# Patient Record
Sex: Female | Born: 1945 | Race: White | Hispanic: No | Marital: Single | Smoking: Never smoker
Health system: Southern US, Community
[De-identification: ages and names within clinical notes are randomized; demographics above are authoritative.]

## PROBLEM LIST (undated history)

## (undated) DIAGNOSIS — K219 Gastro-esophageal reflux disease without esophagitis: Secondary | ICD-10-CM

## (undated) DIAGNOSIS — M1990 Unspecified osteoarthritis, unspecified site: Secondary | ICD-10-CM

## (undated) DIAGNOSIS — E079 Disorder of thyroid, unspecified: Secondary | ICD-10-CM

## (undated) DIAGNOSIS — Z8669 Personal history of other diseases of the nervous system and sense organs: Secondary | ICD-10-CM

## (undated) DIAGNOSIS — I350 Nonrheumatic aortic (valve) stenosis: Secondary | ICD-10-CM

## (undated) DIAGNOSIS — G4733 Obstructive sleep apnea (adult) (pediatric): Secondary | ICD-10-CM

## (undated) DIAGNOSIS — I509 Heart failure, unspecified: Secondary | ICD-10-CM

## (undated) DIAGNOSIS — I10 Essential (primary) hypertension: Secondary | ICD-10-CM

## (undated) DIAGNOSIS — M542 Cervicalgia: Secondary | ICD-10-CM

## (undated) DIAGNOSIS — I059 Rheumatic mitral valve disease, unspecified: Secondary | ICD-10-CM

## (undated) DIAGNOSIS — D649 Anemia, unspecified: Secondary | ICD-10-CM

## (undated) DIAGNOSIS — Z9989 Dependence on other enabling machines and devices: Secondary | ICD-10-CM

## (undated) DIAGNOSIS — E785 Hyperlipidemia, unspecified: Secondary | ICD-10-CM

## (undated) DIAGNOSIS — R51 Headache: Secondary | ICD-10-CM

## (undated) HISTORY — DX: Cervicalgia: M54.2

## (undated) HISTORY — PX: ASCENDING AORTIC ANEURYSM REPAIR W/ TISSUE AORTIC VALVE REPLACEMENT: SHX1193

## (undated) HISTORY — PX: OTHER SURGICAL HISTORY: SHX169

## (undated) HISTORY — DX: Anemia, unspecified: D64.9

## (undated) HISTORY — DX: Headache: R51

## (undated) HISTORY — DX: Unspecified osteoarthritis, unspecified site: M19.90

## (undated) HISTORY — DX: Disorder of thyroid, unspecified: E07.9

## (undated) HISTORY — DX: Heart failure, unspecified: I50.9

## (undated) HISTORY — DX: Nonrheumatic aortic (valve) stenosis: I35.0

## (undated) HISTORY — DX: Hyperlipidemia, unspecified: E78.5

## (undated) HISTORY — DX: Personal history of other diseases of the nervous system and sense organs: Z86.69

## (undated) HISTORY — DX: Gastro-esophageal reflux disease without esophagitis: K21.9

## (undated) HISTORY — DX: Essential (primary) hypertension: I10

## (undated) HISTORY — DX: Obstructive sleep apnea (adult) (pediatric): Z99.89

## (undated) HISTORY — DX: Rheumatic mitral valve disease, unspecified: I05.9

## (undated) HISTORY — DX: Morbid (severe) obesity due to excess calories: E66.01

## (undated) HISTORY — DX: Obstructive sleep apnea (adult) (pediatric): G47.33

## (undated) HISTORY — PX: CHOLECYSTECTOMY: SHX55

---

## 2002-07-23 HISTORY — PX: CARDIAC CATHETERIZATION: SHX172

## 2002-08-23 HISTORY — PX: OTHER SURGICAL HISTORY: SHX169

## 2006-07-23 HISTORY — PX: CARDIAC CATHETERIZATION: SHX172

## 2006-10-03 ENCOUNTER — Ambulatory Visit: Payer: Self-pay | Admitting: Unknown Physician Specialty

## 2007-01-20 HISTORY — PX: OTHER SURGICAL HISTORY: SHX169

## 2007-12-04 ENCOUNTER — Ambulatory Visit: Payer: Self-pay | Admitting: Internal Medicine

## 2009-01-17 ENCOUNTER — Ambulatory Visit: Payer: Self-pay | Admitting: Internal Medicine

## 2009-02-04 ENCOUNTER — Ambulatory Visit: Payer: Self-pay | Admitting: Internal Medicine

## 2010-01-10 ENCOUNTER — Ambulatory Visit: Payer: Self-pay | Admitting: Unknown Physician Specialty

## 2010-12-05 LAB — TSH: TSH: 2.8 u[IU]/mL (ref <=5.90)

## 2011-04-10 LAB — BASIC METABOLIC PANEL
BUN: 16 mg/dL (ref 4–21)
Creatinine: 1 mg/dL (ref 0.5–1.1)

## 2011-04-10 LAB — LIPID PANEL
Cholesterol: 224 mg/dL — AB (ref 0–200)
LDL Cholesterol: 121 mg/dL
Triglycerides: 160 mg/dL (ref 40–160)

## 2011-04-10 LAB — CBC AND DIFFERENTIAL
HCT: 42 % (ref 36–46)
WBC: 7.2 10^3/mL

## 2011-04-10 LAB — POCT INR

## 2011-04-10 LAB — PROTIME-INR: Protime: 23 seconds — AB (ref 10.0–13.8)

## 2011-07-09 ENCOUNTER — Ambulatory Visit: Payer: Self-pay | Admitting: Internal Medicine

## 2011-08-10 ENCOUNTER — Ambulatory Visit: Payer: Self-pay | Admitting: Internal Medicine

## 2011-09-25 ENCOUNTER — Ambulatory Visit: Payer: Self-pay | Admitting: Internal Medicine

## 2011-10-05 ENCOUNTER — Encounter: Payer: Self-pay | Admitting: Internal Medicine

## 2011-10-08 ENCOUNTER — Encounter: Payer: Self-pay | Admitting: Internal Medicine

## 2011-10-08 DIAGNOSIS — G4733 Obstructive sleep apnea (adult) (pediatric): Secondary | ICD-10-CM | POA: Insufficient documentation

## 2011-10-08 DIAGNOSIS — E785 Hyperlipidemia, unspecified: Secondary | ICD-10-CM | POA: Insufficient documentation

## 2011-10-08 DIAGNOSIS — I5031 Acute diastolic (congestive) heart failure: Secondary | ICD-10-CM | POA: Insufficient documentation

## 2011-10-08 DIAGNOSIS — I359 Nonrheumatic aortic valve disorder, unspecified: Secondary | ICD-10-CM | POA: Insufficient documentation

## 2011-10-08 DIAGNOSIS — I059 Rheumatic mitral valve disease, unspecified: Secondary | ICD-10-CM

## 2011-10-08 DIAGNOSIS — I509 Heart failure, unspecified: Secondary | ICD-10-CM

## 2011-10-08 DIAGNOSIS — I10 Essential (primary) hypertension: Secondary | ICD-10-CM | POA: Insufficient documentation

## 2011-10-08 DIAGNOSIS — D649 Anemia, unspecified: Secondary | ICD-10-CM

## 2011-10-08 DIAGNOSIS — R519 Headache, unspecified: Secondary | ICD-10-CM | POA: Insufficient documentation

## 2011-10-08 DIAGNOSIS — G4710 Hypersomnia, unspecified: Secondary | ICD-10-CM

## 2011-10-08 DIAGNOSIS — E039 Hypothyroidism, unspecified: Secondary | ICD-10-CM | POA: Insufficient documentation

## 2011-10-08 DIAGNOSIS — R51 Headache: Secondary | ICD-10-CM

## 2011-11-20 ENCOUNTER — Ambulatory Visit (INDEPENDENT_AMBULATORY_CARE_PROVIDER_SITE_OTHER): Payer: BC Managed Care – PPO | Admitting: Internal Medicine

## 2011-11-20 ENCOUNTER — Encounter: Payer: Self-pay | Admitting: Internal Medicine

## 2011-11-20 ENCOUNTER — Encounter: Payer: Self-pay | Admitting: *Deleted

## 2011-11-20 VITALS — BP 142/76 | HR 65 | Temp 98.4°F | Resp 18 | Ht 67.0 in | Wt 321.2 lb

## 2011-11-20 DIAGNOSIS — R5381 Other malaise: Secondary | ICD-10-CM

## 2011-11-20 DIAGNOSIS — E66813 Obesity, class 3: Secondary | ICD-10-CM

## 2011-11-20 DIAGNOSIS — N951 Menopausal and female climacteric states: Secondary | ICD-10-CM

## 2011-11-20 DIAGNOSIS — Z7901 Long term (current) use of anticoagulants: Secondary | ICD-10-CM

## 2011-11-20 DIAGNOSIS — E785 Hyperlipidemia, unspecified: Secondary | ICD-10-CM

## 2011-11-20 DIAGNOSIS — I059 Rheumatic mitral valve disease, unspecified: Secondary | ICD-10-CM

## 2011-11-20 DIAGNOSIS — Z780 Asymptomatic menopausal state: Secondary | ICD-10-CM

## 2011-11-20 DIAGNOSIS — R5383 Other fatigue: Secondary | ICD-10-CM

## 2011-11-20 DIAGNOSIS — R32 Unspecified urinary incontinence: Secondary | ICD-10-CM

## 2011-11-20 DIAGNOSIS — E039 Hypothyroidism, unspecified: Secondary | ICD-10-CM

## 2011-11-20 DIAGNOSIS — F22 Delusional disorders: Secondary | ICD-10-CM | POA: Insufficient documentation

## 2011-11-20 LAB — LIPID PANEL
Cholesterol: 216 mg/dL — ABNORMAL HIGH (ref 0–200)
HDL: 82.9 mg/dL (ref >39.00)
Triglycerides: 126 mg/dL (ref 0.0–149.0)

## 2011-11-20 LAB — PROTIME-INR
INR: 1.5 ratio — ABNORMAL HIGH (ref 0.8–1.0)
Prothrombin Time: 16.6 s — ABNORMAL HIGH (ref 10.2–12.4)

## 2011-11-20 MED ORDER — SOLIFENACIN SUCCINATE 10 MG PO TABS: 5.0000 mg | ORAL_TABLET | Freq: Every day | ORAL | Status: DC

## 2011-11-20 MED ORDER — DESVENLAFAXINE SUCCINATE ER 50 MG PO TB24: 100.0000 mg | ORAL_TABLET | Freq: Every day | ORAL | Status: DC

## 2011-11-20 MED ORDER — OMEPRAZOLE 20 MG PO CPDR: 20.0000 mg | DELAYED_RELEASE_CAPSULE | Freq: Every day | ORAL | Status: DC

## 2011-11-20 NOTE — Progress Notes (Signed)
Patient ID: Maria Torres, female   DOB: 22-Mar-1946, 66 y.o.   MRN: [id number]  Patient Active Problem List  Diagnoses  . Headache  . Hypothyroidism  . Aortic valve disorder  . Benign hypertension  . Mitral valve disorder  . Anemia  . Hyperlipidemia  . Hypersomnia with sleep apnea  . CHF (congestive heart failure)  . Obesity, Class III, BMI 40-49.9 (morbid obesity)  . Post menopausal syndrome    Subjective:  CC:   Chief Complaint  Patient presents with  . New Patient    HPI:   Maria Reardonis a 66 y.o. female who presents To establish care, transferring from [facility]. .She has many unresolved medical issues including weight gain, Post menopausal syndrome with total loss of libido, which is causing estrangement from longterm boyfriend,  and mild urinary incontinence. She recalls a prior trial of Premarin, not sure why she stopped it.  Has noticed decreased memory since her aortic valve repair and aneurysm repair in 2009 at [facility] .  Mother Myana Schlup died 07-05-2011at home with Hospice  and she has gained 30 lbs since then .   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  History of lap band surgery in 1996 which was unsuccessful, trial stages ,  Done in [place] .  Taking celexa and wellbutrin per Dr.Jeffries for mood disorder , but has taken both ffor over 10 years without a drug holiday.    Past Medical History  Diagnosis Date  . Morbid obesity   . Hx of glaucoma   . Thyroid disease     hypothyrodism  . GERD (gastroesophageal reflux disease)   . Hypertension   . Aortic stenosis   . 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 Obstructive sleep apnea on CPAP   . Anemia   . Headache   . Cervicalgia   . Mitral valve disorder   . Hyperlipidemia   . CHF (congestive heart failure)     Past Surgical History  Procedure Date  .  Cholecystectomy   . Bilateral arthroscopic knee surgery 2001 & 2002  . Gastric banding surgery     @ [place] 04-16-96  . Unilateral total knee replacement 08/2002  . Total left hip replacement 01/25/07  . 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    Review of Systems:   12 Pt  review of systems was negative except those addressed in the HPI,     History   Social History  . Marital Status: Single    Spouse Name: N/A    Number of Children: N/A  . Years of Education: N/A   Occupational History  . Not on file.   Social History Main Topics  . Smoking status: Never Smoker   . Smokeless tobacco: Never Used  . Alcohol Use: Yes  . Drug Use: No  . Sexually Active: Not on file   Other Topics Concern  . Not on file   Social History Narrative  . No narrative on file    Objective:  BP 142/76  Pulse 65  Temp(Src) 98.4 F (36.9 C) (Oral)  Resp 18  Ht 5\' 7"  (1.702 m)  Wt  321 lb 4 oz (145.718 kg)  BMI 50.31 kg/m2  SpO2 97%  General appearance: alert, cooperative and appears stated age Ears: normal TM's and external ear canals both ears Throat: lips, mucosa, and tongue normal; teeth and gums normal Neck: no adenopathy, no carotid bruit, supple, symmetrical, trachea midline and thyroid not enlarged, symmetric, no tenderness/mass/nodules Back: symmetric, no curvature. ROM normal. No CVA tenderness. Lungs: clear to auscultation bilaterally Heart: regular rate and rhythm, S1, S2 normal, no murmur, click, rub or gallop Abdomen: soft, non-tender; bowel sounds normal; no masses,  no organomegaly Pulses: 2+ and symmetric Skin: Skin color, texture, turgor normal. No rashes or lesions Lymph nodes: Cervical, supraclavicular, and axillary nodes normal.  Assessment and Plan:  Mitral valve  disorder Benign, currently asymptomatic.   Obesity, Class III, BMI 40-49.9 (morbid obesity) I have addressed  BMI and recommended a low glycemic index diet utilizing smaller more frequent meals to increase metabolism.  I have also recommended that patient start exercising with a goal of 30 minutes of aerobic exercise a minimum of 5 days per week. Screening for lipid disorders, thyroid and diabetes to be done today. 30 minutes spent addressing diet, lifestyle and exercise .     Post menopausal syndrome Discussed current symptoms.  Current therapy.  Would avoid HRT in her given her BMI and increased risk for pulmonary embolism . Discussed weaning off of celexa and trial of pristiq.  Samples of 50 mg x 2 weeks given.     Updated Medication List Outpatient Encounter Prescriptions as of 11/20/2011  Medication Sig Dispense Refill  . ALPRAZolam (XANAX) 0.25 MG tablet Take 0.25 mg by mouth at bedtime as needed.      Marland Kitchen amLODipine (NORVASC) 10 MG tablet Take 10 mg by mouth daily.      Marland Kitchen amoxicillin (AMOXIL) 250 MG capsule Take 250 mg by mouth 3 (three) times daily. Before dental procedures      . buPROPion (WELLBUTRIN XL) 300 MG 24 hr tablet Take 300 mg by mouth daily.      . citalopram (CELEXA) 40 MG tablet Take 40 mg by mouth daily.      . hydrochlorothiazide (HYDRODIURIL) 25 MG tablet Take 25 mg by mouth daily.      Marland Kitchen levothyroxine (SYNTHROID) 125 MCG tablet Take 125 mcg by mouth daily.      Marland Kitchen omeprazole (PRILOSEC) 20 MG capsule Take 1 capsule (20 mg total) by mouth daily.  30 capsule  3  . solifenacin (VESICARE) 10 MG tablet Take 0.5 tablets (5 mg total) by mouth daily.  30 tablet  3  . traMADol (ULTRAM) 50 MG tablet Take 50 mg by mouth every 6 (six) hours as needed.      . warfarin (COUMADIN) 10 MG tablet Take 10 mg by mouth daily.      Marland Kitchen warfarin (COUMADIN) 7.5 MG tablet Take 7.5 mg by mouth daily. 7.5 mg 4 times a week, 10 mg 3 times a week      . zolpidem (AMBIEN) 5 MG tablet Take 5 mg by  mouth at bedtime as needed.      Marland Kitchen DISCONTD: omeprazole (PRILOSEC) 20 MG capsule Take 20 mg by mouth daily.      Marland Kitchen DISCONTD: solifenacin (VESICARE) 10 MG tablet Take 5 mg by mouth daily.      Marland Kitchen desvenlafaxine (PRISTIQ) 50 MG 24 hr tablet Take 2 tablets (100 mg total) by mouth daily.  30 tablet  11  . DISCONTD: docusate sodium (COLACE) 100 MG capsule  Take 100 mg by mouth 2 (two) times daily.      Marland Kitchen DISCONTD: oxyCODONE-acetaminophen (PERCOCET) 5-325 MG per tablet Take 1 tablet by mouth every 4 (four) hours as needed.         Orders Placed This Encounter  Procedures  . TSH  . Lipid panel  . COMPLETE METABOLIC PANEL WITH GFR  . INR/PT    No Follow-up on file.

## 2011-11-20 NOTE — Assessment & Plan Note (Signed)
Benign, currently asymptomatic.

## 2011-11-20 NOTE — Patient Instructions (Addendum)
Consider the Low Glycemic Index Diet and eating 6 smaller meals daily .  This boosts your metabolism and regulates your sugars:   7 AM Low carbohydrate Protein  Shakes (EAS Carb Control  Or Atkins ,  Available everywhere,   In  cases at [employer] )  2.5 carbs  (Add or substitute a toasted sandwhich thin w/ peanut butter)  10 AM: Protein bar by Atkins (snack size,  Chocolate lover's variety at  [employer])    Lunch: sandwich on pita bread or flatbread (Joseph's makes a pita bread and a flat bread , available at [employer] and [employer]; Toufayah makes a low carb flatbread available at [employer] and [employer]) Mission makes a low carb whole wheat tortilla available at [employer] most grocery stores   3 PM:  Mid day :  Another protein bar,  Or a  cheese stick, 1/4 cup of almonds, walnuts, pistachios, pecans, peanuts,  Macadamia nuts  6 PM  Dinner:  "mean and green:"  Meat/chicken/fish, salad, and green veggie : use ranch, vinagrette,  Blue cheese, etc  9 PM snack : Breyer's low carb fudgsicle or  ice cream bar (Carb Smart), or  Weight Watcher's ice cream bar , or another protein shake  Hearts of palm,  chobani greek yogurt ,  Carnitas (pork based ,  [employer]Js deli section), rotisserie chicken.    Decrease celexa to 20 mg daily ,  After one week start Pristiq at 50  mg daily and after one week stop the celexa  And continue pristiq at 50 mg daily

## 2011-11-20 NOTE — Assessment & Plan Note (Signed)
I have addressed  BMI and recommended a low glycemic index diet utilizing smaller more frequent meals to increase metabolism.  I have also recommended that patient start exercising with a goal of 30 minutes of aerobic exercise a minimum of 5 days per week. Screening for lipid disorders, thyroid and diabetes to be done today. 30 minutes spent addressing diet, lifestyle and exercise .

## 2011-11-20 NOTE — Assessment & Plan Note (Signed)
Discussed current symptoms.  Current therapy.  Would avoid HRT in her given her BMI and increased risk for pulmonary embolism . Discussed weaning off of celexa and trial of pristiq.  Samples of 50 mg x 2 weeks given.

## 2011-11-21 ENCOUNTER — Encounter: Payer: Self-pay | Admitting: Internal Medicine

## 2011-11-21 LAB — LDL CHOLESTEROL, DIRECT: Direct LDL: 122.8 mg/dL

## 2011-11-21 MED ORDER — LEVOTHYROXINE SODIUM 137 MCG PO TABS: 137.0000 ug | ORAL_TABLET | Freq: Every day | ORAL | Status: DC

## 2011-11-21 NOTE — Progress Notes (Signed)
Addended by: Duncan Dull on: 11/21/2011 01:31 PM   Modules accepted: Orders

## 2011-11-22 LAB — COMPLETE METABOLIC PANEL WITH GFR
CO2: 27 mEq/L (ref 19–32)
Creat: 1.01 mg/dL (ref 0.50–1.10)
GFR, Est African American: 68 mL/min
GFR, Est Non African American: 59 mL/min — ABNORMAL LOW
Glucose, Bld: 88 mg/dL (ref 70–99)
Total Bilirubin: 0.5 mg/dL (ref 0.3–1.2)

## 2011-12-03 ENCOUNTER — Encounter: Payer: Self-pay | Admitting: Internal Medicine

## 2011-12-06 ENCOUNTER — Encounter: Payer: Self-pay | Admitting: Internal Medicine

## 2011-12-06 DIAGNOSIS — F329 Major depressive disorder, single episode, unspecified: Secondary | ICD-10-CM

## 2011-12-10 MED ORDER — DESVENLAFAXINE SUCCINATE ER 100 MG PO TB24: 100.0000 mg | ORAL_TABLET | Freq: Every day | ORAL | Status: DC

## 2011-12-11 ENCOUNTER — Telehealth: Payer: Self-pay | Admitting: Internal Medicine

## 2011-12-11 NOTE — Telephone Encounter (Signed)
Pt called   [facility] in [place] spings  [phone] The have sent prior aut on her rx pristic pt would like to get 100mg  instead of 50mg . Pt is completely out of meds. Please advise  Pt found her lost rx but [facility]  Pt canceled this prescription

## 2011-12-12 ENCOUNTER — Telehealth: Payer: Self-pay | Admitting: Internal Medicine

## 2011-12-12 MED ORDER — VENLAFAXINE HCL ER 75 MG PO CP24: 75.0000 mg | ORAL_CAPSULE | Freq: Every day | ORAL | Status: DC

## 2011-12-12 NOTE — Telephone Encounter (Signed)
We have to do a prior authorization for patients Pristiq, I just got this request yesterday afternoon.  Patient called and stated she is completley out of the medication and she needs something today.  She stated you tapered her off citalopram to go the Pristiq.  Please advise if you want to switch her to something else.

## 2011-12-12 NOTE — Telephone Encounter (Signed)
Patient notified

## 2011-12-12 NOTE — Telephone Encounter (Signed)
See other note

## 2011-12-12 NOTE — Telephone Encounter (Signed)
rx for effexor sent to pharmacy for one month while we wait for pristiq quthorization

## 2011-12-21 ENCOUNTER — Encounter: Payer: BC Managed Care – PPO | Admitting: Internal Medicine

## 2011-12-24 ENCOUNTER — Other Ambulatory Visit: Payer: Self-pay | Admitting: Internal Medicine

## 2011-12-25 ENCOUNTER — Telehealth: Payer: Self-pay | Admitting: Internal Medicine

## 2011-12-25 NOTE — Telephone Encounter (Signed)
Received notification patient needs prior authorization on Pristiq, form is in red folder to be signed.

## 2012-01-02 NOTE — Telephone Encounter (Signed)
Pristiq has been approved by insurance.  Patient and pharmacy notified.

## 2012-01-03 ENCOUNTER — Other Ambulatory Visit: Payer: Self-pay | Admitting: Internal Medicine

## 2012-01-10 ENCOUNTER — Other Ambulatory Visit: Payer: Self-pay | Admitting: Internal Medicine

## 2012-01-10 MED ORDER — HYDROCHLOROTHIAZIDE 25 MG PO TABS: 25.0000 mg | ORAL_TABLET | Freq: Every day | ORAL | Status: DC

## 2012-01-25 ENCOUNTER — Encounter: Payer: BC Managed Care – PPO | Admitting: Internal Medicine

## 2012-03-03 ENCOUNTER — Other Ambulatory Visit: Payer: Self-pay | Admitting: Internal Medicine

## 2012-03-05 ENCOUNTER — Other Ambulatory Visit: Payer: Self-pay | Admitting: Internal Medicine

## 2012-03-20 ENCOUNTER — Telehealth: Payer: Self-pay | Admitting: Internal Medicine

## 2012-03-20 NOTE — Telephone Encounter (Signed)
Caller: Camisha/Patient; Patient Name: Maria Torres; PCP: Duncan Dull (Adults only); Best Callback Phone Number: [phone] Onset- 2weeks per pt she has had a headache/migraine. Afebrile. She saw her Opthamologist (Dr. 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 She was not given anything for migraine. She had an Imitrex left but that did not help. She does have nausea with headache. Emergent s/s of Headache protocol r/o. Pt to see provider within 24hrs. Appointment scheduled with Dr. Dan Humphreys for tomorrow at 1:15pm. No appointments available with Dr. Darrick Huntsman.

## 2012-03-21 ENCOUNTER — Encounter: Payer: Self-pay | Admitting: Internal Medicine

## 2012-03-21 ENCOUNTER — Ambulatory Visit (INDEPENDENT_AMBULATORY_CARE_PROVIDER_SITE_OTHER): Payer: BC Managed Care – PPO | Admitting: Internal Medicine

## 2012-03-21 VITALS — BP 114/72 | HR 80 | Temp 98.0°F | Ht 67.0 in | Wt 321.5 lb

## 2012-03-21 DIAGNOSIS — R51 Headache: Secondary | ICD-10-CM

## 2012-03-21 DIAGNOSIS — Z7901 Long term (current) use of anticoagulants: Secondary | ICD-10-CM

## 2012-03-21 DIAGNOSIS — G43909 Migraine, unspecified, not intractable, without status migrainosus: Secondary | ICD-10-CM

## 2012-03-21 LAB — PROTIME-INR: Prothrombin Time: 24 s — ABNORMAL HIGH (ref 10.2–12.4)

## 2012-03-21 MED ORDER — PREDNISONE (PAK) 10 MG PO TABS: ORAL_TABLET | ORAL | Status: AC

## 2012-03-21 MED ORDER — PROMETHAZINE HCL 12.5 MG PO TABS: 12.5000 mg | ORAL_TABLET | Freq: Three times a day (TID) | ORAL | Status: DC | PRN

## 2012-03-21 MED ORDER — TOPIRAMATE 25 MG PO TABS: 25.0000 mg | ORAL_TABLET | Freq: Two times a day (BID) | ORAL | Status: DC

## 2012-03-21 MED ORDER — ZOLMITRIPTAN 5 MG NA SOLN: 1.0000 | NASAL | Status: DC | PRN

## 2012-03-21 MED ORDER — METHYLPREDNISOLONE SODIUM SUCC 125 MG IJ SOLR
62.5000 mg | Freq: Once | INTRAMUSCULAR | Status: AC
  Administered 2012-03-21: 62.5 mg via INTRAMUSCULAR

## 2012-03-21 NOTE — Patient Instructions (Signed)
Start Topamax tonight 25mg  every night today.  Start Prednisone taper back tomorrow.  Follow up 1-2 weeks.

## 2012-03-22 NOTE — Assessment & Plan Note (Signed)
Symptoms consistent with migraine. Symptoms improved with Zomig, but not completely resolved. Given that symptoms have been persistent for 2 weeks, will plan to try course of prednisone in effort to abort headache. Will also start Topamax as preventative measure. Follow up 2 weeks or sooner as needed.

## 2012-03-22 NOTE — Progress Notes (Signed)
Subjective:    Patient ID: Maria Torres, female    DOB: 07/24/1945, 66 y.o.   MRN: [id number]  HPI 66YO female with h/o migraines presents with 2 week h/o headache. Symptoms are consistent with previous migraines. Diffuse headache pain, aura, photo and phonophobia, nausea. Symptoms have not responded to OTC meds. Pt used immitrex in the past, but has not recently used this medication.   Outpatient Encounter Prescriptions as of 03/21/2012  Medication Sig Dispense Refill  . ALPRAZolam (XANAX) 0.25 MG tablet Take 0.25 mg by mouth at bedtime as needed.      Marland Kitchen amLODipine (NORVASC) 10 MG tablet take 1 tablet by mouth once daily  90 tablet  3  . amoxicillin (AMOXIL) 250 MG capsule Take 250 mg by mouth 3 (three) times daily. Before dental procedures      . buPROPion (WELLBUTRIN XL) 300 MG 24 hr tablet take 1 tablet by mouth once daily  90 tablet  3  . desvenlafaxine (PRISTIQ) 100 MG 24 hr tablet Take 1 tablet (100 mg total) by mouth daily.  30 tablet  11  . hydrochlorothiazide (HYDRODIURIL) 25 MG tablet Take 1 tablet (25 mg total) by mouth daily.  90 tablet  2  . levothyroxine (SYNTHROID) 137 MCG tablet Take 1 tablet (137 mcg total) by mouth daily.  30 tablet  5  . omeprazole (PRILOSEC) 20 MG capsule Take 1 capsule (20 mg total) by mouth daily.  30 capsule  3  . solifenacin (VESICARE) 10 MG tablet Take 0.5 tablets (5 mg total) by mouth daily.  30 tablet  3  . traMADol (ULTRAM) 50 MG tablet Take 50 mg by mouth every 6 (six) hours as needed.      . warfarin (COUMADIN) 10 MG tablet TAKE 1 TABLET BY MOUTH AS DIRECTED.  60 tablet  6  . warfarin (COUMADIN) 7.5 MG tablet Take 7.5 mg by mouth daily. 7.5 mg 4 times a week, 10 mg 3 times a week      . predniSONE (STERAPRED UNI-PAK) 10 MG tablet Take 60mg  day 1 then taper by 10mg  daily  21 tablet  0  . promethazine (PHENERGAN) 12.5 MG tablet Take 1 tablet (12.5 mg total) by mouth every 8 (eight) hours as needed for nausea.  20 tablet  0  . topiramate  (TOPAMAX) 25 MG tablet Take 1 tablet (25 mg total) by mouth 2 (two) times daily.  30 tablet  3  . zolmitriptan (ZOMIG) 5 MG nasal solution Place 1 spray into the nose as needed for migraine.  6 Units  0  . zolpidem (AMBIEN) 5 MG tablet Take 5 mg by mouth at bedtime as needed.      Marland Kitchen DISCONTD: citalopram (CELEXA) 40 MG tablet Take 40 mg by mouth daily.      Marland Kitchen DISCONTD: venlafaxine XR (EFFEXOR XR) 75 MG 24 hr capsule Take 1 capsule (75 mg total) by mouth daily.  30 capsule  1   Facility-Administered Encounter Medications as of 03/21/2012  Medication Dose Route Frequency Provider Last Rate Last Dose  . methylPREDNISolone sodium succinate (SOLU-MEDROL) 125 mg/2 mL injection 62.5 mg  62.5 mg Intramuscular Once Shelia Media, MD   62.5 mg at 03/21/12 1535   BP 114/72  Pulse 80  Temp 98 F (36.7 C) (Oral)  Ht 5\' 7"  (1.702 m)  Wt 321 lb 8 oz (145.831 kg)  BMI 50.35 kg/m2  SpO2 96%    Review of Systems  Constitutional: Negative for fever, chills, appetite change, fatigue and  unexpected weight change.  HENT: Negative for ear pain, congestion, sore throat, trouble swallowing, neck pain, voice change and sinus pressure.   Eyes: Positive for photophobia. Negative for visual disturbance.  Respiratory: Negative for cough, shortness of breath, wheezing and stridor.   Cardiovascular: Negative for chest pain, palpitations and leg swelling.  Gastrointestinal: Positive for nausea. Negative for vomiting, abdominal pain, diarrhea, constipation, blood in stool, abdominal distention and anal bleeding.  Genitourinary: Negative for dysuria and flank pain.  Musculoskeletal: Negative for myalgias, arthralgias and gait problem.  Skin: Negative for color change and rash.  Neurological: Positive for headaches. Negative for dizziness, tremors, speech difficulty, weakness and numbness.  Hematological: Negative for adenopathy. Does not bruise/bleed easily.  Psychiatric/Behavioral: Negative for suicidal ideas,  disturbed wake/sleep cycle and dysphoric mood. The patient is not nervous/anxious.        Objective:   Physical Exam  Constitutional: She is oriented to person, place, and time. She appears well-developed and well-nourished. No distress.  HENT:  Head: Normocephalic and atraumatic.  Right Ear: External ear normal.  Left Ear: External ear normal.  Nose: Nose normal.  Mouth/Throat: Oropharynx is clear and moist. No oropharyngeal exudate.  Eyes: Conjunctivae are normal. Pupils are equal, round, and reactive to light. Right eye exhibits no discharge. Left eye exhibits no discharge. No scleral icterus.  Neck: Normal range of motion. Neck supple. No tracheal deviation present. No thyromegaly present.  Cardiovascular: Normal rate, regular rhythm, normal heart sounds and intact distal pulses.  Exam reveals no gallop and no friction rub.   No murmur heard. Pulmonary/Chest: Effort normal and breath sounds normal. No respiratory distress. She has no wheezes. She has no rales. She exhibits no tenderness.  Musculoskeletal: Normal range of motion. She exhibits no edema and no tenderness.  Lymphadenopathy:    She has no cervical adenopathy.  Neurological: She is alert and oriented to person, place, and time. No cranial nerve deficit. She exhibits normal muscle tone. Coordination normal.  Skin: Skin is warm and dry. No rash noted. She is not diaphoretic. No erythema. No pallor.  Psychiatric: She has a normal mood and affect. Her behavior is normal. Judgment and thought content normal.          Assessment & Plan:

## 2012-03-26 ENCOUNTER — Telehealth: Payer: Self-pay | Admitting: Internal Medicine

## 2012-03-26 NOTE — Telephone Encounter (Signed)
Caller: Cythnia/Patient; Patient Name: Maria Torres; PCP: Duncan Dull (Adults only); Best Callback Phone Number: [phone].  Call regarding Insomnia after starting Prednisone for Migraines on 8-30. Patient isn't able to sleep during the night but is sleeping thru the day, unable to work.   All emergent symptoms ruled out per Sleep Disorders Protocol, see in 24 hours, due to sleep deprivation and irritable.  Appointment scheduled on 9-5 at 1130.  Patient verbalized understanding.

## 2012-03-27 ENCOUNTER — Encounter: Payer: Self-pay | Admitting: Internal Medicine

## 2012-03-27 ENCOUNTER — Ambulatory Visit (INDEPENDENT_AMBULATORY_CARE_PROVIDER_SITE_OTHER): Payer: BC Managed Care – PPO | Admitting: Internal Medicine

## 2012-03-27 VITALS — BP 132/82 | HR 82 | Temp 98.0°F | Ht 67.0 in | Wt 317.5 lb

## 2012-03-27 DIAGNOSIS — G43909 Migraine, unspecified, not intractable, without status migrainosus: Secondary | ICD-10-CM

## 2012-03-27 MED ORDER — TOPIRAMATE 50 MG PO TABS: 50.0000 mg | ORAL_TABLET | Freq: Two times a day (BID) | ORAL | Status: DC

## 2012-03-27 MED ORDER — ELETRIPTAN HYDROBROMIDE 40 MG PO TABS: 40.0000 mg | ORAL_TABLET | ORAL | Status: DC | PRN

## 2012-03-27 NOTE — Progress Notes (Signed)
Subjective:    Patient ID: Maria Torres, female    DOB: 1946/02/04, 66 y.o.   MRN: [id number]  HPI 66 year old female with history of migraine headaches presents for acute visit complaining of persistent migraine and insomnia. She was seen in clinic last week with severe migraine and was treated with Zomig with minimal improvement. She was then started on a prednisone taper to help break recurrent migraine headaches. She reports some improvement with this but has had difficulty sleeping. She reports that yesterday she did not fall asleep until after 5 in the morning. She then overslept her work and slept until 2 in the afternoon. She reports persistent diffuse headache with photo and phonophobia. She also has intermittent nausea. She was also started on Topamax but denies any improvement with this medication.  Outpatient Encounter Prescriptions as of 03/27/2012  Medication Sig Dispense Refill  . ALPRAZolam (XANAX) 0.25 MG tablet Take 0.25 mg by mouth at bedtime as needed.      Marland Kitchen amLODipine (NORVASC) 10 MG tablet take 1 tablet by mouth once daily  90 tablet  3  . amoxicillin (AMOXIL) 250 MG capsule Take 250 mg by mouth 3 (three) times daily. Before dental procedures      . buPROPion (WELLBUTRIN XL) 300 MG 24 hr tablet take 1 tablet by mouth once daily  90 tablet  3  . desvenlafaxine (PRISTIQ) 100 MG 24 hr tablet Take 1 tablet (100 mg total) by mouth daily.  30 tablet  11  . hydrochlorothiazide (HYDRODIURIL) 25 MG tablet Take 1 tablet (25 mg total) by mouth daily.  90 tablet  2  . levothyroxine (SYNTHROID) 137 MCG tablet Take 1 tablet (137 mcg total) by mouth daily.  30 tablet  5  . omeprazole (PRILOSEC) 20 MG capsule Take 1 capsule (20 mg total) by mouth daily.  30 capsule  3  . predniSONE (STERAPRED UNI-PAK) 10 MG tablet Take 60mg  day 1 then taper by 10mg  daily  21 tablet  0  . promethazine (PHENERGAN) 12.5 MG tablet Take 1 tablet (12.5 mg total) by mouth every 8 (eight) hours as needed for  nausea.  20 tablet  0  . solifenacin (VESICARE) 10 MG tablet Take 0.5 tablets (5 mg total) by mouth daily.  30 tablet  3  . topiramate (TOPAMAX) 50 MG tablet Take 1 tablet (50 mg total) by mouth 2 (two) times daily.  30 tablet  3  . traMADol (ULTRAM) 50 MG tablet Take 50 mg by mouth every 6 (six) hours as needed.      . warfarin (COUMADIN) 10 MG tablet TAKE 1 TABLET BY MOUTH AS DIRECTED.  60 tablet  6  . warfarin (COUMADIN) 7.5 MG tablet Take 7.5 mg by mouth daily. 7.5 mg 4 times a week, 10 mg 3 times a week      . zolmitriptan (ZOMIG) 5 MG nasal solution Place 1 spray into the nose as needed for migraine.  6 Units  0  . zolpidem (AMBIEN) 5 MG tablet Take 5 mg by mouth at bedtime as needed.      Marland Kitchen DISCONTD: topiramate (TOPAMAX) 25 MG tablet Take 1 tablet (25 mg total) by mouth 2 (two) times daily.  30 tablet  3  . eletriptan (RELPAX) 40 MG tablet One tablet by mouth at onset of headache. May repeat in 2 hours if headache persists or recurs. may repeat in 2 hours if necessary  10 tablet  0   BP 132/82  Pulse 82  Temp 98 F (  36.7 C) (Oral)  Ht 5\' 7"  (1.702 m)  Wt 317 lb 8 oz (144.017 kg)  BMI 49.73 kg/m2  SpO2 98%  Review of Systems  Constitutional: Positive for fatigue. Negative for fever, chills, appetite change and unexpected weight change.  HENT: Negative for ear pain, congestion, sore throat, trouble swallowing, neck pain, voice change and sinus pressure.   Eyes: Positive for photophobia. Negative for visual disturbance.  Respiratory: Negative for cough, shortness of breath, wheezing and stridor.   Cardiovascular: Negative for chest pain, palpitations and leg swelling.  Gastrointestinal: Positive for nausea. Negative for vomiting, abdominal pain, diarrhea, constipation, blood in stool, abdominal distention and anal bleeding.  Genitourinary: Negative for dysuria and flank pain.  Musculoskeletal: Negative for myalgias, arthralgias and gait problem.  Skin: Negative for color change and  rash.  Neurological: Positive for headaches. Negative for dizziness.  Hematological: Negative for adenopathy. Does not bruise/bleed easily.  Psychiatric/Behavioral: Positive for disturbed wake/sleep cycle. Negative for suicidal ideas and dysphoric mood. The patient is not nervous/anxious.        Objective:   Physical Exam  Constitutional: She is oriented to person, place, and time. She appears well-developed and well-nourished. No distress (sitting in dark room with sunglasses on).  HENT:  Head: Normocephalic and atraumatic.  Right Ear: External ear normal.  Left Ear: External ear normal.  Nose: Nose normal.  Mouth/Throat: Oropharynx is clear and moist.  Eyes: Conjunctivae are normal. Pupils are equal, round, and reactive to light. Right eye exhibits no discharge. Left eye exhibits no discharge. No scleral icterus.  Neck: Normal range of motion. Neck supple. No tracheal deviation present. No thyromegaly present.  Pulmonary/Chest: Effort normal.  Musculoskeletal: Normal range of motion. She exhibits no edema and no tenderness.  Lymphadenopathy:    She has no cervical adenopathy.  Neurological: She is alert and oriented to person, place, and time. No cranial nerve deficit. She exhibits normal muscle tone. Coordination normal.  Skin: Skin is warm and dry. No rash noted. She is not diaphoretic. No erythema. No pallor.  Psychiatric: She has a normal mood and affect. Her behavior is normal. Judgment and thought content normal.          Assessment & Plan:

## 2012-03-27 NOTE — Assessment & Plan Note (Addendum)
Recurrent migraine headaches despite use of prednisone taper (however note that pt was able to function and participate at work earlier this week). Will increase Topamax to 50 mg daily and then she will advance to twice daily next week. We will set up referral to headache clinic at [facility]. Will also try using Relpax to see if any improvement in acute symptoms. Followup with primary care physician next week. Will try to get notes from ophthalmologist in regards to recent eye exam and from [facility] as to previous MRI brain. If symptoms are persistent or worsening, pt will call. Would have low threshold to repeat MRI brain if symptoms persist.

## 2012-03-31 ENCOUNTER — Encounter: Payer: Self-pay | Admitting: Internal Medicine

## 2012-03-31 ENCOUNTER — Other Ambulatory Visit: Payer: Self-pay | Admitting: Internal Medicine

## 2012-04-04 ENCOUNTER — Encounter: Payer: BC Managed Care – PPO | Admitting: Internal Medicine

## 2012-04-16 ENCOUNTER — Other Ambulatory Visit: Payer: Self-pay | Admitting: Internal Medicine

## 2012-04-16 DIAGNOSIS — E039 Hypothyroidism, unspecified: Secondary | ICD-10-CM

## 2012-04-16 MED ORDER — LEVOTHYROXINE SODIUM 137 MCG PO TABS: 137.0000 ug | ORAL_TABLET | Freq: Every day | ORAL | Status: DC

## 2012-05-05 ENCOUNTER — Encounter: Payer: BC Managed Care – PPO | Admitting: Internal Medicine

## 2012-05-08 ENCOUNTER — Other Ambulatory Visit: Payer: Self-pay | Admitting: Internal Medicine

## 2012-05-30 ENCOUNTER — Encounter: Payer: BC Managed Care – PPO | Admitting: Internal Medicine

## 2012-06-26 ENCOUNTER — Other Ambulatory Visit: Payer: Self-pay | Admitting: Internal Medicine

## 2012-07-11 ENCOUNTER — Ambulatory Visit: Payer: Self-pay | Admitting: Internal Medicine

## 2012-07-11 ENCOUNTER — Ambulatory Visit (INDEPENDENT_AMBULATORY_CARE_PROVIDER_SITE_OTHER): Payer: BC Managed Care – PPO | Admitting: Internal Medicine

## 2012-07-11 ENCOUNTER — Encounter: Payer: Self-pay | Admitting: Internal Medicine

## 2012-07-11 VITALS — BP 140/72 | HR 76 | Temp 98.2°F | Resp 12 | Ht 68.0 in | Wt 330.5 lb

## 2012-07-11 DIAGNOSIS — I509 Heart failure, unspecified: Secondary | ICD-10-CM

## 2012-07-11 DIAGNOSIS — Z13220 Encounter for screening for lipoid disorders: Secondary | ICD-10-CM

## 2012-07-11 DIAGNOSIS — Z954 Presence of other heart-valve replacement: Secondary | ICD-10-CM

## 2012-07-11 DIAGNOSIS — R079 Chest pain, unspecified: Secondary | ICD-10-CM

## 2012-07-11 DIAGNOSIS — G4710 Hypersomnia, unspecified: Secondary | ICD-10-CM

## 2012-07-11 DIAGNOSIS — R0689 Other abnormalities of breathing: Secondary | ICD-10-CM

## 2012-07-11 DIAGNOSIS — Z952 Presence of prosthetic heart valve: Secondary | ICD-10-CM

## 2012-07-11 DIAGNOSIS — I359 Nonrheumatic aortic valve disorder, unspecified: Secondary | ICD-10-CM

## 2012-07-11 DIAGNOSIS — R0609 Other forms of dyspnea: Secondary | ICD-10-CM

## 2012-07-11 LAB — COMPREHENSIVE METABOLIC PANEL
ALT: 13 U/L (ref 0–35)
AST: 15 U/L (ref 0–37)
Alkaline Phosphatase: 50 U/L (ref 39–117)
CO2: 27 mEq/L (ref 19–32)
GFR: 55.9 mL/min — ABNORMAL LOW (ref >60.00)
Sodium: 141 mEq/L (ref 135–145)
Total Bilirubin: 0.8 mg/dL (ref 0.3–1.2)
Total Protein: 7.1 g/dL (ref 6.0–8.3)

## 2012-07-11 LAB — LIPID PANEL
Cholesterol: 192 mg/dL (ref 0–200)
Triglycerides: 63 mg/dL (ref 0.0–149.0)

## 2012-07-11 MED ORDER — FUROSEMIDE 20 MG PO TABS: ORAL_TABLET | ORAL | Status: DC

## 2012-07-11 NOTE — Patient Instructions (Addendum)
Resume coumadin at 10 mg daily for 3 days,  Then alternate between 10 mg and 7 .5 mg and recheck coumadin level in 2 weeks.   Please go get a chest x ray today   Cardiology referral underway  [facility] will contact you to set up the home INR testing

## 2012-07-11 NOTE — Progress Notes (Signed)
Patient ID: Maria Torres, female   DOB: 11/02/1945, 66 y.o.   MRN: [id number]  Patient Active Problem List  Diagnosis  . Headache  . Hypothyroidism  . Aortic valve disorder  . Benign hypertension  . Mitral valve disorder  . Anemia  . Hyperlipidemia  . Hypersomnia with sleep apnea  . CHF (congestive heart failure)  . Obesity, Class III, BMI 40-49.9 (morbid obesity)  . Post menopausal syndrome  . Migraine    Subjective:  CC:   Chief Complaint  Patient presents with  . Chest Pain    HPI:   Maria Reardonis a 66 y.o. female who presents the chief complaint of "i'm having a hard time breathing." Patient states that her symptoms start when she wakes up and are aggravated by just moving around. They have been occurring  for several weeks.  Sleeps sitting up regularly because of low back pain  But is more short of breath with supine position.  She has gained 30 lbs  over the past year and is now 330 lbs.  she denies chest pain.   she has had no recent travel.  Had some cold symptoms which predated the shortness of breath.   she has been told that she gurgles when she sleeps.  she has a history of obstructive sleep apnea but is not using her CPAP but has not done so for years. She has a history of aortic valve repair/replacement and has not taken her Coumadin in over a week. 2) She is also having groin pain on the left ,   which is aggravated by  prolonged standing  And stepping up.  She she climbs stairs by leading with right leg.   she had a left hip replacement 2008.    Past Medical History  Diagnosis Date  . Morbid obesity   . Hx of glaucoma   . Thyroid disease     hypothyrodism  . GERD (gastroesophageal reflux disease)   . Hypertension   . Aortic stenosis   . 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 Obstructive sleep apnea on CPAP   . Anemia   . Headache   . Cervicalgia   . Mitral valve disorder   . Hyperlipidemia   . CHF (congestive heart failure)     Past  Surgical History  Procedure Date  . Cholecystectomy   . Bilateral arthroscopic knee surgery 2001 & 2002  . Gastric banding surgery     @ [place] 04-16-96  . Unilateral total knee replacement 08/2002  . Total left hip replacement 01-20-2007  . 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    Review of Systems:   Patient denies headache, fevers, malaise, unintentional weight loss, skin rash, eye pain, sinus congestion and sinus pain, sore throat, dysphagia,  hemoptysis , cough, wheezing, chest pain, palpitations,   abdominal pain, nausea, melena, diarrhea, constipation, flank pain, dysuria, hematuria, urinary  Frequency, nocturia, numbness, tingling, seizures,  Focal weakness, Loss of consciousness,  Tremor, and suicidal ideation.        History   Social History  . Marital Status: Single    Spouse Name: N/A    Number of Children: N/A  . Years of Education: N/A   Occupational History  . Not on file.   Social History Main Topics  .  Smoking status: Never Smoker   . Smokeless tobacco: Never Used  . Alcohol Use: Yes  . Drug Use: No  . Sexually Active: Not on file   Other Topics Concern  . Not on file   Social History Narrative  . No narrative on file    Objective:  BP 140/72  Pulse 76  Temp 98.2 F (36.8 C) (Oral)  Resp 12  Ht 5\' 8"  (1.727 m)  Wt 330 lb 8 oz (149.914 kg)  BMI 50.25 kg/m2  SpO2 97%  General appearance: alert, cooperative and appears stated age Ears: normal TM's and external ear canals both ears Throat: lips, mucosa, and tongue normal; teeth and gums normal Neck: no adenopathy, no carotid bruit, supple, symmetrical, trachea midline and thyroid not enlarged, symmetric, no tenderness/mass/nodules Back: symmetric, no curvature. ROM normal. No CVA tenderness. Lungs: clear  to auscultation bilaterally Heart: regular rate and rhythm, S1, S2 normal, no murmur, click, rub or gallop Abdomen: soft, non-tender; bowel sounds normal; no masses,  no organomegaly Pulses: 2+ and symmetric Skin: Skin color, texture, turgor normal. No rashes or lesions Lymph nodes: Cervical, supraclavicular, and axillary nodes normal.  Assessment and Plan:  Aortic valve disorder She has a history of aortic aneurysm repair and tissue aortic valve replacement done at [facility] several years ago but has not had cardiology followup and has not been taking her Coumadin for the last week. I've asked her to resume it at 10 mg daily and to followup with a PT/INR in one week. Referral to local cardiology for followup.  CHF (congestive heart failure) Ideology unclear as I did not receive records from [place] , [place] unknown. She has untreated sleep apnea so most likely has pulmonary hypertension right-sided heart failure. Referral to cardiology for evaluation with echo and followup. BNP was less than 500 today but she is short of breath, has edema on exam and has pulmonary vascular congestion on chest x-ray done at at [facility]. Furosemide 20 mg daily started. Because of the left lower lobe atelectasis and right upper lobe atelectasis seen on CXR I have ordered a chest CT. She is at risk for PE due to morbid obesity.  Hypersomnia with sleep apnea  she has not been compliant with his her CPAP for many years and apparently was not aware that this could cause heart failure. We'll schedule her for a repeat sleep study.   Updated Medication List Outpatient Encounter Prescriptions as of 07/11/2012  Medication Sig Dispense Refill  . ALPRAZolam (XANAX) 0.25 MG tablet Take 0.25 mg by mouth at bedtime as needed.      Marland Kitchen amLODipine (NORVASC) 10 MG tablet take 1 tablet by mouth once daily  90 tablet  3  . amoxicillin (AMOXIL) 250 MG capsule Take 250 mg by mouth 3 (three) times daily. Before dental procedures      . buPROPion  (WELLBUTRIN XL) 300 MG 24 hr tablet take 1 tablet by mouth once daily  90 tablet  3  . eletriptan (RELPAX) 40 MG tablet One tablet by mouth at onset of headache. May repeat in 2 hours if headache persists or recurs. may repeat in 2 hours if necessary  10 tablet  0  . hydrochlorothiazide (HYDRODIURIL) 25 MG tablet Take 1 tablet (25 mg total) by mouth daily.  90 tablet  2  . levothyroxine (SYNTHROID, LEVOTHROID) 137 MCG tablet Take 1 tablet (137 mcg total) by mouth daily.  30 tablet  5  . omeprazole (PRILOSEC) 20 MG capsule Take 1 capsule (20  mg total) by mouth daily.  30 capsule  3  . promethazine (PHENERGAN) 12.5 MG tablet Take 1 tablet (12.5 mg total) by mouth every 8 (eight) hours as needed for nausea.  20 tablet  0  . topiramate (TOPAMAX) 50 MG tablet TAKE 1 TABLET BY MOUTH 2 TIMES A DAY  60 tablet  3  . traMADol (ULTRAM) 50 MG tablet Take 50 mg by mouth every 6 (six) hours as needed.      . warfarin (COUMADIN) 10 MG tablet TAKE 1 TABLET BY MOUTH AS DIRECTED.  60 tablet  6  . warfarin (COUMADIN) 7.5 MG tablet TAKE 1 TABLET BY MOUTH AS DIRECTED.  60 tablet  11  . zolmitriptan (ZOMIG) 5 MG nasal solution Place 1 spray into the nose as needed for migraine.  6 Units  0  . desvenlafaxine (PRISTIQ) 100 MG 24 hr tablet Take 1 tablet (100 mg total) by mouth daily.  30 tablet  11  . furosemide (LASIX) 20 MG tablet As needed for edema  30 tablet  3  . VESICARE 5 MG tablet take 1 tablet by mouth once daily  30 each  3  . zolpidem (AMBIEN) 5 MG tablet Take 5 mg by mouth at bedtime as needed.         Orders Placed This Encounter  Procedures  . DG Chest 2 View  . CT Chest W Contrast  . Comprehensive metabolic panel  . B Nat Peptide  . D-Dimer, Quantitative  . Lipid panel  . Ambulatory referral to Cardiology  . EKG 12-Lead    No Follow-up on file.

## 2012-07-14 ENCOUNTER — Encounter: Payer: Self-pay | Admitting: Internal Medicine

## 2012-07-14 ENCOUNTER — Telehealth: Payer: Self-pay | Admitting: Internal Medicine

## 2012-07-14 DIAGNOSIS — Z79899 Other long term (current) drug therapy: Secondary | ICD-10-CM

## 2012-07-14 DIAGNOSIS — Z7901 Long term (current) use of anticoagulants: Secondary | ICD-10-CM

## 2012-07-14 NOTE — Assessment & Plan Note (Signed)
She has a history of aortic aneurysm repair and tissue aortic valve replacement done at [facility] several years ago but has not had cardiology followup and has not been taking her Coumadin for the last week. I've asked her to resume it at 10 mg daily and to followup with a PT/INR in one week. Referral to local cardiology for followup.

## 2012-07-14 NOTE — Assessment & Plan Note (Addendum)
Ideology unclear as I did not receive records from [place] , [place] unknown. She has untreated sleep apnea so most likely has pulmonary hypertension right-sided heart failure. Referral to cardiology for evaluation with echo and followup. BNP was less than 500 today but she is short of breath, has edema on exam and has pulmonary vascular congestion on chest x-ray done at at [facility]. Furosemide 20 mg daily started. Because of the left lower lobe atelectasis and right upper lobe atelectasis seen on CXR I have ordered a chest CT. She is at risk for PE due to morbid obesity.

## 2012-07-14 NOTE — Telephone Encounter (Signed)
Pt left message today wanting amber to call her

## 2012-07-14 NOTE — Assessment & Plan Note (Signed)
she has not been compliant with his her CPAP for many years and apparently was not aware that this could cause heart failure. We'll schedule her for a repeat sleep study.

## 2012-07-15 ENCOUNTER — Telehealth: Payer: Self-pay | Admitting: Internal Medicine

## 2012-07-15 ENCOUNTER — Ambulatory Visit: Payer: Self-pay | Admitting: Internal Medicine

## 2012-07-15 NOTE — Telephone Encounter (Signed)
Patient wanting results from her CT Scan done on today 12.24.13.

## 2012-07-15 NOTE — Telephone Encounter (Signed)
See message that I sent earlier. CT showed congestive heart failure. No infection or blood clot. Recommend to continue with Lasix as Dr. Darrick Huntsman prescribed.

## 2012-07-15 NOTE — Telephone Encounter (Signed)
Pt.notified

## 2012-07-15 NOTE — Telephone Encounter (Signed)
Please make sure pt received this message:  This is Dr. Dan Humphreys covering for Dr. Darrick Huntsman. Your chest xray showed changes consistent with congestive heart failure, however no evidence of infection or blood clot.  I would recommend to continue the Furosemide as Dr. Darrick Huntsman prescribed.

## 2012-07-25 ENCOUNTER — Ambulatory Visit (INDEPENDENT_AMBULATORY_CARE_PROVIDER_SITE_OTHER): Payer: BC Managed Care – PPO | Admitting: Internal Medicine

## 2012-07-25 ENCOUNTER — Other Ambulatory Visit (HOSPITAL_COMMUNITY)
Admission: RE | Admit: 2012-07-25 | Discharge: 2012-07-25 | Disposition: A | Discharge status: Home / Self Care | Payer: BC Managed Care – PPO | Source: Ambulatory Visit | Attending: Internal Medicine | Admitting: Internal Medicine

## 2012-07-25 ENCOUNTER — Other Ambulatory Visit (INDEPENDENT_AMBULATORY_CARE_PROVIDER_SITE_OTHER): Payer: BC Managed Care – PPO

## 2012-07-25 ENCOUNTER — Encounter: Payer: Self-pay | Admitting: Internal Medicine

## 2012-07-25 VITALS — BP 124/82 | HR 78 | Temp 98.2°F | Resp 16 | Ht 68.0 in | Wt 314.0 lb

## 2012-07-25 DIAGNOSIS — Z0000 Encounter for general adult medical examination without abnormal findings: Secondary | ICD-10-CM

## 2012-07-25 DIAGNOSIS — Z79899 Other long term (current) drug therapy: Secondary | ICD-10-CM

## 2012-07-25 DIAGNOSIS — Z124 Encounter for screening for malignant neoplasm of cervix: Secondary | ICD-10-CM

## 2012-07-25 DIAGNOSIS — Z23 Encounter for immunization: Secondary | ICD-10-CM

## 2012-07-25 DIAGNOSIS — Z7901 Long term (current) use of anticoagulants: Secondary | ICD-10-CM

## 2012-07-25 DIAGNOSIS — Z01419 Encounter for gynecological examination (general) (routine) without abnormal findings: Secondary | ICD-10-CM | POA: Insufficient documentation

## 2012-07-25 DIAGNOSIS — Z1239 Encounter for other screening for malignant neoplasm of breast: Secondary | ICD-10-CM

## 2012-07-25 DIAGNOSIS — Z1151 Encounter for screening for human papillomavirus (HPV): Secondary | ICD-10-CM | POA: Insufficient documentation

## 2012-07-25 DIAGNOSIS — I509 Heart failure, unspecified: Secondary | ICD-10-CM

## 2012-07-25 DIAGNOSIS — I359 Nonrheumatic aortic valve disorder, unspecified: Secondary | ICD-10-CM

## 2012-07-25 LAB — CBC WITH DIFFERENTIAL/PLATELET
Basophils Absolute: 0.1 10*3/uL (ref 0.0–0.1)
Eosinophils Absolute: 0.2 10*3/uL (ref 0.0–0.7)
HCT: 42 % (ref 36.0–46.0)
Hemoglobin: 14 g/dL (ref 12.0–15.0)
Lymphs Abs: 1 10*3/uL (ref 0.7–4.0)
MCHC: 33.3 g/dL (ref 30.0–36.0)
MCV: 86.2 fl (ref 78.0–100.0)
Monocytes Relative: 7.1 % (ref 3.0–12.0)
Neutro Abs: 5.1 10*3/uL (ref 1.4–7.7)
RDW: 14.6 % (ref 11.5–14.6)

## 2012-07-25 LAB — BASIC METABOLIC PANEL
BUN: 18 mg/dL (ref 6–23)
GFR: 44.48 mL/min — ABNORMAL LOW (ref >60.00)
Glucose, Bld: 92 mg/dL (ref 70–99)
Potassium: 3.4 mEq/L — ABNORMAL LOW (ref 3.5–5.1)

## 2012-07-25 MED ORDER — ZOSTER VACCINE LIVE 19400 UNT/0.65ML ~~LOC~~ SOLR: 0.6500 mL | Freq: Once | SUBCUTANEOUS | Status: DC

## 2012-07-25 MED ORDER — NYSTATIN 100000 UNIT/GM EX POWD: Freq: Four times a day (QID) | CUTANEOUS | Status: DC

## 2012-07-25 NOTE — Progress Notes (Signed)
Patient ID: Maria Torres, female   DOB: Oct 31, 1945, 67 y.o.   MRN: [id number]    Subjective:     Maria Torres is a 67 y.o. female and is here for a comprehensive physical exam.  The patient reports no problems. She has lost 16 lbs since  last visit 2 weeks ago. she has been taking her furosemide and Coumadin daily and drinking increased amounts of water to curb her appetite. She has also modified her diet and has increased her intake of fresh fruits and yogurt. Her family has had a bit of a rallying effect around her since she was last seen and evaluated for pulmonary embolism and this was the Using diuretic  Daily ,,  Drinking lemon water   Yogurt with fresh fruit and granola.  Past Medical History  Diagnosis Date  . Morbid obesity   . Hx of glaucoma   . Thyroid disease     hypothyrodism  . GERD (gastroesophageal reflux disease)   . Hypertension   . Aortic stenosis   . 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 Obstructive sleep apnea on CPAP   . Anemia   . Headache   . Cervicalgia   . Mitral valve disorder   . Hyperlipidemia   . CHF (congestive heart failure)    Past Surgical History  Procedure Date  . Cholecystectomy   . Bilateral arthroscopic knee surgery 2001 & 2002  . Gastric banding surgery     @ [place] 04-16-96  . Unilateral total knee replacement 08/2002  . Total left hip replacement 01-20-2007  . Ascending aortic aneurysm repair w/ tissue aortic valve replacement    History   Social History  . Marital Status: Single    Spouse Name: N/A    Number of Children: N/A  . Years of Education: N/A   Occupational History  . Not on file.   Social History Main Topics  . Smoking status: Never Smoker   . Smokeless tobacco: Never Used  . Alcohol Use: Yes  . Drug Use: No  . Sexually Active: Not on file   Other Topics Concern  . Not on file   Social History Narrative  . No narrative on file   Health Maintenance  Topic Date Due  . Tetanus/tdap   07/12/1966  . Mammogram  07/12/1997  . Zostavax  07/13/2007  . Influenza Vaccine  03/23/2012  . Pneumococcal Polysaccharide Vaccine Age 35 And Over  07/12/2012  . Colonoscopy  04/24/2021    The following portions of the patient's history were reviewed and updated as appropriate: allergies, current medications, past family history, past medical history, past social history, past surgical history and problem list.  Review of Systems A comprehensive review of systems was negative.   Objective:     General appearance: morbidly obese, well groomed,  alert, cooperative and appears younger than stated age Head: Normocephalic, without obvious abnormality, atraumatic Eyes: conjunctivae/corneas clear. PERRL, EOM's intact. Fundi benign. Ears: normal TM's and external ear canals both ears Nose: Nares normal. Septum midline. Mucosa normal. No drainage or sinus tenderness. Throat: lips, mucosa, and tongue normal; teeth and gums normal Neck: no adenopathy, no carotid bruit, no JVD, supple, symmetrical, trachea midline and thyroid not enlarged, symmetric, no tenderness/mass/nodules Lungs: clear to auscultation bilaterally Chest wall: well healed medistinal scar  Breasts: pendulous, no masses or tenderness, No nipple retraction or dimpling, No axillary or supraclavicular adenopathy, Normal to palpation without dominant masses Heart: regular rate and rhythm, S1, S2  normal, systolic click of aortic valve noted, no murmur,  rub or gallop Abdomen: soft, non-tender; bowel sounds normal; no masses,  no organomegaly. Pelvic: adnexae not palpable, cervix normal in appearance, external genitalia normal, no adnexal masses or tenderness, no bladder tenderness, no cervical motion tenderness, rectovaginal septum normal, urethra without abnormality or discharge, uterus normal size, shape, and consistency and vagina normal without discharge Extremities:  Trace pitting edema extremities normal, atraumatic, no cyanosis    Pulses: 2+ and symmetric Skin: Skin color, texture, turgor normal. No rashes or lesions Neurologic: Alert and oriented X 3, normal strength and tone. Normal symmetric reflexes. Normal coordination and gait.    Assessment:    Aortic valve disorder She is status post aortic valve replacement and had a lapse in her Coumadin for several days prior to her last visit on December 20. Current INR is 2.1 on 10 mg daily. I will have her increase her total weekly dose to to 85 mg weekly  By taking 15 mg on Sundays Tuesdays and Thursday,, 10  Mg all other days and repeat PT/INR in one week Once  she has used up her current bottles,we will change her dose to a more uniform regimen depending on her next  PT/INR   CHF (congestive heart failure) And she cardiology in the next several weeks for echocardiogram and evaluation of both aortic and mitral valves. She's been taking furosemide daily since her last visit with a weight loss of 16 pounds and her edema has resolved. And stopping the daily furosemide now because her repeat potassium and creatinine have indicated mild dehydration. And hypokalemia. She will resume critical her Dyazide daily for management of hypertension.  Routine general medical examination at a health care facility Annual GYN and breast exam were done today. Identification of cervix was difficult due to patient's body habitus.   Updated Medication List Outpatient Encounter Prescriptions as of 07/25/2012  Medication Sig Dispense Refill  . ALPRAZolam (XANAX) 0.25 MG tablet Take 0.25 mg by mouth at bedtime as needed.      Marland Kitchen amLODipine (NORVASC) 10 MG tablet take 1 tablet by mouth once daily  90 tablet  3  . buPROPion (WELLBUTRIN XL) 300 MG 24 hr tablet take 1 tablet by mouth once daily  90 tablet  3  . eletriptan (RELPAX) 40 MG tablet One tablet by mouth at onset of headache. May repeat in 2 hours if headache persists or recurs. may repeat in 2 hours if necessary  10 tablet  0  . furosemide  (LASIX) 20 MG tablet As needed for edema  30 tablet  3  . hydrochlorothiazide (HYDRODIURIL) 25 MG tablet Take 1 tablet (25 mg total) by mouth daily.  90 tablet  2  . levothyroxine (SYNTHROID, LEVOTHROID) 137 MCG tablet Take 1 tablet (137 mcg total) by mouth daily.  30 tablet  5  . omeprazole (PRILOSEC) 20 MG capsule Take 1 capsule (20 mg total) by mouth daily.  30 capsule  3  . topiramate (TOPAMAX) 50 MG tablet TAKE 1 TABLET BY MOUTH 2 TIMES A DAY  60 tablet  3  . traMADol (ULTRAM) 50 MG tablet Take 50 mg by mouth every 6 (six) hours as needed.      . warfarin (COUMADIN) 10 MG tablet TAKE 1 TABLET BY MOUTH AS DIRECTED.  60 tablet  6  . warfarin (COUMADIN) 7.5 MG tablet TAKE 1 TABLET BY MOUTH AS DIRECTED.  60 tablet  11  . zolmitriptan (ZOMIG) 5 MG nasal solution Place  1 spray into the nose as needed for migraine.  6 Units  0  . amoxicillin (AMOXIL) 250 MG capsule Take 250 mg by mouth 3 (three) times daily. Before dental procedures      . desvenlafaxine (PRISTIQ) 100 MG 24 hr tablet Take 1 tablet (100 mg total) by mouth daily.  30 tablet  11  . nystatin (MYCOSTATIN) powder Apply topically 4 (four) times daily.  15 g  1  . potassium chloride (K-DUR,KLOR-CON) 10 MEQ tablet Take 2 tablets (20 mEq total) by mouth daily.  10 tablet  1  . promethazine (PHENERGAN) 12.5 MG tablet Take 1 tablet (12.5 mg total) by mouth every 8 (eight) hours as needed for nausea.  20 tablet  0  . VESICARE 5 MG tablet take 1 tablet by mouth once daily  30 each  3  . zolpidem (AMBIEN) 5 MG tablet Take 5 mg by mouth at bedtime as needed.      . zoster vaccine live, PF, (ZOSTAVAX) 21308 UNT/0.65ML injection Inject 19,400 Units into the skin once.  1 vial  0

## 2012-07-26 ENCOUNTER — Telehealth: Payer: Self-pay | Admitting: Internal Medicine

## 2012-07-26 DIAGNOSIS — Z0000 Encounter for general adult medical examination without abnormal findings: Secondary | ICD-10-CM | POA: Insufficient documentation

## 2012-07-26 MED ORDER — POTASSIUM CHLORIDE CRYS ER 10 MEQ PO TBCR: 20.0000 meq | EXTENDED_RELEASE_TABLET | Freq: Every day | ORAL | Status: DC

## 2012-07-26 NOTE — Assessment & Plan Note (Signed)
Annual GYN and breast exam were done today. Identification of cervix was difficult due to patient's body habitus.

## 2012-07-26 NOTE — Assessment & Plan Note (Signed)
She is status post aortic valve replacement and had a lapse in her Coumadin for several days prior to her last visit on December 20. Current INR is 2.1 on 10 mg daily. I will have her increase her total weekly dose to to 85 mg weekly  By taking 15 mg on Sundays Tuesdays and Thursday,, 10  Mg all other days and repeat PT/INR in one week Once  she has used up her current bottles,we will change her dose to a more uniform regimen depending on her next  PT/INR

## 2012-07-26 NOTE — Assessment & Plan Note (Signed)
And she cardiology in the next several weeks for echocardiogram and evaluation of both aortic and mitral valves. She's been taking furosemide daily since her last visit with a weight loss of 16 pounds and her edema has resolved. And stopping the daily furosemide now because her repeat potassium and creatinine have indicated mild dehydration. And hypokalemia. She will resume critical her Dyazide daily for management of hypertension.

## 2012-07-31 ENCOUNTER — Encounter: Payer: Self-pay | Admitting: Internal Medicine

## 2012-08-04 ENCOUNTER — Ambulatory Visit (INDEPENDENT_AMBULATORY_CARE_PROVIDER_SITE_OTHER): Payer: BC Managed Care – PPO | Admitting: Cardiovascular Disease

## 2012-08-04 ENCOUNTER — Encounter: Payer: Self-pay | Admitting: Cardiovascular Disease

## 2012-08-04 VITALS — BP 158/78 | HR 64 | Ht 68.0 in | Wt 321.5 lb

## 2012-08-04 DIAGNOSIS — I359 Nonrheumatic aortic valve disorder, unspecified: Secondary | ICD-10-CM

## 2012-08-04 DIAGNOSIS — I509 Heart failure, unspecified: Secondary | ICD-10-CM

## 2012-08-04 DIAGNOSIS — I10 Essential (primary) hypertension: Secondary | ICD-10-CM

## 2012-08-04 NOTE — Patient Instructions (Addendum)
Your physician has requested that you have an echocardiogram. Echocardiography is a painless test that uses sound waves to create images of your heart. It provides your doctor with information about the size and shape of your heart and how well your heart's chambers and valves are working. This procedure takes approximately one hour. There are no restrictions for this procedure.   Your physician wants you to follow-up in: 6 months with Dr. Arida. You will receive a reminder letter in the mail two months in advance. If you don't receive a letter, please call our office to schedule the follow-up appointment.  

## 2012-08-04 NOTE — Assessment & Plan Note (Signed)
Her blood pressure is mildly elevated today

## 2012-08-04 NOTE — Assessment & Plan Note (Signed)
I agree with with lifelong anticoagulation with a target INR between 2 and 3. An echocardiogram is requested for further evaluation of this given her recent symptoms of heart failure. The patient had no coronary artery disease on cardiac catheterization before her aortic valve replacement.

## 2012-08-04 NOTE — Progress Notes (Signed)
HPI  This is a pleasant 67 year old female who was referred by Dr. Darrick Huntsman for evaluation of dyspnea and lower extremity edema. The patient has known history of severe aortic stenosis due to bicuspid aortic valve as well as a descending aortic aneurysm. 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 Zebedee Iba. No cardiac events since then. She has been on warfarin. In mid December, she started complaining of increased dyspnea, lower extremity edema and weight gain. She was placed on furosemide 20 mg once daily with significant improvement in her symptoms. However, her renal function slightly worsened with hypokalemia. Thus, furosemide was stopped. She was already on hydrochlorothiazide 25 mg once daily. After she stopped furosemide, she started complaining again of increased dyspnea and lower extremity edema. She denies any chest pain, orthopnea or PND. The patient has known history of morbid obesity. She underwent unsuccessful lab band surgery many years ago. She also has history of sleep apnea on CPAP.  No Known Allergies   Current Outpatient Prescriptions on File Prior to Visit  Medication Sig Dispense Refill  . ALPRAZolam (XANAX) 0.25 MG tablet Take 0.25 mg by mouth at bedtime as needed.      Marland Kitchen amLODipine (NORVASC) 10 MG tablet take 1 tablet by mouth once daily  90 tablet  3  . amoxicillin (AMOXIL) 250 MG capsule Take 250 mg by mouth 3 (three) times daily. Before dental procedures      . buPROPion (WELLBUTRIN XL) 300 MG 24 hr tablet take 1 tablet by mouth once daily  90 tablet  3  . desvenlafaxine (PRISTIQ) 100 MG 24 hr tablet Take 1 tablet (100 mg total) by mouth daily.  30 tablet  11  . eletriptan (RELPAX) 40 MG tablet One tablet by mouth at onset of headache. May repeat in 2 hours if headache persists or recurs. may  repeat in 2 hours if necessary  10 tablet  0  . furosemide (LASIX) 20 MG tablet As needed for edema  30 tablet  3  . hydrochlorothiazide (HYDRODIURIL) 25 MG tablet Take 1 tablet (25 mg total) by mouth daily.  90 tablet  2  . levothyroxine (SYNTHROID, LEVOTHROID) 137 MCG tablet Take 1 tablet (137 mcg total) by mouth daily.  30 tablet  5  . nystatin (MYCOSTATIN) powder Apply topically 4 (four) times daily.  15 g  1  . omeprazole (PRILOSEC) 20 MG capsule Take 1 capsule (20 mg total) by mouth daily.  30 capsule  3  . potassium chloride (K-DUR,KLOR-CON) 10 MEQ tablet Take 2 tablets (20 mEq total) by mouth daily.  10 tablet  1  . promethazine (PHENERGAN) 12.5 MG tablet Take 1 tablet (12.5 mg total) by mouth every 8 (eight) hours as needed for nausea.  20 tablet  0  . topiramate (TOPAMAX) 50 MG tablet TAKE 1 TABLET BY MOUTH 2 TIMES A DAY  60 tablet  3  . traMADol (ULTRAM) 50 MG tablet Take 50 mg by mouth every 6 (six) hours as needed.      . VESICARE 5 MG tablet take 1 tablet by mouth once daily  30 each  3  . warfarin (COUMADIN) 10 MG tablet TAKE 1 TABLET BY MOUTH AS DIRECTED.  60 tablet  6  . warfarin (COUMADIN) 7.5 MG tablet TAKE 1 TABLET BY MOUTH  AS DIRECTED.  60 tablet  11  . zolmitriptan (ZOMIG) 5 MG nasal solution Place 1 spray into the nose as needed for migraine.  6 Units  0  . zolpidem (AMBIEN) 5 MG tablet Take 5 mg by mouth at bedtime as needed.      . zoster vaccine live, PF, (ZOSTAVAX) 16109 UNT/0.65ML injection Inject 19,400 Units into the skin once.  1 vial  0  . [DISCONTINUED] solifenacin (VESICARE) 10 MG tablet Take 0.5 tablets (5 mg total) by mouth daily.  30 tablet  3     Past Medical History  Diagnosis Date  . Morbid obesity   . Hx of glaucoma   . Thyroid disease     hypothyrodism  . GERD (gastroesophageal reflux disease)   . Hypertension   . Aortic stenosis   . 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 Obstructive sleep apnea on CPAP   . Anemia   . Headache   .  Cervicalgia   . Mitral valve disorder   . Hyperlipidemia   . CHF (congestive heart failure)      Past Surgical History  Procedure Date  . Cholecystectomy   . Bilateral arthroscopic knee surgery 2001 & 2002  . Gastric banding surgery     @ [place] 04-16-96  . Unilateral total knee replacement 08/2002  . Total left hip replacement 01-20-2007  . Ascending aortic aneurysm repair w/ tissue aortic valve replacement   . Cardiac catheterization 2004    [facility]  . Cardiac catheterization 2008    [facility]     Family History  Problem Relation Age of Onset  . Coronary artery disease Mother   . Heart disease Mother 36  . COPD Mother   . Hyperlipidemia Mother   . Cancer Father     testicular  . Pulmonary embolism Father   . Obesity Sister     and knee problems  . Cancer Sister     breast  . Cancer Brother     throat     History   Social History  . Marital Status: Single    Spouse Name: N/A    Number of Children: N/A  . Years of Education: N/A   Occupational History  . Not on file.   Social History Main Topics  . Smoking status: Never Smoker   . Smokeless tobacco: Never Used  . Alcohol Use: Yes  . Drug Use: No  . Sexually Active: Not on file   Other Topics Concern  . Not on file   Social History Narrative  . No narrative on file     ROS Constitutional: Negative for fever, chills, diaphoresis, activity change, appetite change and fatigue.  HENT: Negative for hearing loss, nosebleeds, congestion, sore throat, facial swelling, drooling, trouble swallowing, neck pain, voice change, sinus pressure and tinnitus.  Eyes: Negative for photophobia, pain, discharge and visual disturbance.  Respiratory: Negative for apnea, cough, chest tightness  and wheezing.  Cardiovascular: Negative for chest pain, palpitations .  Gastrointestinal: Negative for nausea, vomiting, abdominal pain, diarrhea, constipation, blood in stool and abdominal distention.  Genitourinary: Negative for  dysuria, urgency, frequency, hematuria and decreased urine volume.  Musculoskeletal: Negative for myalgias, back pain, joint swelling, arthralgias and gait problem.  Skin: Negative for color change, pallor, rash and wound.  Neurological: Negative for dizziness, tremors, seizures, syncope, speech difficulty, weakness, light-headedness, numbness and headaches.  Psychiatric/Behavioral: Negative for suicidal ideas, hallucinations, behavioral problems and agitation. The patient is not nervous/anxious.  PHYSICAL EXAM   Ht 5\' 8"  (1.727 m)  Wt 321 lb 8 oz (145.831 kg)  BMI 48.88 kg/m2 Constitutional: She is oriented to person, place, and time. She appears well-developed and well-nourished. No distress.  HENT: No nasal discharge.  Head: Normocephalic and atraumatic.  Eyes: Pupils are equal and round. Right eye exhibits no discharge. Left eye exhibits no discharge.  Neck: Normal range of motion. Neck supple. No JVD present. No thyromegaly present.  Cardiovascular: Normal rate, regular rhythm, normal mechanical heart sounds. Exam reveals no gallop and no friction rub. There is a 2/6 systolic ejection murmur at the aortic area. Pulmonary/Chest: Effort normal and breath sounds normal. No stridor. No respiratory distress. She has no wheezes. She has no rales. She exhibits no tenderness.  Abdominal: Soft. Bowel sounds are normal. She exhibits no distension. There is no tenderness. There is no rebound and no guarding.  Musculoskeletal: Normal range of motion. She exhibits no edema and no tenderness.  Neurological: She is alert and oriented to person, place, and time. Coordination normal.  Skin: Skin is warm and dry. No rash noted. She is not diaphoretic. No erythema. No pallor.  Psychiatric: She has a normal mood and affect. Her behavior is normal. Judgment and thought content normal.     EKG: Sinus  Rhythm  WITHIN NORMAL LIMITS   ASSESSMENT AND PLAN

## 2012-08-04 NOTE — Assessment & Plan Note (Signed)
I suspect that the patient has diastolic heart failure related to hypertension and possibly worsened by sleep apnea. I recommend continuing furosemide 20 mg once daily but only 3 times a week to prevent significant hyperkalemia and volume depletion given that she is already on hydrochlorothiazide. I will obtain an echocardiogram to evaluate her mechanical aortic valve.

## 2012-08-08 LAB — HM PAP SMEAR: HM Pap smear: NORMAL

## 2012-08-13 ENCOUNTER — Encounter: Payer: Self-pay | Admitting: Internal Medicine

## 2012-08-14 ENCOUNTER — Telehealth: Payer: Self-pay | Admitting: Internal Medicine

## 2012-08-14 MED ORDER — CITALOPRAM HYDROBROMIDE 40 MG PO TABS: 40.0000 mg | ORAL_TABLET | Freq: Every day | ORAL | Status: DC

## 2012-08-14 MED ORDER — ELETRIPTAN HYDROBROMIDE 40 MG PO TABS: 40.0000 mg | ORAL_TABLET | ORAL | Status: DC | PRN

## 2012-08-14 NOTE — Telephone Encounter (Signed)
Maria Torres, First, this is Maria Torres's pt. Just FYI. Second, can you start process of prior authorization, or have Eber Jones start this for her. Thanks!

## 2012-08-14 NOTE — Telephone Encounter (Signed)
I sent Rx to [facility]

## 2012-08-14 NOTE — Telephone Encounter (Signed)
Maria Torres states that Dr. Dan Humphreys gave her a sample of Relpax for migraines.  Verified in EPIC.  States it worked well.  Requesting script.  Declines triage.  Office, please f/u with pt regarding script request.

## 2012-08-14 NOTE — Telephone Encounter (Signed)
Pt is calling back and is saying the rx for her headaches were sent in but the pharmacy is needing a authorization for it and she is completely out of those meds and is getting a migrane again. She is asking if we could have that sent in so her migraines won't come back and start up again.

## 2012-08-15 ENCOUNTER — Telehealth: Payer: Self-pay | Admitting: Internal Medicine

## 2012-08-15 NOTE — Telephone Encounter (Signed)
Caller: Maria Torres/Patient; Phone: [phone]; Reason for Call: Patient calling back.  Asking if there are samples that her sister can come by and pick up of the Relpax?  I had offered to check on this earlier and she declined but now states that her sister can come by to pick up samples if there are any.  I explained that the office doors would be locked in 5 minutes.  If none available, she is asking that a different medication for her migraines be called in to her pharmacy on file in [facility].

## 2012-08-15 NOTE — Telephone Encounter (Signed)
Caller: [name]/Patient; Phone: [phone]; Reason for Call: Patient calling, still hasn't gotten her migraine h/a medication.  Pre-authorization was required.  Per EPIC, the script was sent to the pharmacy and pre-authorization was started on 1/23.  I offered to ask if there were samples available for her until the insurance company approves and she said that she lives in [place] so this is not an option for her.

## 2012-08-15 NOTE — Telephone Encounter (Signed)
Sent to Call A Nurse

## 2012-08-15 NOTE — Telephone Encounter (Signed)
Unable to call pt back phone not accepting incoming calls. Awaiting Prior Auth for medication.

## 2012-08-19 NOTE — Telephone Encounter (Signed)
[facility] and spoke with [place], stated he will fax form to [place] now. ... CLO

## 2012-08-20 ENCOUNTER — Telehealth: Payer: Self-pay | Admitting: *Deleted

## 2012-08-20 NOTE — Telephone Encounter (Signed)
Called [phone] for prior authorization for Relpax 40 mg got approved form is being faxed over now

## 2012-08-26 NOTE — Telephone Encounter (Signed)
Can you see if this PA has been started?

## 2012-08-30 ENCOUNTER — Other Ambulatory Visit: Payer: Self-pay | Admitting: General Practice

## 2012-08-30 MED ORDER — ELETRIPTAN HYDROBROMIDE 40 MG PO TABS: 40.0000 mg | ORAL_TABLET | ORAL | Status: DC | PRN

## 2012-08-30 NOTE — Telephone Encounter (Signed)
PA APPROVED. Waiting on Fax from [place].

## 2012-08-31 ENCOUNTER — Other Ambulatory Visit: Payer: Self-pay | Admitting: Internal Medicine

## 2012-09-02 ENCOUNTER — Telehealth: Payer: Self-pay | Admitting: Internal Medicine

## 2012-09-02 ENCOUNTER — Other Ambulatory Visit: Payer: Self-pay

## 2012-09-02 ENCOUNTER — Other Ambulatory Visit (INDEPENDENT_AMBULATORY_CARE_PROVIDER_SITE_OTHER): Payer: BC Managed Care – PPO

## 2012-09-02 DIAGNOSIS — I359 Nonrheumatic aortic valve disorder, unspecified: Secondary | ICD-10-CM

## 2012-09-02 DIAGNOSIS — I509 Heart failure, unspecified: Secondary | ICD-10-CM

## 2012-09-02 NOTE — Telephone Encounter (Signed)
Pt came by and wanted to know if she could start getting her PT/INR checked at [facility] and if so could she have a standing order for 6 month's continuously to have that done at the one in [place]

## 2012-09-03 NOTE — Telephone Encounter (Signed)
Eber Jones , do you know to set up Ms Chermak's PT/INR  up as a monthly [facility] draw?  She watns to use the [place] location due now,.

## 2012-09-06 ENCOUNTER — Other Ambulatory Visit: Payer: Self-pay

## 2012-09-08 LAB — HM MAMMOGRAPHY: HM Mammogram: NORMAL

## 2012-09-15 ENCOUNTER — Encounter: Payer: Self-pay | Admitting: Internal Medicine

## 2012-09-15 ENCOUNTER — Other Ambulatory Visit: Payer: Self-pay

## 2012-09-15 ENCOUNTER — Telehealth: Payer: Self-pay | Admitting: Internal Medicine

## 2012-09-15 ENCOUNTER — Ambulatory Visit (INDEPENDENT_AMBULATORY_CARE_PROVIDER_SITE_OTHER): Payer: BC Managed Care – PPO | Admitting: Internal Medicine

## 2012-09-15 VITALS — BP 141/84 | HR 71 | Temp 98.1°F | Resp 16 | Wt 311.0 lb

## 2012-09-15 DIAGNOSIS — J45901 Unspecified asthma with (acute) exacerbation: Secondary | ICD-10-CM

## 2012-09-15 MED ORDER — PREDNISONE 10 MG PO TABS: ORAL_TABLET | ORAL | Status: DC

## 2012-09-15 MED ORDER — BUDESONIDE-FORMOTEROL FUMARATE 160-4.5 MCG/ACT IN AERO: 2.0000 | INHALATION_SPRAY | Freq: Two times a day (BID) | RESPIRATORY_TRACT | Status: DC

## 2012-09-15 MED ORDER — METHYLPREDNISOLONE ACETATE 40 MG/ML IJ SUSP
40.0000 mg | Freq: Once | INTRAMUSCULAR | Status: AC
  Administered 2012-09-15: 40 mg via INTRAMUSCULAR

## 2012-09-15 MED ORDER — HYDROCODONE-HOMATROPINE 5-1.5 MG/5ML PO SYRP: 5.0000 mL | ORAL_SOLUTION | Freq: Four times a day (QID) | ORAL | Status: DC | PRN

## 2012-09-15 NOTE — Telephone Encounter (Signed)
Have patient come now and we will work her in this afternoon. Get vital signs and a pulse ox on her resting and ambulatory and put her in a room. Start a nebulizer.

## 2012-09-15 NOTE — Patient Instructions (Addendum)
You are having an asthma/bronchitis episode  You have been prescribed inhaled steroids and systemic steroids  To take for the next 10 days to 2 weeks   Will call you with INR results  Cough medicine to quiet cough,  Use every 6 hours
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
## 2012-09-15 NOTE — Telephone Encounter (Signed)
Patient notified as instructed by telephone. Was advised that she should be able to get to the office in about 45 minutes.

## 2012-09-15 NOTE — Assessment & Plan Note (Signed)
Empiric treatment with 10 days of steroids,  Cough suppressants.. return in one week  Needs PFTs when better

## 2012-09-15 NOTE — Telephone Encounter (Signed)
Patient Information:  Caller Name: Maurene  Phone: [phone]  Patient: Torres, Maria  Gender: Female  DOB: 1946-04-06  Age: 67 Years  PCP: Duncan Dull (Adults only)  Office Follow Up:  Does the office need to follow up with this patient?: Yes  Instructions For The Office: Patient needs to be seen within 4 hours per nursing judgment due to wheezing and mild shortness of breath.   Symptoms  Reason For Call & Symptoms: Reports she was seen in ED recently and diagnosed with CHF; she states it is apparently bronchitis.  Uses inhaler and congestion persists.  Sounds hoarse and wheezing audible during conversation.  W[facility] ambulance [facility] due to breathing difficulty on 09/10/12.  Last use of inhaler 10:15.and 07:30 prior to that.  Coughing continuously interferes with sleep; has been sleeping in chair.  Reviewed Health History In EMR: Yes  Reviewed Medications In EMR: Yes  Reviewed Allergies In EMR: Yes  Reviewed Surgeries / Procedures: Yes  Date of Onset of Symptoms: 09/10/2012  Treatments Tried: Albuterol inhaler.  Furosemide  Treatments Tried Worked: No  Guideline(s) Used:  Breathing Difficulty  Asthma Attack  Disposition Per Guideline:   See Today in Office  Reason For Disposition Reached:   Coughing continuously (nonstop) that keeps from working or sleeping, and not improved after inhaler or nebulizer  Advice Given:  Quick-Relief Asthma Medicine:   Start your quick-relief medicine (e.g., albuterol, salbutamol) at the first sign of any coughing or shortness of breath (don't wait for wheezing). Use your inhaler (2 puffs each time) or nebulizer every 4 hours. Continue the quick-relief medicine until you have not wheezed or coughed for 48 hours.  The best "cough medicine" for an adult with asthma is always the asthma medicine (Note: Don't use cough suppressants, but cough drops may help a tickly cough).  Drinking Liquids:  Try to drink normal amount of  liquids (e.g., water). Being adequately hydrated makes it easier to cough up the sticky lung mucus.  Humidifier:   If the air is dry, use a cool mist humidifier to prevent drying of the upper airway.  Hay Fever  : If you have nasal symptoms from hay fever, it's OK to take antihistamines (Reasons: poor control of allergic rhinitis makes asthma worse whereas antihistamines don't make asthma worse).  Avoid Triggers:  Avoid known triggers of asthma attacks (e.g., tobacco smoke, cats, other pets, feather pillows, exercise).  Call Back If:  Inhaled asthma medicine (nebulizer or inhaler) is needed more often than every 4 hours  Wheezing has not completely cleared after 5 days  You become worse.  RN Overrode Recommendation:  Make Appointment  See in 4 hours per nursing judgment.

## 2012-09-16 LAB — PROTIME-INR: Prothrombin Time: 24 s — ABNORMAL HIGH (ref 10.2–12.4)

## 2012-09-16 LAB — BRAIN NATRIURETIC PEPTIDE: Pro B Natriuretic peptide (BNP): 36 pg/mL (ref 0.0–100.0)

## 2012-09-17 ENCOUNTER — Encounter: Payer: Self-pay | Admitting: Internal Medicine

## 2012-09-18 ENCOUNTER — Other Ambulatory Visit: Payer: Self-pay | Admitting: *Deleted

## 2012-09-18 MED ORDER — CITALOPRAM HYDROBROMIDE 40 MG PO TABS: 40.0000 mg | ORAL_TABLET | Freq: Every day | ORAL | Status: DC

## 2012-09-19 ENCOUNTER — Telehealth: Payer: Self-pay | Admitting: Internal Medicine

## 2012-09-19 NOTE — Telephone Encounter (Signed)
Patient Information:  Caller Name: Konni  Phone: [phone]  Patient: Maria Torres, Maria Torres  Gender: Female  DOB: [birth year]  Age: 67 Years  PCP: Duncan Dull (Adults only)  Office Follow Up:  Does the office need to follow up with this patient?: No  Instructions For The Office: N/A  RN Note:  Since appts were full, RN told pt that I will send a note to office to see if she can be worked in for appt.  Pt declined, said she would just go to a walk in clinic near where she lives.  She does not wish to be called back for an appt.    Symptoms  Reason For Call & Symptoms: Pt calling today regarding she was seen in office on Monday 09/15/12 and diagnosed with acute bronchitis and was prescribed Hycodan, Prednisone and Symbicort.  Pt calling today because having some ear pain.  Hears popping.  Reviewed Health History In EMR: Yes  Reviewed Medications In EMR: Yes  Reviewed Allergies In EMR: Yes  Reviewed Surgeries / Procedures: Yes  Date of Onset of Symptoms: 09/19/2012  Guideline(s) Used:  Earache  Disposition Per Guideline:   See Today in Office  Reason For Disposition Reached:   All other earaches (Exceptions: earache lasting < 1 hour, and earache from air travel)  Advice Given:  N/A  RN Overrode Recommendation:  Document Patient  Pt will go to a walk in clinc near her home, since no appts available in office.  Pt declined a possible work in appt.

## 2012-09-22 ENCOUNTER — Ambulatory Visit: Payer: BC Managed Care – PPO | Admitting: Internal Medicine

## 2012-09-24 ENCOUNTER — Encounter: Payer: Self-pay | Admitting: Internal Medicine

## 2012-09-24 ENCOUNTER — Ambulatory Visit: Payer: BC Managed Care – PPO | Admitting: Internal Medicine

## 2012-11-05 ENCOUNTER — Other Ambulatory Visit: Payer: Self-pay | Admitting: *Deleted

## 2012-11-06 MED ORDER — CITALOPRAM HYDROBROMIDE 40 MG PO TABS: 40.0000 mg | ORAL_TABLET | Freq: Every day | ORAL | Status: DC

## 2012-11-06 MED ORDER — POTASSIUM CHLORIDE CRYS ER 10 MEQ PO TBCR: 20.0000 meq | EXTENDED_RELEASE_TABLET | Freq: Every day | ORAL | Status: DC

## 2012-11-06 NOTE — Telephone Encounter (Signed)
Several issues.  1) the potassium is only taken on days she takes furosemide , (which per Dr. Kirke Corin is not daily) so  She should not need #30 /month.  2) She has not had her PT/INR done since Feb 24th/  Has she been set up to have them done in [place] at the [facility] as requested > If not, ask Eber Jones to get this done.

## 2012-11-06 NOTE — Telephone Encounter (Signed)
Please Advise... On the potassium chloride pt is getting #10 with 1 refill I just want to know did you want to up to #30 with 5 refills or keep the same.

## 2013-01-06 ENCOUNTER — Telehealth: Payer: Self-pay | Admitting: *Deleted

## 2013-01-06 ENCOUNTER — Other Ambulatory Visit: Payer: Self-pay | Admitting: *Deleted

## 2013-01-06 NOTE — Telephone Encounter (Signed)
Called [employer] [place] for prior authorization on the Omeprazole, form was given to [facility]

## 2013-01-07 ENCOUNTER — Telehealth: Payer: Self-pay | Admitting: Internal Medicine

## 2013-01-07 NOTE — Telephone Encounter (Signed)
Patient notified

## 2013-01-07 NOTE — Telephone Encounter (Signed)
Her insurance will no longer pay for her prilosec otc , which is a proton pump inhibitor used to treat reflux..   They are requiring a switch to a covered alternative therapy but have not provided a list of covered alternatives.  .  Due to the volume of these mandated changes we have been inundated with for every patient , we simply cannot devote the time (20 minutes per request per patient) to look these up for every patient unless it is absolutely necessary.  Therefore I would like her to  Look in her [employer] drug formulary to see which one they cover,  Or simply continue the prilosec otc which is available OTC as the  name brand or as generic omeprazole.

## 2013-01-20 ENCOUNTER — Telehealth: Payer: Self-pay | Admitting: Internal Medicine

## 2013-01-20 NOTE — Telephone Encounter (Signed)
During a chart review to support her insurance company's 4 page request to justify the use of omeprazole ,  I noticed that she , again, not keep up with monthly or even quarterly PT/INRs to monitor her coumadin therapy.  I really must insist that she have INRs done when I request thaem or I cannto be reposnsible for refilling her coumadin. If someone else has been handling it since January please let me know.

## 2013-01-21 NOTE — Telephone Encounter (Signed)
Spoke with pt, has not been having PT/INR checked elsewhere. States it had been discussed that she could get it set up for her to check them at home, would like to do that if possible. Or, she could get an order for [facility] and have it drawn closer to home.

## 2013-01-21 NOTE — Telephone Encounter (Signed)
Please call [facility] to set up the coumadin home service asap.

## 2013-01-22 NOTE — Telephone Encounter (Signed)
Called [facility] they are sending over order form for signature

## 2013-02-03 NOTE — Telephone Encounter (Signed)
Form filled out and sent to [facility] as requested.

## 2013-02-08 ENCOUNTER — Other Ambulatory Visit: Payer: Self-pay | Admitting: Internal Medicine

## 2013-02-09 ENCOUNTER — Other Ambulatory Visit: Payer: Self-pay | Admitting: *Deleted

## 2013-02-09 DIAGNOSIS — E039 Hypothyroidism, unspecified: Secondary | ICD-10-CM

## 2013-02-10 MED ORDER — LEVOTHYROXINE SODIUM 137 MCG PO TABS: 137.0000 ug | ORAL_TABLET | Freq: Every day | ORAL | Status: DC

## 2013-02-19 DIAGNOSIS — M1610 Unilateral primary osteoarthritis, unspecified hip: Secondary | ICD-10-CM | POA: Insufficient documentation

## 2013-02-25 ENCOUNTER — Other Ambulatory Visit: Payer: Self-pay

## 2013-02-26 ENCOUNTER — Telehealth: Payer: Self-pay | Admitting: Internal Medicine

## 2013-02-26 NOTE — Telephone Encounter (Signed)
Called patient at work appointment set up today with Waynetta Sandy at [place].

## 2013-02-26 NOTE — Telephone Encounter (Signed)
[facility] needs to set up appt with pt to set up MD/INR.  Pt is not returning their calls.  Limited time before MDINR will have to be sent back.  If the pt no longer wants to have this set up, need to cancel order with [facility].  Please reply asap to Gi Or Norman @ [facility]  [phone].

## 2013-03-05 ENCOUNTER — Other Ambulatory Visit: Payer: Self-pay | Admitting: Internal Medicine

## 2013-03-12 ENCOUNTER — Telehealth: Payer: Self-pay | Admitting: *Deleted

## 2013-03-12 NOTE — Telephone Encounter (Signed)
Needing to have INR'S performed at [facility],order written and to be faxed as requested by patient . Patient notified INR is now at [facility] and that she needs to have INR done asap.

## 2013-03-18 ENCOUNTER — Telehealth: Payer: Self-pay | Admitting: *Deleted

## 2013-03-18 ENCOUNTER — Other Ambulatory Visit: Payer: Self-pay | Admitting: Internal Medicine

## 2013-03-18 NOTE — Telephone Encounter (Signed)
Lmom to call our office. Its time to schedule an appt with Dr. Kirke Corin.

## 2013-03-21 LAB — PROTIME-INR

## 2013-03-24 ENCOUNTER — Telehealth: Payer: Self-pay | Admitting: Internal Medicine

## 2013-03-24 NOTE — Telephone Encounter (Signed)
INR is low.  She is taking 10 mg and 7.5 mg alternating, I believe  Increase to 10 mg daily and repeat IRN in 1 week

## 2013-03-25 NOTE — Telephone Encounter (Signed)
Called and advised patient of results. States she is taking 10 mg on Monday, Wednesday, Friday, and Saturday. 15 mg the other three days.

## 2013-03-25 NOTE — Telephone Encounter (Signed)
Ok then change regimen to 15 mg 5 days per week and 10 mg two days per week,  Repeat INR in 10 days

## 2013-03-25 NOTE — Telephone Encounter (Signed)
Pt notified and verbalized understanding.

## 2013-04-09 ENCOUNTER — Encounter: Payer: Self-pay | Admitting: Internal Medicine

## 2013-04-13 ENCOUNTER — Telehealth: Payer: Self-pay | Admitting: Internal Medicine

## 2013-04-13 DIAGNOSIS — I359 Nonrheumatic aortic valve disorder, unspecified: Secondary | ICD-10-CM

## 2013-04-13 NOTE — Telephone Encounter (Signed)
INR is 2.1 increase the coumadin to 15 mg 6 days per week,  10 mg one day per week..  Repeat in one month

## 2013-04-13 NOTE — Telephone Encounter (Signed)
Patient notified of results and voiced understanding. 

## 2013-04-24 ENCOUNTER — Encounter: Payer: Self-pay | Admitting: Internal Medicine

## 2013-05-02 ENCOUNTER — Other Ambulatory Visit: Payer: Self-pay | Admitting: Internal Medicine

## 2013-05-05 ENCOUNTER — Ambulatory Visit: Payer: BC Managed Care – PPO | Admitting: Internal Medicine

## 2013-05-08 ENCOUNTER — Telehealth: Payer: Self-pay | Admitting: Internal Medicine

## 2013-05-08 ENCOUNTER — Other Ambulatory Visit: Payer: Self-pay | Admitting: Internal Medicine

## 2013-05-08 MED ORDER — ALPRAZOLAM 0.25 MG PO TABS: 0.2500 mg | ORAL_TABLET | Freq: Every day | ORAL | Status: DC | PRN

## 2013-05-08 NOTE — Telephone Encounter (Signed)
Ok to refill 

## 2013-05-08 NOTE — Telephone Encounter (Signed)
Pt notified that Rx ready for pickup, and that controlled substance contract needs to be signed. Aware that appointment will need to be made for further refills

## 2013-05-08 NOTE — Telephone Encounter (Signed)
Refill for 30 days only. rx on printer. She has not been seen in nearly one year so she needs to be seen prior to any refills,   needs a  30 minute visit.

## 2013-05-08 NOTE — Telephone Encounter (Signed)
Xanax was prescribed by Dr. Lin Givens previously.  Has not taken the medication in a while but is asking to have this prescribed by Dr. Darrick Huntsman.  Says it helps calm her stomach and is under a lot of stress right now.  Says it was a small white pill, she is not sure what the dosage was.

## 2013-05-28 ENCOUNTER — Other Ambulatory Visit: Payer: Self-pay

## 2013-06-15 ENCOUNTER — Other Ambulatory Visit: Payer: Self-pay | Admitting: Internal Medicine

## 2013-06-16 ENCOUNTER — Other Ambulatory Visit: Payer: Self-pay | Admitting: *Deleted

## 2013-06-16 MED ORDER — FUROSEMIDE 20 MG PO TABS: ORAL_TABLET | ORAL | Status: DC

## 2013-06-17 ENCOUNTER — Other Ambulatory Visit: Payer: Self-pay | Admitting: *Deleted

## 2013-07-28 ENCOUNTER — Encounter: Payer: BC Managed Care – PPO | Admitting: Internal Medicine

## 2013-07-31 ENCOUNTER — Other Ambulatory Visit: Payer: Self-pay | Admitting: Internal Medicine

## 2013-08-27 NOTE — Telephone Encounter (Signed)
Patient never picked up prescription. It was shredded today

## 2013-08-28 ENCOUNTER — Encounter: Payer: BC Managed Care – PPO | Admitting: Internal Medicine

## 2013-08-28 ENCOUNTER — Encounter: Payer: Self-pay | Admitting: *Deleted

## 2013-08-28 NOTE — Progress Notes (Signed)
Patient ID: Maria Torres, female   DOB: 06/08/1946, 67 y.o.   MRN: [id number]  

## 2013-08-28 NOTE — Progress Notes (Signed)
Patient notified that she needs INR ASAP, patient voiced understanding. Order for INR draws sent to [facility] .

## 2013-09-07 ENCOUNTER — Telehealth: Payer: Self-pay | Admitting: Internal Medicine

## 2013-09-07 NOTE — Telephone Encounter (Signed)
FYI

## 2013-09-07 NOTE — Telephone Encounter (Signed)
Patient Information:  Caller Name: Aram BeechamCynthia  Phone: [phone]  Patient: Maria GammonReardon, Jonee  Gender: Female  DOB: 10-08-1945  Age: 68 Years  PCP: Ronna PolioWalker, Jennifer (Adults only)  Office Follow Up:  Does the office need to follow up with this patient?: No  Instructions For The Office: N/A   Symptoms  Reason For Call & Symptoms: Taking medication for edema and not helping. Lower legs and feet tight and are swollen up to knee- onset  for past few weeks. Last night she noticed that L leg has blotchy rash and is weeping onset 09/06/13. Takes Coumaden daily and needing INR rechecked. INR = 5 a few weeks ago in the ER when seen for severe back pain.. L leg itchy and tender to touch. Afebrile.  Reviewed Health History In EMR: Yes  Reviewed Medications In EMR: Yes  Reviewed Allergies In EMR: Yes  Reviewed Surgeries / Procedures: Yes  Date of Onset of Symptoms: 09/06/2013  Guideline(s) Used:  Leg Swelling and Edema  Disposition Per Guideline:   Go to ED Now (or to Office with PCP Approval)  Reason For Disposition Reached:   Severe swelling (e.g., swelling extends above knee, entire leg is swollen, weeping fluid)  Advice Given:  Expected Course:  If your leg swelling does not get better during the next week or if it recurs, make an appointment with your doctor.  Call Back If:  Swelling becomes worse  Swelling becomes red or painful to the touch  Calf pain occurs and becomes constant  You become worse.  Patient Will Follow Care Advice:  YES

## 2013-09-09 ENCOUNTER — Telehealth: Payer: Self-pay | Admitting: *Deleted

## 2013-09-09 MED ORDER — WARFARIN SODIUM 7.5 MG PO TABS: ORAL_TABLET | ORAL | Status: DC

## 2013-09-09 NOTE — Telephone Encounter (Signed)
Patient stated she will come here for INR appointment made for Monday. Sent refill for 7.5 mg coumadin

## 2013-09-09 NOTE — Telephone Encounter (Signed)
Patient called triage nurse and was instructed to go to ER Bilateral weeping edema reported by patient, at ER patient had her PT-INR checked was 6.8 ER MD told patient to stay off coumadin for the next three days, Patient was sent home and told to notify MD. Please advise.

## 2013-09-09 NOTE — Telephone Encounter (Signed)
i agree.  Suspend the coumadin for 3 days,  Then resume at 7.5 mg daily and repeat INR on Monday

## 2013-09-14 ENCOUNTER — Encounter: Payer: Self-pay | Admitting: Internal Medicine

## 2013-09-14 ENCOUNTER — Other Ambulatory Visit (INDEPENDENT_AMBULATORY_CARE_PROVIDER_SITE_OTHER): Payer: BC Managed Care – PPO

## 2013-09-14 DIAGNOSIS — Z7901 Long term (current) use of anticoagulants: Secondary | ICD-10-CM

## 2013-09-14 LAB — PROTIME-INR
INR: 1.7 ratio — ABNORMAL HIGH (ref 0.8–1.0)
PROTHROMBIN TIME: 17.6 s — AB (ref 10.2–12.4)

## 2013-09-15 ENCOUNTER — Telehealth: Payer: Self-pay | Admitting: *Deleted

## 2013-09-15 DIAGNOSIS — R55 Syncope and collapse: Secondary | ICD-10-CM | POA: Insufficient documentation

## 2013-09-15 DIAGNOSIS — I5032 Chronic diastolic (congestive) heart failure: Secondary | ICD-10-CM | POA: Insufficient documentation

## 2013-09-15 NOTE — Telephone Encounter (Signed)
Agree, not due to INR  patient has not been seen in a year.  Will need 30 minute visit for ER /hospital follow up.  Go ahead and reserve one now for 1-2 weeks out

## 2013-09-15 NOTE — Telephone Encounter (Signed)
Patient called today and stated she past out today in shower and just woke up advised patient did not think was INR and you are out of office that my advice is to go to ER NOW! Patient staed she would be going to ER now family member is driving her. FYI

## 2013-09-16 NOTE — Telephone Encounter (Signed)
Did not mail the unread message to pt, Maria MessierKathy spoke with the pt yesterday and she is now in the hospital

## 2013-09-16 NOTE — Telephone Encounter (Signed)
Patient has been admitted has had 2 CT and going for MRI today, patient reports that CT on 09/14/13 is showing she may have had a CVA Patient reported the MD stated to patient. Patient schedule for follow up 09/28/13.

## 2013-09-17 ENCOUNTER — Other Ambulatory Visit: Payer: Self-pay | Admitting: Internal Medicine

## 2013-09-18 NOTE — Telephone Encounter (Signed)
Refill? I see hospital followup sch 09/28/13. Last visit 09/15/12

## 2013-09-18 NOTE — Telephone Encounter (Signed)
Patient has not been seen in a year.  30 days only of the 4 requested meds.  MUST BE SEEN

## 2013-09-22 DIAGNOSIS — Z9889 Other specified postprocedural states: Secondary | ICD-10-CM | POA: Insufficient documentation

## 2013-09-22 DIAGNOSIS — R0602 Shortness of breath: Secondary | ICD-10-CM | POA: Insufficient documentation

## 2013-09-22 DIAGNOSIS — Z8774 Personal history of (corrected) congenital malformations of heart and circulatory system: Secondary | ICD-10-CM | POA: Insufficient documentation

## 2013-09-22 DIAGNOSIS — I719 Aortic aneurysm of unspecified site, without rupture: Secondary | ICD-10-CM

## 2013-09-22 HISTORY — DX: Aortic aneurysm of unspecified site, without rupture: I71.9

## 2013-09-28 ENCOUNTER — Encounter: Payer: Self-pay | Admitting: Internal Medicine

## 2013-09-28 ENCOUNTER — Ambulatory Visit (INDEPENDENT_AMBULATORY_CARE_PROVIDER_SITE_OTHER): Payer: BC Managed Care – PPO | Admitting: Internal Medicine

## 2013-09-28 ENCOUNTER — Encounter: Payer: Self-pay | Admitting: *Deleted

## 2013-09-28 ENCOUNTER — Telehealth: Payer: Self-pay | Admitting: *Deleted

## 2013-09-28 VITALS — BP 138/74 | HR 63 | Temp 97.9°F | Resp 18 | Ht 68.0 in | Wt 339.5 lb

## 2013-09-28 DIAGNOSIS — I10 Essential (primary) hypertension: Secondary | ICD-10-CM

## 2013-09-28 DIAGNOSIS — M25569 Pain in unspecified knee: Secondary | ICD-10-CM

## 2013-09-28 DIAGNOSIS — F339 Major depressive disorder, recurrent, unspecified: Secondary | ICD-10-CM

## 2013-09-28 DIAGNOSIS — M25562 Pain in left knee: Secondary | ICD-10-CM

## 2013-09-28 DIAGNOSIS — I509 Heart failure, unspecified: Secondary | ICD-10-CM

## 2013-09-28 DIAGNOSIS — E039 Hypothyroidism, unspecified: Secondary | ICD-10-CM

## 2013-09-28 DIAGNOSIS — M25561 Pain in right knee: Secondary | ICD-10-CM | POA: Insufficient documentation

## 2013-09-28 DIAGNOSIS — I359 Nonrheumatic aortic valve disorder, unspecified: Secondary | ICD-10-CM

## 2013-09-28 DIAGNOSIS — Z79899 Other long term (current) drug therapy: Secondary | ICD-10-CM

## 2013-09-28 LAB — BASIC METABOLIC PANEL WITH GFR
BUN: 18 mg/dL (ref 6–23)
CO2: 27 meq/L (ref 19–32)
Calcium: 8.7 mg/dL (ref 8.4–10.5)
Chloride: 104 meq/L (ref 96–112)
Creatinine, Ser: 1.2 mg/dL (ref 0.4–1.2)
GFR: 48.2 mL/min — ABNORMAL LOW
Glucose, Bld: 93 mg/dL (ref 70–99)
Potassium: 3.9 meq/L (ref 3.5–5.1)
Sodium: 139 meq/L (ref 135–145)

## 2013-09-28 MED ORDER — HYDROCODONE-ACETAMINOPHEN 5-325 MG PO TABS: 1.0000 | ORAL_TABLET | Freq: Four times a day (QID) | ORAL | Status: DC | PRN

## 2013-09-28 MED ORDER — ALPRAZOLAM 0.25 MG PO TABS: 0.2500 mg | ORAL_TABLET | Freq: Every day | ORAL | Status: DC | PRN

## 2013-09-28 MED ORDER — LISINOPRIL 10 MG PO TABS: 10.0000 mg | ORAL_TABLET | Freq: Every day | ORAL | Status: DC

## 2013-09-28 NOTE — Assessment & Plan Note (Addendum)
Triggered  by recent breakup by life partner, complicated by financial stressors.  Recommended to continue wellbutrin and citalopram . Prn alprazolam 30 days given for anxeity and insmonia. She is adamant about not being suicidal.

## 2013-09-28 NOTE — Telephone Encounter (Signed)
What labs and dX?  
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
## 2013-09-28 NOTE — Assessment & Plan Note (Signed)
Secondary to recent fall and extensive bruising.  NO relief with tylenol.  NSAAAIDs avoided due to concurrent use of warfarin. vicodin 30 day supply requested and given.

## 2013-09-28 NOTE — Assessment & Plan Note (Signed)
Stopping amlodipine and starting lisinopril for heart failure and mitigation of meds that might be aggravting her edema

## 2013-09-28 NOTE — Assessment & Plan Note (Signed)
I have addressed  BMI and recommended a low glycemic index diet utilizing smaller more frequent meals to increase metabolism.  I have also recommended that patient start exercising with a goal of 30 minutes of aerobic exercise a minimum of 5 days per week. Screening for lipid disorders, thyroid and diabetes has been done during recent admission

## 2013-09-28 NOTE — Assessment & Plan Note (Signed)
INR therapeutic per cardiology at 2.2  New cardiologist is handling.

## 2013-09-28 NOTE — Patient Instructions (Addendum)
Stop the amlodipine .  It is aggravating your fluid retention .  We are starting lisinopril instead  We are starting lisinopril 10 mg daily for blood pressure   We are referring you for a repeat Sleep study  To make sure your CPAP settings are correct  Return in one month   This is  One version of a  "Low GI"  Diet:  It will still lower your blood sugars and allow you to lose 4 to 8  lbs  per month if you follow it carefully.  Your goal with exercise is a minimum of 30 minutes of aerobic exercise 5 days per week (Walking does not count once it becomes easy!)    All of the foods can be found at grocery stores and in bulk at [employer].  The Atkins protein bars and shakes are available in more varieties at Target, WalMart and [employer].     7 AM Breakfast:  Choose from the following:  Low carbohydrate Protein  Shakes (I recommend the EAS AdvantEdge "Carb Control" shakes  Or the low carb shakes by Atkins.    2.5 carbs   Arnold's "Sandwhich Thin"toasted  w/ peanut butter (no jelly: about 20 net carbs  "Bagel Thin" with cream cheese and salmon: about 20 carbs   a scrambled egg/bacon/cheese burrito made with Mission's "carb balance" whole wheat tortilla  (about 10 net carbs )   Avoid cereal and bananas, oatmeal and cream of wheat and grits. They are loaded with carbohydrates!   10 AM: high protein snack  Protein bar by Atkins (the snack size, under 200 cal, usually < 6 net carbs).    A stick of cheese:  Around 1 carb,  100 cal     Dannon Light n Fit [place] Yogurt  (80 cal, 8 carbs)  Other so called "protein bars" and Greek yogurts tend to be loaded with carbohydrates.  Remember, in food advertising, the word "energy" is synonymous for " carbohydrate."  Lunch:   A Sandwich using the bread choices listed, Can use any  Eggs,  lunchmeat, grilled meat or canned tuna), avocado, regular mayo/mustard  and cheese.  A Salad using blue cheese, ranch,  Goddess or vinagrette,  No croutons or "confetti"  and no "candied nuts" but regular nuts OK.   No pretzels or chips.  Pickles and miniature sweet peppers are a good low carb alternative that provide a "crunch"  The bread is the only source of carbohydrate in a sandwich and  can be decreased by trying some of these alternatives to traditional loaf bread  Joseph's makes a pita bread and a flat bread that are 50 cal and 4 net carbs available at BJs and WalMart.  This can be toasted to use with hummous as well  Toufayan makes a low carb flatbread that's 100 cal and 9 net carbs available at [employer] and [employer] makes 2 sizes of  Low carb whole wheat tortilla  (The large one is 210 cal and 6 net carbs) Avoid "Low fat dressings, as well as Reyne Dumas and [address] dressings They are loaded with sugar!   3 PM/ Mid day  Snack:  Consider  1 ounce of  almonds, walnuts, pistachios, pecans, peanuts,  Macadamia nuts or a nut medley.  Avoid "granola"; the dried cranberries and raisins are loaded with carbohydrates. Mixed nuts as long as there are no raisins,  cranberries or dried fruit.     6 PM  Dinner:  Meat/fowl/fish with a green salad, and either broccoli, cauliflower, green beans, spinach, brussel sprouts or  Lima beans. DO NOT BREAD THE PROTEIN!!      There is a low carb pasta by Dreamfield's that is acceptable and tastes great: only 5 digestible carbs/serving.( All grocery stores but BJs carry it )  Try Kai LevinsMichel Angelo's chicken piccata or chicken or eggplant parm over low carb pasta.(Lowes and BJs)   Clifton CustardAaron Sanchez's "Carnitas" (pulled pork, no sauce,  0 carbs) or his beef pot roast to make a dinner burrito (at BJ's)  Pesto over low carb pasta (bj's sells a good quality pesto in the center refrigerated section of the deli   Whole wheat pasta is still full of digestible carbs and  Not as low in glycemic index as Dreamfield's.   Brown rice is still rice,  So skip the rice and noodles if you eat Congohinese or New Zealandhai (or at least limit to 1/2  cup)  9 PM snack :   Breyer's "low carb" fudgsicle or  ice cream bar (Carb Smart line), or  Weight Watcher's ice cream bar , or another "no sugar added" ice cream;  a serving of fresh berries/cherries with whipped cream   Cheese or DANNON'S LlGHT N FIT [place] YOGURT  Avoid bananas, pineapple, grapes  and watermelon on a regular basis because they are high in sugar.  THINK OF THEM AS DESSERT  Remember that snack Substitutions should be less than 10 NET carbs per serving and meals < 20 carbs. Remember to subtract fiber grams to get the "net carbs."

## 2013-09-28 NOTE — Assessment & Plan Note (Signed)
Now managed vy [facility] Dr Carmela Rimaarrie Ward .  Stopping amlodipine and starting lisinopril to help reduce the LE edema she is currenlty having.  ECHO to be repeated this week.

## 2013-09-28 NOTE — Assessment & Plan Note (Signed)
TSH was 9 recently.  Not sure if cardiology addressed ,  Will incrase dose if they did not and repeat in 6 weeks

## 2013-09-29 ENCOUNTER — Telehealth: Payer: Self-pay | Admitting: Internal Medicine

## 2013-09-29 ENCOUNTER — Encounter: Payer: Self-pay | Admitting: Internal Medicine

## 2013-09-29 NOTE — Telephone Encounter (Signed)
Relevant patient education assigned to patient using Emmi. ° °

## 2013-10-01 ENCOUNTER — Telehealth: Payer: Self-pay | Admitting: *Deleted

## 2013-10-01 NOTE — Telephone Encounter (Signed)
Called to schedule another echo. Patient informed me that [place] is seeing a cardiologist in [place] so she wont have far to drive.

## 2013-10-02 MED ORDER — POTASSIUM CHLORIDE CRYS ER 10 MEQ PO TBCR: EXTENDED_RELEASE_TABLET | ORAL | Status: DC

## 2013-10-03 DIAGNOSIS — G4733 Obstructive sleep apnea (adult) (pediatric): Secondary | ICD-10-CM | POA: Insufficient documentation

## 2013-10-12 ENCOUNTER — Telehealth: Payer: Self-pay | Admitting: Internal Medicine

## 2013-10-12 DIAGNOSIS — E039 Hypothyroidism, unspecified: Secondary | ICD-10-CM

## 2013-10-12 NOTE — Assessment & Plan Note (Signed)
TSH was 9.47 last week by [facility].  hecking with patien to see if they [facility] increased her synthroid dose?

## 2013-10-12 NOTE — Telephone Encounter (Signed)
TSH was 9.47 last week by [facility].  hecking with patien to see if they [facility] increased her synthroid dose? 

## 2013-10-12 NOTE — Telephone Encounter (Signed)
Left message for patient to return call to office. 

## 2013-10-19 ENCOUNTER — Encounter: Payer: Self-pay | Admitting: *Deleted

## 2013-10-19 NOTE — Telephone Encounter (Signed)
Have not been able to reach patient by phone letter mailed.

## 2013-10-22 DIAGNOSIS — I5033 Acute on chronic diastolic (congestive) heart failure: Secondary | ICD-10-CM | POA: Insufficient documentation

## 2013-10-26 ENCOUNTER — Telehealth: Payer: Self-pay | Admitting: *Deleted

## 2013-10-26 ENCOUNTER — Encounter: Payer: Self-pay | Admitting: Internal Medicine

## 2013-10-26 NOTE — Telephone Encounter (Signed)
Patient went to ER for Fluid build up in chest and chest pain , patient was released on a loop monitor and patient stated that she has had severe chest pain that is being monitored for by [facility]. A friend has suggested whom is an NP for patient to have a blood test called to test for H Pylori. Would like to know if we could do this . Scheduled patient for a OV tomorrow at 11 am with you and advised patient to return to ER if symptoms become worse. FYI

## 2013-10-27 ENCOUNTER — Encounter: Payer: Self-pay | Admitting: Internal Medicine

## 2013-10-27 ENCOUNTER — Ambulatory Visit (INDEPENDENT_AMBULATORY_CARE_PROVIDER_SITE_OTHER): Payer: BC Managed Care – PPO | Admitting: Internal Medicine

## 2013-10-27 ENCOUNTER — Telehealth: Payer: Self-pay | Admitting: Internal Medicine

## 2013-10-27 VITALS — BP 100/58 | HR 77 | Temp 98.4°F | Resp 16 | Wt 316.8 lb

## 2013-10-27 DIAGNOSIS — K209 Esophagitis, unspecified without bleeding: Secondary | ICD-10-CM

## 2013-10-27 DIAGNOSIS — N179 Acute kidney failure, unspecified: Secondary | ICD-10-CM

## 2013-10-27 DIAGNOSIS — Z7901 Long term (current) use of anticoagulants: Secondary | ICD-10-CM

## 2013-10-27 DIAGNOSIS — R079 Chest pain, unspecified: Secondary | ICD-10-CM

## 2013-10-27 DIAGNOSIS — E876 Hypokalemia: Secondary | ICD-10-CM

## 2013-10-27 DIAGNOSIS — I509 Heart failure, unspecified: Secondary | ICD-10-CM

## 2013-10-27 LAB — PROTIME-INR
INR: 2.2 ratio — AB (ref 0.8–1.0)
Prothrombin Time: 23.3 s — ABNORMAL HIGH (ref 10.2–12.4)

## 2013-10-27 LAB — COMPREHENSIVE METABOLIC PANEL
ALBUMIN: 4.3 g/dL (ref 3.5–5.2)
ALT: 20 U/L (ref 0–35)
AST: 16 U/L (ref 0–37)
Alkaline Phosphatase: 64 U/L (ref 39–117)
BUN: 38 mg/dL — AB (ref 6–23)
CALCIUM: 8.8 mg/dL (ref 8.4–10.5)
CHLORIDE: 101 meq/L (ref 96–112)
CO2: 28 meq/L (ref 19–32)
CREATININE: 1.9 mg/dL — AB (ref 0.4–1.2)
GFR: 28.26 mL/min — AB (ref >60.00)
Glucose, Bld: 77 mg/dL (ref 70–99)
Potassium: 4.2 mEq/L (ref 3.5–5.1)
Sodium: 138 mEq/L (ref 135–145)
Total Bilirubin: 0.7 mg/dL (ref 0.3–1.2)
Total Protein: 7.3 g/dL (ref 6.0–8.3)

## 2013-10-27 LAB — H. PYLORI ANTIBODY, IGG: H PYLORI IGG: NEGATIVE

## 2013-10-27 MED ORDER — DEXLANSOPRAZOLE 60 MG PO CPDR: 60.0000 mg | DELAYED_RELEASE_CAPSULE | Freq: Every day | ORAL | Status: DC

## 2013-10-27 MED ORDER — HYOSCYAMINE SULFATE 0.125 MG SL SUBL: 0.1250 mg | SUBLINGUAL_TABLET | SUBLINGUAL | Status: DC | PRN

## 2013-10-27 NOTE — Progress Notes (Signed)
Patient ID: Maria Torres, female   DOB: 05/04/1946, 68 y.o.   MRN: [id number]   Patient Active Problem List   Diagnosis Date Noted  . Chest pain, unspecified 10/27/2013  . Depression, major, recurrent 09/28/2013  . Knee pain, bilateral 09/28/2013  . Routine general medical examination at a health care facility 07/26/2012  . Migraine 03/27/2012  . Obesity, Class III, BMI 40-49.9 (morbid obesity) 11/20/2011  . Post menopausal syndrome 11/20/2011  . Headache 10/08/2011  . Hypothyroidism 10/08/2011  . Aortic valve disorder 10/08/2011  . Benign hypertension 10/08/2011  . Mitral valve disorder 10/08/2011  . Anemia 10/08/2011  . Hyperlipidemia 10/08/2011  . Hypersomnia with sleep apnea 10/08/2011  . Acute diastolic CHF (congestive heart failure) 10/08/2011    Subjective:  CC:   Chief Complaint  Patient presents with  . Follow-up    HPI:   Maria Torres is a 68 y.o. female who presents for Hospital follow up,  This was her  Third d hospitalization in 6 weeks   Was admitted and diuresed, sent home on Saturday,   Wt is down  10 lbs on torsemide 60  And spironolactone 25  ,  Weighing herself daily .  Using her CPAP regularly but was not using it until recently.   Hx: went to ER on Thursday for chest pain radiating to shoulder blades,   Ruled out for heart attack.  GI was not evaluated,  GI follow up was done as an outpatient,  Stress test was metioned but done sceduleded  Last cath was normal in 2008 prior to AVR in 2009   taking prevacid in the morning with water 30 minutes prior to food. Watching sodium,  Avoiding Carbonation,  Eating protein shakes..   Last EGD was done during colonoscopy ([facility]) 2012. [facility] done for reflux    Past Medical History  Diagnosis Date  . Morbid obesity   . Hx of glaucoma   . Thyroid disease     hypothyrodism  . GERD (gastroesophageal reflux disease)   . Hypertension   . Aortic stenosis     Due to bicuspid aortic valve. 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 Obstructive sleep apnea on CPAP   . Anemia   . Headache(784.0)   . Cervicalgia   . Mitral valve disorder   . Hyperlipidemia   . CHF (congestive heart failure)     Past Surgical History  Procedure Laterality Date  . Cholecystectomy    . Bilateral arthroscopic knee surgery  2001 & 2002  . Gastric banding surgery      @ [place] 04-16-96  . Unilateral total knee replacement  08/2002  . Total left hip replacement  01-20-2007  . 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 Cardiac catheterization  2004    [facility]  . Cardiac catheterization  2008    [facility]       The following portions of the patient's history were reviewed and updated as appropriate: Allergies, current medications, and problem list.    Review of Systems:   Patient denies headache, fevers, malaise, unintentional weight loss, skin rash, eye pain, sinus congestion and sinus pain, sore throat, dysphagia,  hemoptysis , cough, dyspnea, wheezing, chest pain, palpitations, orthopnea, edema, abdominal pain, nausea, melena, diarrhea, constipation, flank pain, dysuria, hematuria, urinary  Frequency, nocturia, numbness, tingling, seizures,  Focal weakness,   Loss of consciousness,  Tremor, insomnia, depression, anxiety, and suicidal ideation.     History   Social History  . Marital Status: Single    Spouse Name: N/A    Number of Children: N/A  . Years of Education: N/A   Occupational History  . Not on file.   Social History Main Topics  . Smoking status: Never Smoker   . Smokeless tobacco: Never Used  . Alcohol Use: Yes  . Drug Use: No  . Sexual Activity: Not on file   Other Topics Concern  . Not on file   Social History Narrative  . No narrative on file    Objective:  Filed Vitals:   10/27/13 1115  BP: 100/58  Pulse: 77  Temp: 98.4 F (36.9 C)   Resp: 16     General appearance: alert, cooperative and appears stated age Ears: normal TM's and external ear canals both ears Throat: lips, mucosa, and tongue normal; teeth and gums normal Neck: no adenopathy, no carotid bruit, supple, symmetrical, trachea midline and thyroid not enlarged, symmetric, no tenderness/mass/nodules Back: symmetric, no curvature. ROM normal. No CVA tenderness. Lungs: clear to auscultation bilaterally Heart: regular rate and rhythm, S1, S2 normal, no murmur, click, rub or gallop Abdomen: soft, non-tender; bowel sounds normal; no masses,  no organomegaly Pulses: 2+ and symmetric Skin: Skin color, texture, turgor normal. No rashes or lesions Lymph nodes: Cervical, supraclavicular, and axillary nodes normal.  Assessment and Plan:  Acute diastolic CHF (congestive heart failure) 3rd admission ,  Now diuresed on torsemide and spironolactone.  May have right sided failure form untreated OSA. ECHO done 08/2013 showed concentric hypertrophy. impaired relaxation  Ef 555  Chest pain, unspecified With recent rule out by cardiac enzymes.  Will treat for esophagitis.  dexilant and levsin.    Updated Medication List Outpatient Encounter Prescriptions as of 10/27/2013  Medication Sig  . albuterol (PROVENTIL HFA;VENTOLIN HFA) 108 (90 BASE) MCG/ACT inhaler Inhale 3 puffs into the lungs 4 (four) times daily.  . ALPRAZolam (XANAX) 0.25 MG tablet Take 1 tablet (0.25 mg total) by mouth daily as needed for sleep or anxiety.  . budesonide-formoterol (SYMBICORT) 160-4.5 MCG/ACT inhaler Inhale 2 puffs into the lungs 2 (two) times daily.  . buPROPion (WELLBUTRIN XL) 300 MG 24 hr tablet take 1 tablet by mouth once daily  . carvedilol (COREG) 3.125 MG tablet Take 1 tablet by mouth 2 (two) times daily.  . citalopram (CELEXA) 40 MG tablet take 1/2 tablet by mouth once daily INCREASING TO 1 TAB DAILY AFTER 7 DAYS  . HYDROcodone-acetaminophen (NORCO/VICODIN) 5-325 MG per tablet Take 1  tablet by mouth every 6 (six) hours as needed for moderate pain.  . HYDROcodone-homatropine (HYCODAN) 5-1.5 MG/5ML syrup Take 5 mLs by mouth every 6 (six) hours as needed for cough.  . levothyroxine (SYNTHROID, LEVOTHROID) 137 MCG tablet Take 150 mcg by mouth daily.  . lisinopril (PRINIVIL,ZESTRIL) 10 MG tablet Take 1 tablet (10 mg total) by mouth daily.  . omeprazole (PRILOSEC) 20 MG capsule Take 40 mg by mouth daily. NEED TO KEEP APPT IN February FOR FURTHER REFILLS  . topiramate (TOPAMAX) 50 MG tablet take 1 tablet twice a day  . torsemide (DEMADEX) 20 MG tablet Take 60 mg by mouth daily.  . traMADol (ULTRAM) 50 MG tablet Take 50 mg by mouth every 6 (six) hours as needed.  . warfarin (COUMADIN) 10 MG tablet TAKE 1 TABLET BY MOUTH AS DIRECTED.  . [DISCONTINUED] levothyroxine (SYNTHROID, LEVOTHROID) 137 MCG tablet Take   1 tablet (137 mcg total) by mouth daily.  . [DISCONTINUED] omeprazole (PRILOSEC) 20 MG capsule Take 1 capsule (20 mg total) by mouth daily. NEED TO KEEP APPT IN February FOR FURTHER REFILLS  . amLODipine (NORVASC) 10 MG tablet take 1 tablet by mouth once daily  . amoxicillin (AMOXIL) 250 MG capsule Take 250 mg by mouth 3 (three) times daily. Before dental procedures  . dexlansoprazole (DEXILANT) 60 MG capsule Take 1 capsule (60 mg total) by mouth daily.  . eletriptan (RELPAX) 40 MG tablet One tablet by mouth at onset of headache. May repeat in 2 hours if headache persists or recurs. may repeat in 2 hours if necessary  . furosemide (LASIX) 20 MG tablet take 1 tablet by mouth once daily if needed for edema  . furosemide (LASIX) 20 MG tablet Take 40 mg by mouth daily. As needed for edema  . hydrochlorothiazide (HYDRODIURIL) 25 MG tablet take 1 tablet by mouth once daily  . hyoscyamine (LEVSIN SL) 0.125 MG SL tablet Place 1 tablet (0.125 mg total) under the tongue every 4 (four) hours as needed. For esophageal spasm  . nystatin (MYCOSTATIN) powder Apply topically 4 (four) times daily.   . potassium chloride (KLOR-CON M10) 10 MEQ tablet take 2 tablets by mouth once daily when taking furosemide  . predniSONE (DELTASONE) 10 MG tablet 6 tablets daily for 3 days, then taper by 10 mg daily until gone  . promethazine (PHENERGAN) 12.5 MG tablet Take 1 tablet (12.5 mg total) by mouth every 8 (eight) hours as needed for nausea.  . zolmitriptan (ZOMIG) 5 MG nasal solution Place 1 spray into the nose as needed for migraine.  . zoster vaccine live, PF, (ZOSTAVAX) 19400 UNT/0.65ML injection Inject 19,400 Units into the skin once.     Orders Placed This Encounter  Procedures  . Comp Met (CMET)  . INR/PT  . H. pylori antibody, IgG    No Follow-up on file.       

## 2013-10-27 NOTE — Telephone Encounter (Signed)
error 

## 2013-10-27 NOTE — Patient Instructions (Addendum)
2 changes today:  Trial of Dexilant instead of Prevacid for treatment of presumed reflux esophagitis  addition of "as needed " Levsin for esophageal spasm.  dissolve under tongue  If no imporvement.  GI referral for EGD

## 2013-10-27 NOTE — Assessment & Plan Note (Addendum)
3rd admission ,  Now diuresed on torsemide and spironolactone.  May have right sided failure form untreated OSA. ECHO done 08/2013 showed concentric hypertrophy. impaired relaxation  Ef 555

## 2013-10-27 NOTE — Assessment & Plan Note (Signed)
With recent rule out by cardiac enzymes.  Will treat for esophagitis.  dexilant and levsin.

## 2013-10-28 ENCOUNTER — Encounter: Payer: Self-pay | Admitting: Internal Medicine

## 2013-10-28 DIAGNOSIS — N179 Acute kidney failure, unspecified: Secondary | ICD-10-CM | POA: Insufficient documentation

## 2013-10-28 DIAGNOSIS — N183 Chronic kidney disease, stage 3 unspecified: Secondary | ICD-10-CM

## 2013-10-28 DIAGNOSIS — N189 Chronic kidney disease, unspecified: Secondary | ICD-10-CM | POA: Insufficient documentation

## 2013-10-30 DIAGNOSIS — Z0279 Encounter for issue of other medical certificate: Secondary | ICD-10-CM

## 2013-11-02 ENCOUNTER — Ambulatory Visit: Payer: BC Managed Care – PPO | Admitting: Internal Medicine

## 2013-11-04 ENCOUNTER — Encounter: Payer: Self-pay | Admitting: *Deleted

## 2013-11-04 NOTE — Progress Notes (Signed)
Patient ID: Maria GammonCynthia Torres, female   DOB: Oct 28, 1945, 68 y.o.   MRN: [id number]

## 2013-11-04 NOTE — Progress Notes (Signed)
Patient FMLA paper work faxed to patient as requested and original on desk awaiting patient appointment. Also copy for chart and billing completed.

## 2013-11-05 ENCOUNTER — Other Ambulatory Visit: Payer: BC Managed Care – PPO

## 2013-11-06 ENCOUNTER — Encounter: Payer: Self-pay | Admitting: Internal Medicine

## 2013-11-06 ENCOUNTER — Ambulatory Visit (INDEPENDENT_AMBULATORY_CARE_PROVIDER_SITE_OTHER): Payer: BC Managed Care – PPO | Admitting: Internal Medicine

## 2013-11-06 VITALS — BP 118/60 | HR 66 | Temp 97.7°F | Resp 16 | Wt 318.5 lb

## 2013-11-06 DIAGNOSIS — G4733 Obstructive sleep apnea (adult) (pediatric): Secondary | ICD-10-CM

## 2013-11-06 DIAGNOSIS — I059 Rheumatic mitral valve disease, unspecified: Secondary | ICD-10-CM

## 2013-11-06 DIAGNOSIS — G894 Chronic pain syndrome: Secondary | ICD-10-CM

## 2013-11-06 DIAGNOSIS — R55 Syncope and collapse: Secondary | ICD-10-CM

## 2013-11-06 DIAGNOSIS — Z7901 Long term (current) use of anticoagulants: Secondary | ICD-10-CM

## 2013-11-06 DIAGNOSIS — E039 Hypothyroidism, unspecified: Secondary | ICD-10-CM

## 2013-11-06 DIAGNOSIS — Z9989 Dependence on other enabling machines and devices: Secondary | ICD-10-CM

## 2013-11-06 DIAGNOSIS — R609 Edema, unspecified: Secondary | ICD-10-CM

## 2013-11-06 DIAGNOSIS — N179 Acute kidney failure, unspecified: Secondary | ICD-10-CM

## 2013-11-06 LAB — MAGNESIUM: MAGNESIUM: 2.3 mg/dL (ref 1.5–2.5)

## 2013-11-06 LAB — BASIC METABOLIC PANEL
BUN: 50 mg/dL — ABNORMAL HIGH (ref 6–23)
CO2: 23 mEq/L (ref 19–32)
CREATININE: 2.3 mg/dL — AB (ref 0.4–1.2)
Calcium: 8.9 mg/dL (ref 8.4–10.5)
Chloride: 109 mEq/L (ref 96–112)
GFR: 22.64 mL/min — AB (ref >60.00)
GLUCOSE: 75 mg/dL (ref 70–99)
Potassium: 4.3 mEq/L (ref 3.5–5.1)
Sodium: 142 mEq/L (ref 135–145)

## 2013-11-06 LAB — PROTIME-INR
INR: 3.1 ratio — ABNORMAL HIGH (ref 0.8–1.0)
Prothrombin Time: 33.8 s — ABNORMAL HIGH (ref 10.2–12.4)

## 2013-11-06 LAB — TSH: TSH: 16.21 u[IU]/mL — ABNORMAL HIGH (ref 0.35–5.50)

## 2013-11-06 NOTE — Progress Notes (Signed)
Patient ID: Maria GammonCynthia Grissom, female   DOB: Aug 16, 1945, 68 y.o.   MRN: [id number]   Patient Active Problem List   Diagnosis Date Noted  . Anemia 10/08/2011    Priority: Low  . Syncope and collapse 11/08/2013  . Edema 11/08/2013  . Acute renal failure 10/28/2013  . Chest pain, unspecified 10/27/2013  . Depression, major, recurrent 09/28/2013  . Knee pain, bilateral 09/28/2013  . Routine general medical examination at a health care facility 07/26/2012  . Migraine 03/27/2012  . Obesity, Class III, BMI 40-49.9 (morbid obesity) 11/20/2011  . Post menopausal syndrome 11/20/2011  . Headache 10/08/2011  . Hypothyroidism 10/08/2011  . Aortic valve disorder 10/08/2011  . Benign hypertension 10/08/2011  . Mitral valve disorder 10/08/2011  . Hyperlipidemia 10/08/2011  . OSA on CPAP 10/08/2011  . Acute diastolic CHF (congestive heart failure) 10/08/2011    Subjective:  CC:   Chief Complaint  Patient presents with  . Follow-up    labs    HPI:   Maria Torres is a 68 y.o. female who presents for 2 week Follow up on multiple  Issues including edema, Renal insufficiency, recurrent chest pain and orthopnea. With 3 hospitalizations in the last 2 months.    Gained 4 lbs overnight .  Limiting fluids to 1 liter daily  On her own.  cardiologist advised 2 g sodium restrictive diet.   She has addressed her obesity with dietary changes and has started Drinking protein shakes,  Herbal life shakes.    Having nocturnal calf cramps.      Past Medical History  Diagnosis Date  . Morbid obesity   . Hx of glaucoma   . Thyroid disease     hypothyrodism  . GERD (gastroesophageal reflux disease)   . Hypertension   . Aortic stenosis     Due to bicuspid aortic valve. 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 Obstructive sleep apnea on CPAP   . Anemia   . Headache(784.0)   . Cervicalgia   .  Mitral valve disorder   . Hyperlipidemia   . CHF (congestive heart failure)     Past Surgical History  Procedure Laterality Date  . Cholecystectomy    . Bilateral arthroscopic knee surgery  2001 & 2002  . Gastric banding surgery      @ [place] 04-16-96  . Unilateral total knee replacement  08/2002  . Total left hip replacement  01-20-2007  . 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 Cardiac catheterization  2004    [facility]  . [facility]       The following portions of the patient's history were reviewed and updated as appropriate: Allergies, current medications, and problem list.    Review of Systems:   Patient denies headache, fevers, malaise, unintentional weight loss, skin rash, eye pain, sinus congestion and sinus pain, sore throat, dysphagia,  hemoptysis , cough, dyspnea, wheezing, chest pain, palpitations, orthopnea, edema, abdominal pain, nausea, melena, diarrhea, constipation, flank pain, dysuria, hematuria, urinary  Frequency, nocturia, numbness, tingling, seizures,  Focal weakness, Loss of consciousness,  Tremor, insomnia, depression, anxiety, and suicidal ideation.     History   Social History  . Marital Status: Single    Spouse Name: N/A    Number of Children: N/A  . Years of Education: N/A  Occupational History  . Not on file.   Social History Main Topics  . Smoking status: Never Smoker   . Smokeless tobacco: Never Used  . Alcohol Use: Yes  . Drug Use: No  . Sexual Activity: Not on file   Other Topics Concern  . Not on file   Social History Narrative  . No narrative on file    Objective:  Filed Vitals:   11/06/13 1115  BP: 118/60  Pulse: 66  Temp: 97.7 F (36.5 C)  Resp: 16     General appearance: alert, cooperative and appears stated age Ears: normal TM's and external ear canals both ears Throat: lips, mucosa, and tongue normal; teeth and gums normal Neck: no adenopathy, no  carotid bruit, supple, symmetrical, trachea midline and thyroid not enlarged, symmetric, no tenderness/mass/nodules Back: symmetric, no curvature. ROM normal. No CVA tenderness. Lungs: clear to auscultation bilaterally Heart: regular rate and rhythm, S1, S2 normal, no murmur, click, rub or gallop Abdomen: soft, non-tender; bowel sounds normal; no masses,  no organomegaly Pulses: 2+ and symmetric Skin: Skin color, texture, turgor normal. No rashes or lesions Lymph nodes: Cervical, supraclavicular, and axillary nodes normal.  Assessment and Plan:  Acute renal failure Secondary to over diuresis.  Attempts to suspend diuretics result in significant wt gain overnight due to concurrent issues of OSA and diastolic dysfunction .  I suspect she has lymphedema vs nephrotic syndrome. referals to [facility] and nephrology are in process..  OSA on CPAP She has been wearing her CPAP every night since her admission.  It is not clear whether her settings are adequate given her recurrent edema.    Hypothyroidism Thyroid is very underactive on current dose.  Will confirm current dose has bee taken correctly for the last 6 weeks.   Syncope and collapse Etiology attributed to orthostasis by [facility]  3/14 to 3/17 with negative Holter monitor,  Normal TTE, normal aortic valve, CT negative for PE and normal head CT and MRI   Edema Etiology unclear.  20 lb wt gain over the last several months which recures whenever her diuretic is suspended (due to acute rises in Cr,  Now currently in Acute renal failure again with Cr 2.3). She has a normal EF by recent TTE.  She has OSA which is currently treated. Amlodipine was stopped.  refferal to nephrology and [facility] in progress.  Considering  lymphedema secondary to morbid obesity.    Updated Medication List Outpatient Encounter Prescriptions as of 11/06/2013  Medication Sig  . albuterol (PROVENTIL HFA;VENTOLIN HFA) 108 (90 BASE) MCG/ACT inhaler  Inhale 3 puffs into the lungs 4 (four) times daily.  Marland Kitchen. ALPRAZolam (XANAX) 0.25 MG tablet Take 1 tablet (0.25 mg total) by mouth daily as needed for sleep or anxiety.  Marland Kitchen. amoxicillin (AMOXIL) 250 MG capsule Take 250 mg by mouth 3 (three) times daily. Before dental procedures  . budesonide-formoterol (SYMBICORT) 160-4.5 MCG/ACT inhaler Inhale 2 puffs into the lungs 2 (two) times daily.  Marland Kitchen. buPROPion (WELLBUTRIN XL) 300 MG 24 hr tablet take 1 tablet by mouth once daily  . carvedilol (COREG) 3.125 MG tablet Take 1 tablet by mouth 2 (two) times daily.  . citalopram (CELEXA) 40 MG tablet take 1/2 tablet by mouth once daily INCREASING TO 1 TAB DAILY AFTER 7 DAYS  . dexlansoprazole (DEXILANT) 60 MG capsule Take 1 capsule (60 mg total) by mouth daily.  Marland Kitchen. HYDROcodone-acetaminophen (NORCO/VICODIN) 5-325 MG per tablet Take 1 tablet by mouth every 6 (six)  hours as needed for moderate pain.  Marland Kitchen HYDROcodone-homatropine (HYCODAN) 5-1.5 MG/5ML syrup Take 5 mLs by mouth every 6 (six) hours as needed for cough.  . hyoscyamine (LEVSIN SL) 0.125 MG SL tablet Place 1 tablet (0.125 mg total) under the tongue every 4 (four) hours as needed. For esophageal spasm  . levothyroxine (SYNTHROID, LEVOTHROID) 137 MCG tablet Take 150 mcg by mouth daily.  Marland Kitchen lisinopril (PRINIVIL,ZESTRIL) 10 MG tablet Take 1 tablet (10 mg total) by mouth daily.  Marland Kitchen nystatin (MYCOSTATIN) powder Apply topically 4 (four) times daily.  Marland Kitchen omeprazole (PRILOSEC) 20 MG capsule Take 40 mg by mouth daily. NEED TO KEEP APPT IN February FOR FURTHER REFILLS  . potassium chloride (KLOR-CON M10) 10 MEQ tablet take 2 tablets by mouth once daily when taking furosemide  . predniSONE (DELTASONE) 10 MG tablet 6 tablets daily for 3 days, then taper by 10 mg daily until gone  . topiramate (TOPAMAX) 50 MG tablet take 1 tablet twice a day  . torsemide (DEMADEX) 20 MG tablet Take 60 mg by mouth daily.  . traMADol (ULTRAM) 50 MG tablet Take 50 mg by mouth every 6 (six) hours as  needed.  . warfarin (COUMADIN) 10 MG tablet TAKE 1 TABLET BY MOUTH AS DIRECTED.  Marland Kitchen zoster vaccine live, PF, (ZOSTAVAX) 24401 UNT/0.65ML injection Inject 19,400 Units into the skin once.  . [DISCONTINUED] amLODipine (NORVASC) 10 MG tablet take 1 tablet by mouth once daily  . [DISCONTINUED] furosemide (LASIX) 20 MG tablet take 1 tablet by mouth once daily if needed for edema  . [DISCONTINUED] furosemide (LASIX) 20 MG tablet Take 40 mg by mouth daily. As needed for edema  . [DISCONTINUED] hydrochlorothiazide (HYDRODIURIL) 25 MG tablet take 1 tablet by mouth once daily  . eletriptan (RELPAX) 40 MG tablet One tablet by mouth at onset of headache. May repeat in 2 hours if headache persists or recurs. may repeat in 2 hours if necessary  . promethazine (PHENERGAN) 12.5 MG tablet Take 1 tablet (12.5 mg total) by mouth every 8 (eight) hours as needed for nausea.  Marland Kitchen zolmitriptan (ZOMIG) 5 MG nasal solution Place 1 spray into the nose as needed for migraine.     Orders Placed This Encounter  Procedures  . TSH  . Basic metabolic panel  . Magnesium  . Protime-INR  . Ambulatory referral to Vascular Surgery  . POCT Urine Drug Screen    No Follow-up on file.

## 2013-11-06 NOTE — Patient Instructions (Addendum)
  We are repeating your potassium. Cr, INR today  Referral to [facility] to rule out other non cardiologic causes of your leg edema (lypmhedema and venous insufficiency) which will respond to manual compression  Try stretching your calf muscles using  A Pilates tension band after your shower, to prevent cramps :   while lying down, place arch of foot in the band (like a stirrup) and gently pull the sides of the band toward you and hold for as long as 15 seconds   I totally agree with a LOA from 4/22 to 5/29 while all of your issues are being sorted out and clearly defined

## 2013-11-06 NOTE — Progress Notes (Signed)
Pre-visit discussion using our clinic review tool. No additional management support is needed unless otherwise documented below in the visit note.  

## 2013-11-08 ENCOUNTER — Encounter: Payer: Self-pay | Admitting: Internal Medicine

## 2013-11-08 DIAGNOSIS — R609 Edema, unspecified: Secondary | ICD-10-CM

## 2013-11-08 DIAGNOSIS — R55 Syncope and collapse: Secondary | ICD-10-CM | POA: Insufficient documentation

## 2013-11-08 NOTE — Assessment & Plan Note (Addendum)
Secondary to over diuresis.  Attempts to suspend diuretics result in significant wt gain overnight due to concurrent issues of OSA and diastolic dysfunction .  I suspect she has lymphedema vs nephrotic syndrome. referals to [facility] and nephrology are in process..Marland Kitchen

## 2013-11-08 NOTE — Assessment & Plan Note (Signed)
Etiology attributed to orthostasis by [facility]  3/14 to 3/17 with negative Holter monitor,  Normal TTE, normal aortic valve, CT negative for PE and normal head CT and MRI

## 2013-11-08 NOTE — Assessment & Plan Note (Addendum)
Etiology unclear.  20 lb wt gain over the last several months which recures whenever her diuretic is suspended (due to acute rises in Cr,  Now currently in Acute renal failure again with Cr 2.3). She has a normal EF by recent TTE.  She has OSA which is currently treated. Amlodipine was stopped.  refferal to nephrology and [facility] in progress.  Considering  lymphedema secondary to morbid obesity.

## 2013-11-08 NOTE — Assessment & Plan Note (Signed)
She has been wearing her CPAP every night since her admission.  It is not clear whether her settings are adequate given her recurrent edema.

## 2013-11-08 NOTE — Assessment & Plan Note (Signed)
Thyroid is very underactive on current dose.  Will confirm current dose has bee taken correctly for the last 6 weeks.

## 2013-11-10 ENCOUNTER — Telehealth: Payer: Self-pay | Admitting: Internal Medicine

## 2013-11-10 NOTE — Telephone Encounter (Signed)
Patient called requesting start ate for FMLA be moved to 10/26/13 due to patient was out of work starting at this date. Patient ask to be completed today advised patient physician not in office and will complete if possible patient deadline is today.Revised form to be faxed to [name] 1 [phone].

## 2013-11-10 NOTE — Telephone Encounter (Signed)
Patient is faxing over already sent my copy to scan.

## 2013-11-10 NOTE — Telephone Encounter (Signed)
Do we have the form tht needs revision?

## 2013-11-10 NOTE — Telephone Encounter (Signed)
Revised and faxed as patient requested.

## 2013-11-11 MED ORDER — LEVOTHYROXINE SODIUM 150 MCG PO TABS: 150.0000 ug | ORAL_TABLET | Freq: Every day | ORAL | Status: DC

## 2013-11-17 ENCOUNTER — Encounter: Payer: Self-pay | Admitting: Internal Medicine

## 2013-11-25 ENCOUNTER — Other Ambulatory Visit: Payer: Self-pay | Admitting: Internal Medicine

## 2013-12-16 ENCOUNTER — Ambulatory Visit: Payer: BC Managed Care – PPO | Admitting: Internal Medicine

## 2013-12-18 ENCOUNTER — Encounter: Payer: Self-pay | Admitting: Internal Medicine

## 2013-12-18 ENCOUNTER — Ambulatory Visit (INDEPENDENT_AMBULATORY_CARE_PROVIDER_SITE_OTHER): Payer: BC Managed Care – PPO | Admitting: Internal Medicine

## 2013-12-18 VITALS — BP 138/76 | HR 79 | Temp 98.3°F | Resp 16 | Ht 68.0 in | Wt 325.8 lb

## 2013-12-18 DIAGNOSIS — E039 Hypothyroidism, unspecified: Secondary | ICD-10-CM

## 2013-12-18 DIAGNOSIS — G4733 Obstructive sleep apnea (adult) (pediatric): Secondary | ICD-10-CM

## 2013-12-18 DIAGNOSIS — Z9989 Dependence on other enabling machines and devices: Secondary | ICD-10-CM

## 2013-12-18 DIAGNOSIS — I509 Heart failure, unspecified: Secondary | ICD-10-CM

## 2013-12-18 DIAGNOSIS — I5031 Acute diastolic (congestive) heart failure: Secondary | ICD-10-CM

## 2013-12-18 MED ORDER — DEXLANSOPRAZOLE 60 MG PO CPDR: 60.0000 mg | DELAYED_RELEASE_CAPSULE | Freq: Every day | ORAL | Status: DC

## 2013-12-18 MED ORDER — LISINOPRIL 5 MG PO TABS: 5.0000 mg | ORAL_TABLET | Freq: Every day | ORAL | Status: DC

## 2013-12-18 MED ORDER — ALPRAZOLAM 0.25 MG PO TABS: 0.2500 mg | ORAL_TABLET | Freq: Every day | ORAL | Status: DC | PRN

## 2013-12-18 MED ORDER — HYDROCODONE-ACETAMINOPHEN 5-325 MG PO TABS: 1.0000 | ORAL_TABLET | Freq: Four times a day (QID) | ORAL | Status: DC | PRN

## 2013-12-18 NOTE — Progress Notes (Signed)
Pre-visit discussion using our clinic review tool. No additional management support is needed unless otherwise documented below in the visit note.  

## 2013-12-18 NOTE — Progress Notes (Signed)
Patient ID: Maria Torres, female   DOB: 09/17/1945, 68 y.o.   MRN: [id number]   Patient Active Problem List   Diagnosis Date Noted  . Anemia 10/08/2011    Priority: Low  . Syncope and collapse 11/08/2013  . Edema 11/08/2013  . Acute renal failure 10/28/2013  . Chest pain, unspecified 10/27/2013  . Depression, major, recurrent 09/28/2013  . Knee pain, bilateral 09/28/2013  . Routine general medical examination at a health care facility 07/26/2012  . Migraine 03/27/2012  . Obesity, Class III, BMI 40-49.9 (morbid obesity) 11/20/2011  . Post menopausal syndrome 11/20/2011  . Headache 10/08/2011  . Hypothyroidism 10/08/2011  . Aortic valve disorder 10/08/2011  . Benign hypertension 10/08/2011  . Mitral valve disorder 10/08/2011  . Hyperlipidemia 10/08/2011  . OSA on CPAP 10/08/2011  . Acute diastolic CHF (congestive heart failure) 10/08/2011    Subjective:  CC:   Chief Complaint  Patient presents with  . Follow-up    FMLA    HPI:   Maria Torres is a 68 y.o. female who presents for Follow up on multiple chronic issues including  Diastolic dysfunction .  acute renal failure, morbid obesity and depression . She has been taking 20 mg torsemide and 5 mg of lisinopril , per last visit with nephrologist. Still symptomatic with minimal exertion .   dexilant working wonders on reflux . All other PPIs have not provided relief of reflux esophagitis.  She has moved out , feel better about her life in general     Past Medical History  Diagnosis Date  . Morbid obesity   . Hx of glaucoma   . Thyroid disease     hypothyrodism  . GERD (gastroesophageal reflux disease)   . Hypertension   . Aortic stenosis     Due to bicuspid aortic valve. 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 at [facility]  . 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 Obstructive sleep apnea on CPAP   . Anemia   . Headache(784.0)   . Cervicalgia   . Mitral  valve disorder   . Hyperlipidemia   . CHF (congestive heart failure)     Past Surgical History  Procedure Laterality Date  . Cholecystectomy    . Bilateral arthroscopic knee surgery  2001 & 2002  . Gastric banding surgery      @ [place] 04-16-96  . Unilateral total knee replacement  08/2002  . Total left hip replacement  01-20-2007  . 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 Cardiac catheterization  2004    [facility]  . [facility]       The following portions of the patient's history were reviewed and updated as appropriate: Allergies, current medications, and problem list.    Review of Systems:   Patient denies headache, fevers, malaise, unintentional weight loss, skin rash, eye pain, sinus congestion and sinus pain, sore throat, dysphagia,  hemoptysis , cough, dyspnea, wheezing, chest pain, palpitations, orthopnea, edema, abdominal pain, nausea, melena, diarrhea, constipation, flank pain, dysuria, hematuria, urinary  Frequency, nocturia, numbness, tingling, seizures,  Focal weakness, Loss of consciousness,  Tremor, insomnia, depression, anxiety, and suicidal ideation.     History   Social History  . Marital Status: Single    Spouse Name: N/A    Number of Children: N/A  . Years of Education: N/A   Occupational History  . Not  on file.   Social History Main Topics  . Smoking status: Never Smoker   . Smokeless tobacco: Never Used  . Alcohol Use: Yes  . Drug Use: No  . Sexual Activity: Not on file   Other Topics Concern  . Not on file   Social History Narrative  . No narrative on file    Objective:  Filed Vitals:   12/18/13 1648  BP: 138/76  Pulse: 79  Temp: 98.3 F (36.8 C)  Resp: 16     General appearance: alert, cooperative and appears stated age Ears: normal TM's and external ear canals both ears Throat: lips, mucosa, and tongue normal; teeth and gums normal Neck: no adenopathy, no  carotid bruit, supple, symmetrical, trachea midline and thyroid not enlarged, symmetric, no tenderness/mass/nodules Back: symmetric, no curvature. ROM normal. No CVA tenderness. Lungs: clear to auscultation bilaterally Heart: regular rate and rhythm, S1, S2 normal, no murmur, click, rub or gallop Abdomen: soft, non-tender; bowel sounds normal; no masses,  no organomegaly Pulses: 2+ and symmetric Skin: Skin color, texture, turgor normal. No rashes or lesions Lymph nodes: Cervical, supraclavicular, and axillary nodes normal.  Assessment and Plan:  Acute diastolic CHF (congestive heart failure) Still symptomatic , with recent adjustments made in medications.   OSA on CPAP Diagnosed by sleep study. She is wearing her CPAP every night a minimum of 6 hours per night.   Hypothyroidism Thyroid function was underactive in April and dose was increased.  Repeat TSh is due next week.  Lab Results  Component Value Date   TSH 16.21* 11/06/2013    Updated Medication List Outpatient Encounter Prescriptions as of 12/18/2013  Medication Sig  . albuterol (PROVENTIL HFA;VENTOLIN HFA) 108 (90 BASE) MCG/ACT inhaler Inhale 3 puffs into the lungs 4 (four) times daily.  Marland Kitchen ALPRAZolam (XANAX) 0.25 MG tablet Take 1 tablet (0.25 mg total) by mouth daily as needed for sleep or anxiety.  . budesonide-formoterol (SYMBICORT) 160-4.5 MCG/ACT inhaler Inhale 2 puffs into the lungs 2 (two) times daily.  Marland Kitchen buPROPion (WELLBUTRIN XL) 300 MG 24 hr tablet take 1 tablet by mouth once daily  . carvedilol (COREG) 3.125 MG tablet Take 1 tablet by mouth 2 (two) times daily.  . citalopram (CELEXA) 40 MG tablet Take 1 tablet (40 mg total) by mouth daily.  Marland Kitchen HYDROcodone-acetaminophen (NORCO/VICODIN) 5-325 MG per tablet Take 1 tablet by mouth every 6 (six) hours as needed for moderate pain.  . hyoscyamine (LEVSIN SL) 0.125 MG SL tablet Place 1 tablet (0.125 mg total) under the tongue every 4 (four) hours as needed. For esophageal  spasm  . levothyroxine (SYNTHROID, LEVOTHROID) 150 MCG tablet Take 1 tablet (150 mcg total) by mouth daily.  Marland Kitchen lisinopril (PRINIVIL,ZESTRIL) 5 MG tablet Take 1 tablet (5 mg total) by mouth daily.  Marland Kitchen topiramate (TOPAMAX) 50 MG tablet take 1 tablet twice a day  . torsemide (DEMADEX) 20 MG tablet Take 20 mg by mouth daily.   . traMADol (ULTRAM) 50 MG tablet Take 50 mg by mouth every 6 (six) hours as needed.  . warfarin (COUMADIN) 10 MG tablet TAKE 1 TABLET BY MOUTH AS DIRECTED.  . [DISCONTINUED] ALPRAZolam (XANAX) 0.25 MG tablet Take 1 tablet (0.25 mg total) by mouth daily as needed for sleep or anxiety.  . [DISCONTINUED] HYDROcodone-acetaminophen (NORCO/VICODIN) 5-325 MG per tablet Take 1 tablet by mouth every 6 (six) hours as needed for moderate pain.  . [DISCONTINUED] lisinopril (PRINIVIL,ZESTRIL) 10 MG tablet Take 1 tablet (10 mg total) by mouth daily.  . [  DISCONTINUED] lisinopril (PRINIVIL,ZESTRIL) 10 MG tablet Take 5 mg by mouth daily.  Marland Kitchen amoxicillin (AMOXIL) 250 MG capsule Take 250 mg by mouth 3 (three) times daily. Before dental procedures  . dexlansoprazole (DEXILANT) 60 MG capsule Take 1 capsule (60 mg total) by mouth daily.  Marland Kitchen HYDROcodone-homatropine (HYCODAN) 5-1.5 MG/5ML syrup Take 5 mLs by mouth every 6 (six) hours as needed for cough.  . predniSONE (DELTASONE) 10 MG tablet 6 tablets daily for 3 days, then taper by 10 mg daily until gone  . promethazine (PHENERGAN) 12.5 MG tablet Take 1 tablet (12.5 mg total) by mouth every 8 (eight) hours as needed for nausea.  . [DISCONTINUED] dexlansoprazole (DEXILANT) 60 MG capsule Take 1 capsule (60 mg total) by mouth daily.  . [DISCONTINUED] eletriptan (RELPAX) 40 MG tablet One tablet by mouth at onset of headache. May repeat in 2 hours if headache persists or recurs. may repeat in 2 hours if necessary  . [DISCONTINUED] nystatin (MYCOSTATIN) powder Apply topically 4 (four) times daily.  . [DISCONTINUED] omeprazole (PRILOSEC) 20 MG capsule Take 40  mg by mouth daily. NEED TO KEEP APPT IN February FOR FURTHER REFILLS  . [DISCONTINUED] potassium chloride (KLOR-CON M10) 10 MEQ tablet take 2 tablets by mouth once daily when taking furosemide  . [DISCONTINUED] zolmitriptan (ZOMIG) 5 MG nasal solution Place 1 spray into the nose as needed for migraine.  . [DISCONTINUED] zoster vaccine live, PF, (ZOSTAVAX) 29562 UNT/0.65ML injection Inject 19,400 Units into the skin once.     Orders Placed This Encounter  Procedures  . TSH  . Comprehensive metabolic panel  . CBC with Differential    No Follow-up on file.

## 2013-12-20 NOTE — Assessment & Plan Note (Signed)
Thyroid function was underactive in April and dose was increased.  Repeat TSh is due next week.  Lab Results  Component Value Date   TSH 16.21* 11/06/2013

## 2013-12-20 NOTE — Assessment & Plan Note (Signed)
Diagnosed by sleep study. She is wearing her CPAP every night a minimum of 6 hours per night.  

## 2013-12-20 NOTE — Assessment & Plan Note (Signed)
Still symptomatic , with recent adjustments made in medications.

## 2013-12-23 ENCOUNTER — Encounter: Payer: Self-pay | Admitting: Internal Medicine

## 2014-01-12 DIAGNOSIS — D6869 Other thrombophilia: Secondary | ICD-10-CM | POA: Insufficient documentation

## 2014-01-26 ENCOUNTER — Encounter: Payer: Self-pay | Admitting: Internal Medicine

## 2014-02-02 ENCOUNTER — Encounter: Payer: Self-pay | Admitting: Internal Medicine

## 2014-02-02 ENCOUNTER — Ambulatory Visit (INDEPENDENT_AMBULATORY_CARE_PROVIDER_SITE_OTHER): Payer: BC Managed Care – PPO | Admitting: Internal Medicine

## 2014-02-02 DIAGNOSIS — M25569 Pain in unspecified knee: Secondary | ICD-10-CM

## 2014-02-02 DIAGNOSIS — M25562 Pain in left knee: Secondary | ICD-10-CM

## 2014-02-02 DIAGNOSIS — I5031 Acute diastolic (congestive) heart failure: Secondary | ICD-10-CM

## 2014-02-02 DIAGNOSIS — M25561 Pain in right knee: Secondary | ICD-10-CM

## 2014-02-02 DIAGNOSIS — G4733 Obstructive sleep apnea (adult) (pediatric): Secondary | ICD-10-CM

## 2014-02-02 DIAGNOSIS — E66813 Obesity, class 3: Secondary | ICD-10-CM

## 2014-02-02 DIAGNOSIS — I509 Heart failure, unspecified: Secondary | ICD-10-CM

## 2014-02-02 DIAGNOSIS — N179 Acute kidney failure, unspecified: Secondary | ICD-10-CM

## 2014-02-02 DIAGNOSIS — Z9989 Dependence on other enabling machines and devices: Secondary | ICD-10-CM

## 2014-02-02 NOTE — Progress Notes (Signed)
Patient ID: Maria Torres, female   DOB: 1945/07/26, 68 y.o.   MRN: [id number]   Patient Active Problem List   Diagnosis Date Noted  . Anemia 10/08/2011    Priority: Low  . Syncope and collapse 11/08/2013  . Edema 11/08/2013  . Acute renal failure 10/28/2013  . Chest pain, unspecified 10/27/2013  . Depression, major, recurrent 09/28/2013  . Knee pain, bilateral 09/28/2013  . Routine general medical examination at a health care facility 07/26/2012  . Migraine 03/27/2012  . Obesity, Class III, BMI 40-49.9 (morbid obesity) 11/20/2011  . Post menopausal syndrome 11/20/2011  . Headache 10/08/2011  . Hypothyroidism 10/08/2011  . Aortic valve disorder 10/08/2011  . Benign hypertension 10/08/2011  . Mitral valve disorder 10/08/2011  . Hyperlipidemia 10/08/2011  . OSA on CPAP 10/08/2011  . Acute diastolic CHF (congestive heart failure) 10/08/2011    Subjective:  CC:   Chief Complaint  Patient presents with  . Follow-up    3 month folow up    HPI:   Maria Torres is a 68 y.o. 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 Recurrent acute on chronic diastolic dysfunction , chronic kidney disease with fluid retention, and mahor depressive disorder.    She has been having a difficult time.  Has been unable to return to work as anticipated due to decreased mobility secondary to joint pain and fluid retention.  She is short of breath with minimal exertion and has 4 pillow orthopnea.  Has been having increased back/kidney pain on the right.  Was diagnosed with UTI  3 or 4 weeks ago , which was recurrent, accompanied by a  fever a week ago of 101.    SAW  nephrology, treated with amoxicillin , did not resolve,  HAD A SECOND  Round of amoxicillin (same abx ) by PA.  Her back remains exquisitely painful to even to light touch for the past 3 weeks.   Has gained 6 lbs since  last visit.,  only taking torsemide 20 mg daily per nephrology because higher doses have caused acute renal failure.   Has modified her diet.  Drinking cider vinegar bc she read about the health benefits to kidneys.  Having difficulty losing weight due to decreased mobility and slow metabolism  .  She is considering bariatric surgery  She has only worked two days since returning to work and her employer  wants her to stay out until August    Past Medical History  Diagnosis Date  . Morbid obesity   . Hx of glaucoma   . Thyroid disease     hypothyrodism  . GERD (gastroesophageal reflux disease)   . Hypertension   . Aortic stenosis     Due to bicuspid aortic valve. 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 at [facility]  . 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 Obstructive sleep apnea on CPAP   . Anemia   . Headache(784.0)   . Cervicalgia   . Mitral valve disorder   . Hyperlipidemia   . CHF (congestive heart failure)     Past Surgical History  Procedure Laterality Date  . Cholecystectomy    . Bilateral arthroscopic knee surgery  2001 & 2002  . Gastric banding surgery      @ [place] 04-16-96  . Unilateral total knee replacement  08/2002  . Total left hip replacement  01-20-2007  . 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 Cardiac catheterization  2004    [facility]  . Cardiac catheterization  2008    [facility]       The following portions of the patient's history were reviewed and updated as appropriate: Allergies, current medications, and problem list.    Review of Systems:   Patient denies headache, fevers, malaise, unintentional weight loss, skin rash, eye pain, sinus congestion and sinus pain, sore throat, dysphagia,  hemoptysis , cough, dyspnea, wheezing, chest pain, palpitations, orthopnea, edema, abdominal pain, nausea, melena, diarrhea, constipation, flank pain, dysuria, hematuria, urinary   Frequency, nocturia, numbness, tingling, seizures,  Focal weakness, Loss of consciousness,  Tremor, insomnia, depression, anxiety, and suicidal ideation.     History   Social History  . Marital Status: Single    Spouse Name: N/A    Number of Children: N/A  . Years of Education: N/A   Occupational History  . Not on file.   Social History Main Topics  . Smoking status: Never Smoker   . Smokeless tobacco: Never Used  . Alcohol Use: Yes  . Drug Use: No  . Sexual Activity: Not on file   Other Topics Concern  . Not on file   Social History Narrative  . No narrative on file    Objective:  Filed Vitals:   02/02/14 1605  BP: 134/60  Pulse: 56  Temp: 98.4 F (36.9 C)  Resp: 20     General appearance: alert, cooperative and appears stated age Ears: normal TM's and external ear canals both ears Throat: lips, mucosa, and tongue normal; teeth and gums normal Neck: no adenopathy, no carotid bruit, supple, symmetrical, trachea midline and thyroid not enlarged, symmetric, no tenderness/mass/nodules Back: symmetric, no curvature. ROM normal. No CVA tenderness. Lungs: clear to auscultation bilaterally Heart: regular rate and rhythm, S1, S2 normal, no murmur, click, rub or gallop Abdomen: soft, non-tender; bowel sounds normal; no masses,  no organomegaly Pulses: 2+ and symmetric Skin: Skin color, texture, turgor normal. No rashes or lesions Lymph nodes: Cervical, supraclavicular, and axillary nodes normal.  Assessment and Plan:  OSA on CPAP Diagnosed by sleep study. She is wearing her CPAP every night a minimum of 6 hours per night.   Acute diastolic CHF (congestive heart failure) She remains symptomatic with mild exertion and is unable to diurese without losing GFR. BPis controlled.  No changes to meds today   Acute renal failure Now with stage III CKD managed by nephrology.  Continue torsemide, consider acting xaroxoyln  Obesity, Class III, BMI 40-49.9 (morbid  obesity) Discussion of the risk and benefits of bariatric surgery and recommended  Bariatric  Surgery.  Referral to [facility]   Knee pain, bilateral With prior knee replacements Pain is aggravated by her B<I of 50.  Recommended pool exercises. A total of 40 minutes was spent with patient more than half of which was spent in counseling, reviewing records from other prviders and coordination of care.   Updated Medication List Outpatient Encounter Prescriptions as of 02/02/2014  Medication Sig  . albuterol (PROVENTIL HFA;VENTOLIN HFA) 108 (90 BASE) MCG/ACT inhaler Inhale 3 puffs into the lungs 4 (four) times daily.  Marland Kitchen ALPRAZolam (XANAX) 0.25 MG tablet Take 1 tablet (0.25 mg total) by mouth daily as needed for sleep or anxiety.  Marland Kitchen amoxicillin (AMOXIL) 250 MG capsule Take 250 mg by mouth 3 (three) times daily. Before dental procedures  .  budesonide-formoterol (SYMBICORT) 160-4.5 MCG/ACT inhaler Inhale 2 puffs into the lungs 2 (two) times daily.  Marland Kitchen. buPROPion (WELLBUTRIN XL) 300 MG 24 hr tablet take 1 tablet by mouth once daily  . carvedilol (COREG) 3.125 MG tablet Take 1 tablet by mouth 2 (two) times daily.  . citalopram (CELEXA) 40 MG tablet Take 1 tablet (40 mg total) by mouth daily.  Marland Kitchen. dexlansoprazole (DEXILANT) 60 MG capsule Take 1 capsule (60 mg total) by mouth daily.  Marland Kitchen. HYDROcodone-acetaminophen (NORCO/VICODIN) 5-325 MG per tablet Take 1 tablet by mouth every 6 (six) hours as needed for moderate pain.  Marland Kitchen. HYDROcodone-homatropine (HYCODAN) 5-1.5 MG/5ML syrup Take 5 mLs by mouth every 6 (six) hours as needed for cough.  . hyoscyamine (LEVSIN SL) 0.125 MG SL tablet Place 1 tablet (0.125 mg total) under the tongue every 4 (four) hours as needed. For esophageal spasm  . levothyroxine (SYNTHROID, LEVOTHROID) 150 MCG tablet Take 1 tablet (150 mcg total) by mouth daily.  Marland Kitchen. lisinopril (PRINIVIL,ZESTRIL) 5 MG tablet Take 1 tablet (5 mg total) by mouth daily.  . predniSONE (DELTASONE) 10 MG tablet 6  tablets daily for 3 days, then taper by 10 mg daily until gone  . topiramate (TOPAMAX) 50 MG tablet take 1 tablet twice a day  . torsemide (DEMADEX) 20 MG tablet Take 20 mg by mouth daily.   . traMADol (ULTRAM) 50 MG tablet Take 50 mg by mouth every 6 (six) hours as needed.  . warfarin (COUMADIN) 10 MG tablet TAKE 1 TABLET BY MOUTH AS DIRECTED.  Marland Kitchen. promethazine (PHENERGAN) 12.5 MG tablet Take 1 tablet (12.5 mg total) by mouth every 8 (eight) hours as needed for nausea.     Orders Placed This Encounter  Procedures  . Ambulatory referral to General Surgery    No Follow-up on file.

## 2014-02-02 NOTE — Progress Notes (Signed)
Pre-visit discussion using our clinic review tool. No additional management support is needed unless otherwise documented below in the visit note.  

## 2014-02-02 NOTE — Assessment & Plan Note (Signed)
Diagnosed by sleep study. She is wearing her CPAP every night a minimum of 6 hours per night.  

## 2014-02-03 ENCOUNTER — Encounter: Payer: Self-pay | Admitting: Internal Medicine

## 2014-02-03 NOTE — Assessment & Plan Note (Signed)
With prior knee replacements Pain is aggravated by her B<I of 50.  Recommended pool exercises.

## 2014-02-03 NOTE — Assessment & Plan Note (Signed)
Discussion of the risk and benefits of bariatric surgery and recommended  Bariatric  Surgery.  Referral to [facility]

## 2014-02-03 NOTE — Assessment & Plan Note (Signed)
Now with stage III CKD managed by nephrology.  Continue torsemide, consider acting xaroxoyln

## 2014-02-03 NOTE — Assessment & Plan Note (Addendum)
She remains symptomatic with mild exertion and is unable to diurese without losing GFR. BPis controlled.  No changes to meds today

## 2014-02-07 DIAGNOSIS — Z0279 Encounter for issue of other medical certificate: Secondary | ICD-10-CM

## 2014-03-03 ENCOUNTER — Telehealth: Payer: Self-pay | Admitting: Internal Medicine

## 2014-03-03 MED ORDER — HYDROCODONE-ACETAMINOPHEN 5-325 MG PO TABS: 1.0000 | ORAL_TABLET | Freq: Four times a day (QID) | ORAL | Status: DC | PRN

## 2014-03-03 MED ORDER — LEVOTHYROXINE SODIUM 150 MCG PO TABS: 150.0000 ug | ORAL_TABLET | Freq: Every day | ORAL | Status: DC

## 2014-03-03 NOTE — Telephone Encounter (Signed)
Her thyroid medication was increased in April and hshe never had the repeat TSH done to confirm correction of underactive thyroid,  I will refill synthroif for 30 days to give her time to get the lab done here [facility] to refill,  printed rx for vicodin

## 2014-03-03 NOTE — Telephone Encounter (Signed)
Last fill 12/18/13 ok to fill?

## 2014-03-03 NOTE — Telephone Encounter (Signed)
Patient has an appointment on 03/19/14 will have TSH checked then , patient notified script ready and placed at front desk.

## 2014-03-03 NOTE — Telephone Encounter (Signed)
HYDROcodone-acetaminophen (NORCO/VICODIN) 5-325 MG per tablet  levothyroxine (SYNTHROID, LEVOTHROID) 150 MCG tablet

## 2014-03-05 ENCOUNTER — Ambulatory Visit: Payer: BC Managed Care – PPO | Admitting: Internal Medicine

## 2014-03-19 ENCOUNTER — Ambulatory Visit (INDEPENDENT_AMBULATORY_CARE_PROVIDER_SITE_OTHER): Payer: BC Managed Care – PPO | Admitting: Internal Medicine

## 2014-03-19 ENCOUNTER — Encounter: Payer: Self-pay | Admitting: Internal Medicine

## 2014-03-19 VITALS — BP 118/76 | HR 61 | Temp 97.6°F | Resp 16 | Ht 68.0 in | Wt 319.0 lb

## 2014-03-19 DIAGNOSIS — Z23 Encounter for immunization: Secondary | ICD-10-CM

## 2014-03-19 DIAGNOSIS — E039 Hypothyroidism, unspecified: Secondary | ICD-10-CM

## 2014-03-19 DIAGNOSIS — I5031 Acute diastolic (congestive) heart failure: Secondary | ICD-10-CM

## 2014-03-19 LAB — CBC WITH DIFFERENTIAL/PLATELET
BASOS PCT: 0.7 % (ref 0.0–3.0)
Basophils Absolute: 0 10*3/uL (ref 0.0–0.1)
EOS ABS: 0.2 10*3/uL (ref 0.0–0.7)
Eosinophils Relative: 3.3 % (ref 0.0–5.0)
HCT: 38.2 % (ref 36.0–46.0)
HEMOGLOBIN: 12.8 g/dL (ref 12.0–15.0)
Lymphocytes Relative: 18.9 % (ref 12.0–46.0)
Lymphs Abs: 1.3 10*3/uL (ref 0.7–4.0)
MCHC: 33.6 g/dL (ref 30.0–36.0)
MCV: 87.2 fl (ref 78.0–100.0)
MONO ABS: 0.5 10*3/uL (ref 0.1–1.0)
Monocytes Relative: 6.8 % (ref 3.0–12.0)
Neutro Abs: 4.7 10*3/uL (ref 1.4–7.7)
Neutrophils Relative %: 70.3 % (ref 43.0–77.0)
Platelets: 244 10*3/uL (ref 150.0–400.0)
RBC: 4.38 Mil/uL (ref 3.87–5.11)
RDW: 15.2 % (ref 11.5–15.5)
WBC: 6.6 10*3/uL (ref 4.0–10.5)

## 2014-03-19 LAB — COMPREHENSIVE METABOLIC PANEL
ALK PHOS: 53 U/L (ref 39–117)
ALT: 18 U/L (ref 0–35)
AST: 17 U/L (ref 0–37)
Albumin: 4.1 g/dL (ref 3.5–5.2)
BUN: 23 mg/dL (ref 6–23)
CO2: 25 mEq/L (ref 19–32)
CREATININE: 1.3 mg/dL — AB (ref 0.4–1.2)
Calcium: 9.1 mg/dL (ref 8.4–10.5)
Chloride: 105 mEq/L (ref 96–112)
GFR: 43.08 mL/min — ABNORMAL LOW (ref >60.00)
Glucose, Bld: 76 mg/dL (ref 70–99)
Potassium: 4.9 mEq/L (ref 3.5–5.1)
Sodium: 136 mEq/L (ref 135–145)
Total Bilirubin: 0.7 mg/dL (ref 0.2–1.2)
Total Protein: 7.3 g/dL (ref 6.0–8.3)

## 2014-03-19 LAB — TSH: TSH: 61.58 u[IU]/mL — ABNORMAL HIGH (ref 0.35–4.50)

## 2014-03-19 MED ORDER — ZOSTER VACCINE LIVE 19400 UNT/0.65ML ~~LOC~~ SOLR: 0.6500 mL | Freq: Once | SUBCUTANEOUS | Status: DC

## 2014-03-19 MED ORDER — HYDROCORTISONE 2.5 % EX CREA: TOPICAL_CREAM | Freq: Two times a day (BID) | CUTANEOUS | Status: DC

## 2014-03-19 NOTE — Progress Notes (Signed)
Pre-visit discussion using our clinic review tool. No additional management support is needed unless otherwise documented below in the visit note.  

## 2014-03-19 NOTE — Progress Notes (Signed)
Patient ID: Maria Torres, female   DOB: 06/09/1946, 68 y.o.   MRN: [id number]   1 month follow up on multiple issues  Including hypothyroidism, morbid obesity.  Diastolic dysfunction with chronic edema, acute renal failure and depression.    She is curnelty feeling beter than she has in years.  She has moved in to her new home and thriving.  Has met with D r westman at [facility] and following the "no sugar, no starch" diet .  Eating bacon eggs and cheese in the Am. Used cauliflower recipe for pizza crust and mashed potatoes .  Loves the diet. Has lost 12 lbs    Had diarrhea a week ago that lasted from Friday to Monday, now resolved,   INR was 2.4 yesterday

## 2014-03-19 NOTE — Patient Instructions (Signed)
You look fantastic!  I am so thrilled at the change !  I'll e mail you the results of your labs (thyroid especially)  You are due for the tetanus (Tdap) vaccine and received it today  The shingles vaccine is less $$$$ at a local pharmacy

## 2014-03-20 ENCOUNTER — Encounter: Payer: Self-pay | Admitting: Internal Medicine

## 2014-03-20 DIAGNOSIS — E039 Hypothyroidism, unspecified: Secondary | ICD-10-CM

## 2014-04-23 ENCOUNTER — Ambulatory Visit: Payer: BC Managed Care – PPO | Admitting: Internal Medicine

## 2014-04-29 ENCOUNTER — Other Ambulatory Visit: Payer: Self-pay | Admitting: Internal Medicine

## 2014-05-17 ENCOUNTER — Ambulatory Visit: Payer: BC Managed Care – PPO | Admitting: Internal Medicine

## 2014-05-21 ENCOUNTER — Other Ambulatory Visit: Payer: Self-pay | Admitting: Internal Medicine

## 2014-05-21 NOTE — Telephone Encounter (Signed)
Ok to refill,  Refill sent  

## 2014-05-21 NOTE — Telephone Encounter (Signed)
Pt has had 3 cancellations since 7.14.15.  Last OV 8.28.15, last refill 9.21.15.  Please advise refill

## 2014-05-31 ENCOUNTER — Ambulatory Visit: Payer: BC Managed Care – PPO | Admitting: Internal Medicine

## 2014-06-08 ENCOUNTER — Other Ambulatory Visit: Payer: Self-pay | Admitting: Internal Medicine

## 2014-06-16 ENCOUNTER — Telehealth: Payer: Self-pay | Admitting: Internal Medicine

## 2014-06-16 MED ORDER — BENZONATATE 200 MG PO CAPS: 200.0000 mg | ORAL_CAPSULE | Freq: Three times a day (TID) | ORAL | Status: DC | PRN

## 2014-06-16 MED ORDER — LEVOFLOXACIN 500 MG PO TABS: 500.0000 mg | ORAL_TABLET | Freq: Every day | ORAL | Status: DC

## 2014-06-16 MED ORDER — PREDNISONE 10 MG PO TABS: ORAL_TABLET | ORAL | Status: DC

## 2014-06-16 NOTE — Telephone Encounter (Signed)
I will call in an antibiotic,  A   Prednisone taper and make sure she has her albuterol MDI.  Take otc sudafed PE or Afrin nasal spray  every 12 hors for the congestion.  Get coumadin level checked next week on Monday to make sure it is not too high . Use tessalon perles for the cough (rx sent)

## 2014-06-16 NOTE — Telephone Encounter (Signed)
Patient Information:  Caller Name: Aram BeechamCynthia  Phone: [phone]  Patient: Pilar JarvisReardon, Baillie M  Gender: Female  DOB: 09/24/1945  Age: 68 Years  PCP: Duncan Dullullo, Teresa (Adults only)  Office Follow Up:  Does the office need to follow up with this patient?: Yes  Instructions For The Office: Disposition is : See within 4 hours/office now.    No available appt, [place] in [place] so another [place] location will not work.  Pt wants to know if Dr Darrick Huntsmanullo has any further recommendations of what she should be doing.    Please follow up wtih any recommendations or if pt needs to go to UC.   Symptoms  Reason For Call & Symptoms: 06/09/14 cough/congestion.    06/16/14 sxs are worsening.   Pt has been taking Mucinex DM which helps some, recently switched to liquid instead of the tabs and seems to be helping more.   Afebrile.   C/o head hurting.  Discharge is thick green.  Pt does note that she does NOT have a lot of fluid on her/swelling (history of CHF).  Pt states she does hear some wheezing/rattling especially when coughing.   Advised pt of need for appt, no appts available and [place] in [place] so going to another [place] office will not work.  Pt would like to know from Dr Darrick Huntsmanullo if there is anything else she should be doing.  Reviewed Health History In EMR: Yes  Reviewed Medications In EMR: Yes  Reviewed Allergies In EMR: Yes  Reviewed Surgeries / Procedures: Yes  Date of Onset of Symptoms: 06/09/2014  Treatments Tried: Mucinex DM  Treatments Tried Worked: No  Guideline(s) Used:  Cough  Disposition Per Guideline:   Go to Office Now  Reason For Disposition Reached:   Wheezing is present  Advice Given:  Cough Medicines:  OTC Cough Syrups: The most common cough suppressant in OTC cough medications is dextromethorphan. Often the letters "DM" appear in the name.  OTC Cough Drops: Cough drops can help a lot, especially for mild coughs. They reduce coughing by soothing your irritated throat and removing  that tickle sensation in the back of the throat. Cough drops also have the advantage of portability - you can carry them with you.  Home Remedy - Hard Candy: Hard candy works just as well as medicine-flavored OTC cough drops. Diabetics should use sugar-free candy.  Home Remedy - Honey: This old home remedy has been shown to help decrease coughing at night. The adult dosage is 2 teaspoons (10 ml) at bedtime. Honey should not be given to infants under one year of age.  Patient Will Follow Care Advice:  YES

## 2014-06-16 NOTE — Telephone Encounter (Signed)
Patient notified and voiced understanding, read back directions including to take probiotic.

## 2014-06-22 ENCOUNTER — Encounter: Payer: Self-pay | Admitting: Internal Medicine

## 2014-06-22 ENCOUNTER — Inpatient Hospital Stay: Payer: Self-pay | Admitting: Internal Medicine

## 2014-06-22 ENCOUNTER — Ambulatory Visit (INDEPENDENT_AMBULATORY_CARE_PROVIDER_SITE_OTHER): Payer: BC Managed Care – PPO | Admitting: Internal Medicine

## 2014-06-22 VITALS — BP 118/70 | HR 65 | Temp 98.2°F | Wt 290.5 lb

## 2014-06-22 DIAGNOSIS — J069 Acute upper respiratory infection, unspecified: Secondary | ICD-10-CM

## 2014-06-22 DIAGNOSIS — R062 Wheezing: Secondary | ICD-10-CM

## 2014-06-22 LAB — COMPREHENSIVE METABOLIC PANEL
ALBUMIN: 3.8 g/dL (ref 3.4–5.0)
ALK PHOS: 68 U/L
ALT: 31 U/L
ANION GAP: 7 (ref 7–16)
AST: 27 U/L (ref 15–37)
BUN: 27 mg/dL — AB (ref 7–18)
Bilirubin,Total: 0.5 mg/dL (ref 0.2–1.0)
CALCIUM: 8.9 mg/dL (ref 8.5–10.1)
CREATININE: 1.59 mg/dL — AB (ref 0.60–1.30)
Chloride: 103 mmol/L (ref 98–107)
Co2: 29 mmol/L (ref 21–32)
EGFR (Non-African Amer.): 35 — ABNORMAL LOW
GFR CALC AF AMER: 42 — AB
GLUCOSE: 103 mg/dL — AB (ref 65–99)
Osmolality: 283 (ref 275–301)
POTASSIUM: 4.2 mmol/L (ref 3.5–5.1)
SODIUM: 139 mmol/L (ref 136–145)
Total Protein: 8.1 g/dL (ref 6.4–8.2)

## 2014-06-22 LAB — CK TOTAL AND CKMB (NOT AT ARMC)
CK, Total: 103 U/L (ref 26–192)
CK-MB: 1.6 ng/mL (ref 0.5–3.6)

## 2014-06-22 LAB — CBC
HCT: 43 % (ref 35.0–47.0)
HGB: 14 g/dL (ref 12.0–16.0)
MCH: 29.2 pg (ref 26.0–34.0)
MCHC: 32.5 g/dL (ref 32.0–36.0)
MCV: 90 fL (ref 80–100)
PLATELETS: 284 10*3/uL (ref 150–440)
RBC: 4.78 10*6/uL (ref 3.80–5.20)
RDW: 14.3 % (ref 11.5–14.5)
WBC: 13.5 10*3/uL — ABNORMAL HIGH (ref 3.6–11.0)

## 2014-06-22 LAB — PROTIME-INR
INR: 2.4
PROTHROMBIN TIME: 25.4 s — AB (ref 11.5–14.7)

## 2014-06-22 LAB — TROPONIN I: Troponin-I: <0.02 ng/mL

## 2014-06-22 MED ORDER — ALBUTEROL SULFATE (2.5 MG/3ML) 0.083% IN NEBU
2.5000 mg | INHALATION_SOLUTION | Freq: Once | RESPIRATORY_TRACT | Status: AC
  Administered 2014-06-22: 2.5 mg via RESPIRATORY_TRACT

## 2014-06-23 ENCOUNTER — Encounter: Payer: Self-pay | Admitting: Internal Medicine

## 2014-06-23 DIAGNOSIS — J9600 Acute respiratory failure, unspecified whether with hypoxia or hypercapnia: Secondary | ICD-10-CM | POA: Insufficient documentation

## 2014-06-23 LAB — CBC WITH DIFFERENTIAL/PLATELET
BASOS ABS: 0 10*3/uL (ref 0.0–0.1)
BASOS PCT: 0 %
Eosinophil #: 0 10*3/uL (ref 0.0–0.7)
Eosinophil %: 0 %
HCT: 39.8 % (ref 35.0–47.0)
HGB: 13 g/dL (ref 12.0–16.0)
LYMPHS PCT: 5.1 %
Lymphocyte #: 0.6 10*3/uL — ABNORMAL LOW (ref 1.0–3.6)
MCH: 30.1 pg (ref 26.0–34.0)
MCHC: 32.6 g/dL (ref 32.0–36.0)
MCV: 93 fL (ref 80–100)
Monocyte #: 0.1 x10 3/mm — ABNORMAL LOW (ref 0.2–0.9)
Monocyte %: 1 %
NEUTROS ABS: 11.1 10*3/uL — AB (ref 1.4–6.5)
NEUTROS PCT: 93.9 %
Platelet: 236 10*3/uL (ref 150–440)
RBC: 4.3 10*6/uL (ref 3.80–5.20)
RDW: 14.5 % (ref 11.5–14.5)
WBC: 11.8 10*3/uL — AB (ref 3.6–11.0)

## 2014-06-23 LAB — PROTIME-INR
INR: 2.7
PROTHROMBIN TIME: 27.9 s — AB (ref 11.5–14.7)

## 2014-06-23 LAB — BASIC METABOLIC PANEL
Anion Gap: 7 (ref 7–16)
BUN: 23 mg/dL — ABNORMAL HIGH (ref 7–18)
CALCIUM: 8.8 mg/dL (ref 8.5–10.1)
CO2: 27 mmol/L (ref 21–32)
Chloride: 102 mmol/L (ref 98–107)
Creatinine: 1.31 mg/dL — ABNORMAL HIGH (ref 0.60–1.30)
EGFR (African American): 52 — ABNORMAL LOW
EGFR (Non-African Amer.): 43 — ABNORMAL LOW
GLUCOSE: 159 mg/dL — AB (ref 65–99)
Osmolality: 279 (ref 275–301)
POTASSIUM: 4.3 mmol/L (ref 3.5–5.1)
Sodium: 136 mmol/L (ref 136–145)

## 2014-06-23 NOTE — Progress Notes (Signed)
Subjective:    Patient ID: Maria Torres, female    DOB: 1946-06-04, 68 y.o.   MRN: [id number]  HPI 68 year old female with past history of CHF, hypertension and aortic/mitral valve disorder.  She comes in today as a work in with concerns regarding persistent cough, congestion and wheezing despite abx, prednisone and regular inhaler use.  States symptoms started 10 days ago.  Worsened the day before Thanksgiving.  Previous fever with Tmax 101.  Was given abx, prednisone taper and has been using symbicort regularly.  Reports no improvement in her symptoms.  Still with increased cough, congestion, wheezing and difficulty talking (due to increased cough and sob).  Increased fatigue.  She is eating.  Decreased appetite.  No vomiting.  Minimal loose stool.  Taking a probiotic.  Has a h/o CHF.  No increased swelling.  Has adjusted her diet.     Past Medical History  Diagnosis Date  . Morbid obesity   . Hx of glaucoma   . Thyroid disease     hypothyrodism  . GERD (gastroesophageal reflux disease)   . Hypertension   . Aortic stenosis     Due to bicuspid aortic valve. 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 at [facility]  . 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 Obstructive sleep apnea on CPAP   . Anemia   . Headache(784.0)   . Cervicalgia   . Mitral valve disorder   . Hyperlipidemia   . CHF (congestive heart failure)     Current Outpatient Prescriptions on File Prior to Visit  Medication Sig Dispense Refill  . albuterol (PROVENTIL HFA;VENTOLIN HFA) 108 (90 BASE) MCG/ACT inhaler Inhale 3 puffs into the lungs 4 (four) times daily.    Marland Kitchen. ALPRAZolam (XANAX) 0.25 MG tablet Take 1 tablet (0.25 mg total) by mouth daily as needed for sleep or anxiety. 30 tablet 3  . benzonatate (TESSALON) 200 MG capsule Take 1 capsule (200 mg total) by mouth 3 (three) times daily as needed for cough. 60 capsule 1  . budesonide-formoterol (SYMBICORT) 160-4.5  MCG/ACT inhaler Inhale 2 puffs into the lungs 2 (two) times daily. 1 Inhaler 12  . buPROPion (WELLBUTRIN XL) 300 MG 24 hr tablet take 1 tablet by mouth once daily 90 tablet 3  . carvedilol (COREG) 3.125 MG tablet take 1 tablet by mouth every 12 hours 60 tablet 5  . citalopram (CELEXA) 40 MG tablet take 1 tablet by mouth once daily 30 tablet 2  . dexlansoprazole (DEXILANT) 60 MG capsule Take 1 capsule (60 mg total) by mouth daily. 30 capsule 2  . hydrocortisone 2.5 % cream Apply topically 2 (two) times daily. 30 g 0  . hyoscyamine (LEVSIN SL) 0.125 MG SL tablet Place 1 tablet (0.125 mg total) under the tongue every 4 (four) hours as needed. For esophageal spasm 30 tablet 2  . levothyroxine (SYNTHROID, LEVOTHROID) 150 MCG tablet Take 1 tablet (150 mcg total) by mouth daily. 30 tablet 2  . lisinopril (PRINIVIL,ZESTRIL) 5 MG tablet Take 1 tablet (5 mg total) by mouth daily. 90 tablet 1  . predniSONE (DELTASONE) 10 MG tablet 6 tablets daily for 3 days, then taper by 10 mg daily until gone 33 tablet 0  . topiramate (TOPAMAX) 50 MG tablet take 1 tablet twice a day 60 tablet 3  . torsemide (DEMADEX) 20 MG tablet Take 20 mg by mouth daily.     . traMADol (ULTRAM) 50  MG tablet Take 50 mg by mouth every 6 (six) hours as needed.    . warfarin (COUMADIN) 10 MG tablet TAKE 1 TABLET BY MOUTH AS DIRECTED 60 tablet 5  . zoster vaccine live, PF, (ZOSTAVAX) [id number] UNT/0.65ML injection Inject 19,400 Units into the skin once. 1 each 0  . amoxicillin (AMOXIL) 250 MG capsule Take 250 mg by mouth 3 (three) times daily. Before dental procedures    . promethazine (PHENERGAN) 12.5 MG tablet Take 1 tablet (12.5 mg total) by mouth every 8 (eight) hours as needed for nausea. 20 tablet 0   No current facility-administered medications on file prior to visit.    Review of Systems Patient denies any headache, lightheadedness or dizziness.  No sinus pressure.   No chest pain or palpitations.  Does report persistent increased  cough, congestion and sob.  Unable to talk without increased cough and sob.    No vomiting.  Decreased appetite.  No abdominal pain or cramping.  Minimal loose stool.         Objective:   Physical Exam Filed Vitals:   06/22/14 1305  BP: 118/70  Pulse: 65  Temp: 98.2 F (2836.888 C)   68 year old female who appears not to feel well.  HEENT:  Nares- slightly erythematous turbinates.   Oropharynx - without lesions. NECK:  Supple.  Nontender.    HEART:  Appears to be regular. LUNGS:  Increased congestion and wheezing.  Increased cough and wheezing with expiration.  Increased cough with talking.          Pulse ox 92% prior to neb.  After albuterol neb, pt still with increased wheezing and increased cough.  Pulse ox with deep breathing ranged 89-93%.       Assessment & Plan:  1. Wheezing Persistent.  Difficulty talking.   - albuterol (PROVENTIL) (2.5 MG/3ML) 0.083% nebulizer solution 2.5 mg; Take 3 mLs (2.5 mg total) by nebulization once. Post neb exam as outlined.  Given persistent symptoms despite abx, prednisone and inhalers, will refer to ER for further evaluation.  See below.    2. URI (upper respiratory infection) Persistent/worsening symptoms despite abx, prednisone and inhalers.  Was given an albuterol neb here in the office.  Still with increased cough, congestion and wheezing.  Failing outpt therapy.  Discussed with pt.  Refer to ER for evaluation.  Pt in agreement.  ER notified.

## 2014-06-24 LAB — PROTIME-INR
INR: 3.5
Prothrombin Time: 33.7 secs — ABNORMAL HIGH (ref 11.5–14.7)

## 2014-06-25 LAB — PROTIME-INR
INR: 3
PROTHROMBIN TIME: 29.9 s — AB (ref 11.5–14.7)

## 2014-06-26 LAB — PROTIME-INR
INR: 2.5
PROTHROMBIN TIME: 26.1 s — AB (ref 11.5–14.7)

## 2014-06-27 LAB — CBC WITH DIFFERENTIAL/PLATELET
BASOS PCT: 0.1 %
Basophil #: 0 10*3/uL (ref 0.0–0.1)
Eosinophil #: 0 10*3/uL (ref 0.0–0.7)
Eosinophil %: 0.1 %
HCT: 38.3 % (ref 35.0–47.0)
HGB: 12.5 g/dL (ref 12.0–16.0)
Lymphocyte #: 0.7 10*3/uL — ABNORMAL LOW (ref 1.0–3.6)
Lymphocyte %: 4.2 %
MCH: 29.8 pg (ref 26.0–34.0)
MCHC: 32.6 g/dL (ref 32.0–36.0)
MCV: 91 fL (ref 80–100)
MONO ABS: 0.4 x10 3/mm (ref 0.2–0.9)
Monocyte %: 2.6 %
NEUTROS ABS: 14.6 10*3/uL — AB (ref 1.4–6.5)
NEUTROS PCT: 93 %
PLATELETS: 228 10*3/uL (ref 150–440)
RBC: 4.19 10*6/uL (ref 3.80–5.20)
RDW: 14.6 % — AB (ref 11.5–14.5)
WBC: 15.7 10*3/uL — AB (ref 3.6–11.0)

## 2014-06-27 LAB — BASIC METABOLIC PANEL
ANION GAP: 4 — AB (ref 7–16)
BUN: 32 mg/dL — ABNORMAL HIGH (ref 7–18)
CO2: 27 mmol/L (ref 21–32)
CREATININE: 1.37 mg/dL — AB (ref 0.60–1.30)
Calcium, Total: 8.5 mg/dL (ref 8.5–10.1)
Chloride: 103 mmol/L (ref 98–107)
EGFR (Non-African Amer.): 41 — ABNORMAL LOW
GFR CALC AF AMER: 50 — AB
Glucose: 146 mg/dL — ABNORMAL HIGH (ref 65–99)
Osmolality: 278 (ref 275–301)
Potassium: 4.2 mmol/L (ref 3.5–5.1)
SODIUM: 134 mmol/L — AB (ref 136–145)

## 2014-06-27 LAB — CULTURE, BLOOD (SINGLE)

## 2014-06-28 ENCOUNTER — Telehealth: Payer: Self-pay | Admitting: Internal Medicine

## 2014-06-28 DIAGNOSIS — I361 Nonrheumatic tricuspid (valve) insufficiency: Secondary | ICD-10-CM

## 2014-06-28 LAB — BASIC METABOLIC PANEL
Anion Gap: 3 — ABNORMAL LOW (ref 7–16)
BUN: 37 mg/dL — AB (ref 7–18)
CHLORIDE: 102 mmol/L (ref 98–107)
CREATININE: 1.42 mg/dL — AB (ref 0.60–1.30)
Calcium, Total: 8.8 mg/dL (ref 8.5–10.1)
Co2: 30 mmol/L (ref 21–32)
EGFR (Non-African Amer.): 39 — ABNORMAL LOW
GFR CALC AF AMER: 48 — AB
GLUCOSE: 119 mg/dL — AB (ref 65–99)
OSMOLALITY: 280 (ref 275–301)
Potassium: 4.5 mmol/L (ref 3.5–5.1)
Sodium: 135 mmol/L — ABNORMAL LOW (ref 136–145)

## 2014-06-28 LAB — PROTIME-INR
INR: 2.9
Prothrombin Time: 29.7 secs — ABNORMAL HIGH (ref 11.5–14.7)

## 2014-06-28 LAB — EXPECTORATED SPUTUM ASSESSMENT W REFEX TO RESP CULTURE

## 2014-06-28 NOTE — Telephone Encounter (Signed)
Please cancel the 11:15 appt for Allegretti on Thursday  Dec 10th and give it to Ms Lurlean LeydenReardon.  Ms Allegretti was added over the weekend per conversation with her prior PCP but does not need the appt

## 2014-06-28 NOTE — Telephone Encounter (Signed)
Please advise can you do this

## 2014-06-28 NOTE — Telephone Encounter (Signed)
Shawn from [facility] called for a hospital f/u appointment for the patient.  She was admitted with COPD. Patient will be discharged today, and needs to see the doctor in 3 to 4 days.  They would like for the patient to be contacted with the f/u appt.  You do not have any open slots.  Please advise as to where you would like the patient to be put on the schedule.

## 2014-06-29 LAB — BASIC METABOLIC PANEL
BUN: 32 mg/dL — AB (ref 4–21)
Creatinine: 1.4 mg/dL — AB (ref 0.5–1.1)
POTASSIUM: 4.2 mmol/L (ref 3.4–5.3)
SODIUM: 134 mmol/L — AB (ref 137–147)

## 2014-06-29 LAB — CBC AND DIFFERENTIAL
HCT: 38 % (ref 36–46)
Hemoglobin: 12.5 g/dL (ref 12.0–16.0)
Neutrophils Absolute: 15 /uL
Platelets: 228 10*3/uL (ref 150–399)
WBC: 15.7 10^3/mL

## 2014-06-29 NOTE — Telephone Encounter (Signed)
Patient scheduled. And aware of appointment sent to [facility] for records.

## 2014-06-30 ENCOUNTER — Telehealth: Payer: Self-pay | Admitting: *Deleted

## 2014-06-30 NOTE — Telephone Encounter (Signed)
Discharge date: 06/28/14   Transition Care Management Follow-up Telephone Call  How have you been since you were released from the hospital? Pt states "getting some rest, still coughing real bad"  - she was consistently coughing    Do you understand why you were in the hospital? YES   Do you understand the discharge instrcutions? YES  Items Reviewed:  Medications reviewed: YES  Allergies reviewed: NO  Dietary changes reviewed: NO  Referrals reviewed: N/A   Functional Questionnaire:   Activities of Daily Living (ADLs):   She states they are independent in the following:  States they require assistance with the following:     Any transportation issues/concerns?: NO   Any patient concerns? NO   Confirmed importance and date/time of follow-up visits scheduled: YES - Dec 10th at 11:15   Confirmed with patient if condition begins to worsen call PCP or go to the ER.  Patient was given the Call-a-Nurse line [phone]: YES

## 2014-07-01 ENCOUNTER — Encounter: Payer: Self-pay | Admitting: Internal Medicine

## 2014-07-01 ENCOUNTER — Ambulatory Visit (INDEPENDENT_AMBULATORY_CARE_PROVIDER_SITE_OTHER): Payer: BC Managed Care – PPO | Admitting: Internal Medicine

## 2014-07-01 VITALS — BP 106/68 | HR 81 | Temp 98.7°F | Resp 14 | Ht 68.0 in | Wt 285.2 lb

## 2014-07-01 DIAGNOSIS — J9600 Acute respiratory failure, unspecified whether with hypoxia or hypercapnia: Secondary | ICD-10-CM

## 2014-07-01 DIAGNOSIS — J189 Pneumonia, unspecified organism: Secondary | ICD-10-CM

## 2014-07-01 DIAGNOSIS — B3781 Candidal esophagitis: Secondary | ICD-10-CM

## 2014-07-01 DIAGNOSIS — K219 Gastro-esophageal reflux disease without esophagitis: Secondary | ICD-10-CM

## 2014-07-01 MED ORDER — ALBUTEROL SULFATE HFA 108 (90 BASE) MCG/ACT IN AERS: 2.0000 | INHALATION_SPRAY | Freq: Four times a day (QID) | RESPIRATORY_TRACT | Status: DC | PRN

## 2014-07-01 MED ORDER — NYSTATIN 100000 UNIT/ML MT SUSP: 500000.0000 [IU] | Freq: Four times a day (QID) | OROMUCOSAL | Status: DC

## 2014-07-01 MED ORDER — HYDROCOD POLST-CHLORPHEN POLST 10-8 MG/5ML PO LQCR: 5.0000 mL | Freq: Three times a day (TID) | ORAL | Status: DC | PRN

## 2014-07-01 NOTE — Patient Instructions (Addendum)
Generic pepcid (famotdine ) 20 mg twice daily for the reflex/indigestion   Continue the tussionex and benzonotate to control cough  Adding albuterol MDI every 6 hours if needed for bronchospasm   Adding Nystatin 4 times daily for the oral thrush

## 2014-07-01 NOTE — Progress Notes (Addendum)
Patient ID: Maria JarvisCynthia M Bigler, female   DOB: October 02, 1945, 68 y.o.   MRN: [id number]    Patient Active Problem List   Diagnosis Date Noted  . Anemia 10/08/2011    Priority: Low  . Esophageal thrush 07/03/2014  . GERD (gastroesophageal reflux disease) 07/03/2014  . ARF (acute respiratory failure) 06/23/2014  . Syncope and collapse 11/08/2013  . Edema 11/08/2013  . Acute renal failure 10/28/2013  . Chest pain, unspecified 10/27/2013  . Depression, major, recurrent 09/28/2013  . Knee pain, bilateral 09/28/2013  . Routine general medical examination at a health care facility 07/26/2012  . Migraine 03/27/2012  . Obesity, Class III, BMI 40-49.9 (morbid obesity) 11/20/2011  . Post menopausal syndrome 11/20/2011  . Headache 10/08/2011  . Hypothyroidism 10/08/2011  . Aortic valve disorder 10/08/2011  . Benign hypertension 10/08/2011  . Mitral valve disorder 10/08/2011  . Hyperlipidemia 10/08/2011  . OSA on CPAP 10/08/2011  . Acute diastolic CHF (congestive heart failure) 10/08/2011    Subjective:  CC:   Chief Complaint  Patient presents with  . Follow-up    hospital follow up for Pneumonia    HPI:   Maria Torres is a 68 y.o. female who presents for Hospital follow up.  Patient was [facility] on Dec 1 after failing outpatient therapy for acute bronchitis progressive to acute respiratory failure on Dec 1  And was discharged on Dec 7 /  CXR , blood and sputum cultures were normal, and  ECHO was done which showed normal EF ,  And aortic valve was well seated. she was discharged with the diagnosis of bronchitis.  She continues to have persistent coughing despite using tussionex twice daily and taking a steroid taper.   Marland Kitchen.   2) Morbid obesity:  She has lost over 30 lbs since her last visit vy vhanging her diet and walking daily. She feel generally better about hersled and notes less fatigue  3) Odynophagia:  She has been having  Reflux and pain with swallowing since  discharge    Past Medical History  Diagnosis Date  . Morbid obesity   . Hx of glaucoma   . Thyroid disease     hypothyrodism  . GERD (gastroesophageal reflux disease)   . Hypertension   . Aortic stenosis     Due to bicuspid aortic valve. 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 Obstructive sleep apnea on CPAP   . Anemia   . Headache(784.0)   . Cervicalgia   . Mitral valve disorder   . Hyperlipidemia   . CHF (congestive heart failure)     Past Surgical History  Procedure Laterality Date  . Cholecystectomy    . Bilateral arthroscopic knee surgery  2001 & 2002  . Gastric banding surgery      @ [place] 04-16-96  . Unilateral total knee replacement  08/2002  . Total left hip replacement  01-20-2007  . 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 Cardiac catheterization  2004    [facility]  . Cardiac catheterization  [facility]    [facility]    The following portions of the patient's history were reviewed and updated as appropriate: Allergies, current medications, and problem list.    Review of Systems:   Patient denies headache, fevers, malaise, unintentional weight loss, skin rash, eye pain, sinus congestion and sinus pain,  s, dysphagia,  hemoptysis , dyspnea, wheezing, chest pain, palpitations, orthopnea, edema, abdominal pain, nausea, melena, diarrhea, constipation, flank pain, dysuria, hematuria, urinary  Frequency, nocturia, numbness, tingling, seizures,  Focal weakness, Loss of consciousness,  Tremor, insomnia, depression, anxiety, and suicidal ideation.     History   Social History  . Marital Status: Single    Spouse Name: N/A    Number of Children: N/A  . Years of Education: N/A   Occupational History  . Not on file.   Social History Main Topics  . Smoking status: Never Smoker   . Smokeless tobacco: Never Used  . Alcohol Use: 0.0 oz/week     0 Not specified per week  . Drug Use: No  . Sexual Activity: Not on file   Other Topics Concern  . Not on file   Social History Narrative    Objective:  Filed Vitals:   07/01/14 1129  BP: 106/68  Pulse: 81  Temp: 98.7 F (37.1 C)  Resp: 14     General appearance: alert, cooperative and appears stated age Ears: normal TM's and external ear canals both ears Oral: thrush noted on tongue Throat: lips, mucosa, and tongue normal; teeth and gums normal Neck: no adenopathy, no carotid bruit, supple, symmetrical, trachea midline and thyroid not enlarged, symmetric, no tenderness/mass/nodules Back: symmetric, no curvature. ROM normal. No CVA tenderness. Lungs: clear to auscultation bilaterally Heart: regular rate and rhythm, S1, S2 normal, no murmur, click, rub or gallop Abdomen: soft, non-tender; bowel sounds normal; no masses,  no organomegaly Pulses: 2+ and symmetric Skin: Skin color, texture, turgor normal. No rashes or lesions Lymph nodes: Cervical, supraclavicular, and axillary nodes normal.  Assessment and Plan:  Problem List Items Addressed This Visit    Obesity, Class III, BMI 40-49.9 (morbid obesity) - Primary    I have congratulated her in weight los of 46 lbs since July and encouraged  Continued weight loss with goal of 10% of body weigh over the next 6 months using a low glycemic index diet and regular exercise a minimum of 5 days per week.    Body mass index is 43.38 kg/(m^2).       GERD (gastroesophageal reflux disease)    famotidne 20 mg bid.       Esophageal thrush    secondary to steroid use. Nystatin oral suspension prescribed.       CAP (community acquired pneumonia)    She was admitted and treated for pneeumonia and hypoxia and continues to have persistent cough, vocal cord dysfunction and laryngitis.       RESOLVED: ARF (acute respiratory failure)    Improved with persistent cough.  continue steroid taper nad tussionex.  Adding albuterol

## 2014-07-01 NOTE — Progress Notes (Signed)
Pre-visit discussion using our clinic review tool. No additional management support is needed unless otherwise documented below in the visit note.  

## 2014-07-03 DIAGNOSIS — B3781 Candidal esophagitis: Secondary | ICD-10-CM | POA: Insufficient documentation

## 2014-07-03 DIAGNOSIS — K219 Gastro-esophageal reflux disease without esophagitis: Secondary | ICD-10-CM | POA: Insufficient documentation

## 2014-07-03 NOTE — Assessment & Plan Note (Signed)
secondary to steroid use. Nystatin oral suspension prescribed.

## 2014-07-03 NOTE — Assessment & Plan Note (Signed)
I have congratulated her in weight los of 46 lbs since July and encouraged  Continued weight loss with goal of 10% of body weigh over the next 6 months using a low glycemic index diet and regular exercise a minimum of 5 days per week.    Body mass index is 43.38 kg/(m^2).

## 2014-07-03 NOTE — Assessment & Plan Note (Signed)
famotidne 20 mg bid.

## 2014-07-03 NOTE — Assessment & Plan Note (Signed)
Improved with persistent cough.  continue steroid taper nad tussionex.  Adding albuterol

## 2014-07-05 ENCOUNTER — Other Ambulatory Visit: Payer: Self-pay | Admitting: *Deleted

## 2014-07-05 MED ORDER — HYDROCOD POLST-CHLORPHEN POLST 10-8 MG/5ML PO LQCR: 5.0000 mL | Freq: Two times a day (BID) | ORAL | Status: DC | PRN

## 2014-07-06 ENCOUNTER — Encounter: Payer: Self-pay | Admitting: Internal Medicine

## 2014-07-07 ENCOUNTER — Telehealth: Payer: Self-pay | Admitting: Internal Medicine

## 2014-07-07 ENCOUNTER — Emergency Department: Payer: Self-pay | Admitting: Emergency Medicine

## 2014-07-07 LAB — PROTIME-INR
INR: 1.6
Prothrombin Time: 18.3 secs — ABNORMAL HIGH (ref 11.5–14.7)

## 2014-07-07 LAB — BASIC METABOLIC PANEL
Anion Gap: 5 — ABNORMAL LOW (ref 7–16)
BUN: 15 mg/dL (ref 7–18)
CREATININE: 1.26 mg/dL (ref 0.60–1.30)
Calcium, Total: 8 mg/dL — ABNORMAL LOW (ref 8.5–10.1)
Chloride: 109 mmol/L — ABNORMAL HIGH (ref 98–107)
Co2: 25 mmol/L (ref 21–32)
EGFR (Non-African Amer.): 45 — ABNORMAL LOW
GFR CALC AF AMER: 55 — AB
Glucose: 84 mg/dL (ref 65–99)
OSMOLALITY: 278 (ref 275–301)
Potassium: 4.2 mmol/L (ref 3.5–5.1)
SODIUM: 139 mmol/L (ref 136–145)

## 2014-07-07 LAB — CBC
HCT: 37.8 % (ref 35.0–47.0)
HGB: 12.3 g/dL (ref 12.0–16.0)
MCH: 29.9 pg (ref 26.0–34.0)
MCHC: 32.6 g/dL (ref 32.0–36.0)
MCV: 92 fL (ref 80–100)
Platelet: 211 10*3/uL (ref 150–440)
RBC: 4.12 10*6/uL (ref 3.80–5.20)
RDW: 15.4 % — ABNORMAL HIGH (ref 11.5–14.5)
WBC: 10.5 10*3/uL (ref 3.6–11.0)

## 2014-07-07 LAB — TROPONIN I: Troponin-I: <0.02 ng/mL

## 2014-07-07 NOTE — Telephone Encounter (Signed)
Message from call a nurse reviewed.  I can try and treat her with a prednisone taper since she already has  Symbicort and albuterol,  But if she is  Short of breath and/or wheezing. she needs to go to the ER as advised by RN

## 2014-07-07 NOTE — Telephone Encounter (Signed)
Called pt, she states she is in the ER.

## 2014-07-08 ENCOUNTER — Telehealth: Payer: Self-pay | Admitting: Internal Medicine

## 2014-07-08 NOTE — Telephone Encounter (Signed)
Pt needs HFU dx: pneumonia, d/c12/7/15. Pt seen in the ER for pneumonia symptoms on 07/07/14. Please advise where to add to schedule/msn

## 2014-07-09 NOTE — Telephone Encounter (Signed)
Spoke to pt, states symptoms are about the same as when seen in ED. On abx and prednisone currently. Stated ED advised Monday follow up. Added to schedule for 1:00 on 07/12/14 per Dr. Darrick Huntsmanullo

## 2014-07-12 ENCOUNTER — Ambulatory Visit (INDEPENDENT_AMBULATORY_CARE_PROVIDER_SITE_OTHER): Payer: BC Managed Care – PPO | Admitting: Internal Medicine

## 2014-07-12 ENCOUNTER — Encounter: Payer: Self-pay | Admitting: Internal Medicine

## 2014-07-12 VITALS — BP 126/74 | HR 83 | Temp 98.3°F | Resp 18 | Wt 291.0 lb

## 2014-07-12 DIAGNOSIS — Z952 Presence of prosthetic heart valve: Secondary | ICD-10-CM

## 2014-07-12 DIAGNOSIS — R053 Chronic cough: Secondary | ICD-10-CM

## 2014-07-12 DIAGNOSIS — Z954 Presence of other heart-valve replacement: Secondary | ICD-10-CM

## 2014-07-12 DIAGNOSIS — B3781 Candidal esophagitis: Secondary | ICD-10-CM

## 2014-07-12 DIAGNOSIS — R05 Cough: Secondary | ICD-10-CM

## 2014-07-12 DIAGNOSIS — R059 Cough, unspecified: Secondary | ICD-10-CM

## 2014-07-12 DIAGNOSIS — Z7901 Long term (current) use of anticoagulants: Secondary | ICD-10-CM

## 2014-07-12 DIAGNOSIS — R062 Wheezing: Secondary | ICD-10-CM

## 2014-07-12 DIAGNOSIS — Z8774 Personal history of (corrected) congenital malformations of heart and circulatory system: Secondary | ICD-10-CM

## 2014-07-12 MED ORDER — HYDROCOD POLST-CHLORPHEN POLST 10-8 MG/5ML PO LQCR: 5.0000 mL | Freq: Two times a day (BID) | ORAL | Status: DC | PRN

## 2014-07-12 NOTE — Progress Notes (Signed)
Pre visit review using our clinic review tool, if applicable. No additional management support is needed unless otherwise documented below in the visit note. 

## 2014-07-12 NOTE — Progress Notes (Addendum)
Patient ID: Maria JarvisCynthia M Torres, female   DOB: 1946-06-11, 68 y.o.   MRN: [id number]   Patient Active Problem List   Diagnosis Date Noted  . Anemia 10/08/2011    Priority: Low  . Long term current use of anticoagulant therapy 07/13/2014  . Cough 07/13/2014  . History of bicuspid aortic valve 07/13/2014  . S/P aortic valve replacement 07/13/2014  . Esophageal thrush 07/03/2014  . GERD (gastroesophageal reflux disease) 07/03/2014  . Syncope and collapse 11/08/2013  . Acute renal failure 10/28/2013  . Chest pain, unspecified 10/27/2013  . Depression, major, recurrent 09/28/2013  . Knee pain, bilateral 09/28/2013  . Routine general medical examination at a health care facility 07/26/2012  . Obesity, Class III, BMI 40-49.9 (morbid obesity) 11/20/2011  . Post menopausal syndrome 11/20/2011  . Hypothyroidism 10/08/2011  . Benign hypertension 10/08/2011  . Mitral valve disorder 10/08/2011  . Hyperlipidemia 10/08/2011  . OSA on CPAP 10/08/2011    Subjective:  CC:   Chief Complaint  Patient presents with  . Hospitalization Follow-up    pneumonia, still coughing and wheezing heavily at this time.    HPI:   Maria Torres is a 68 y.o. female who presents for Persistent coughing, increased work of breathing .  Patient is a nonsmoker,  Uses CPAP,  Has diastolic dysfunction with preserved EF by recent ECHO.   Patient presents for the third time since early November with persistent, unresolved symptoms.   History:  She was treated by phone on 11/25 for one week history of cough productive of purulent sputum, headache and wheezing consistent with sinusitis/bronchitis with levaquin  X 7 days, along with a  day prednisone taper and prn albuterol MDI. . Was seen in office by Dr Lorin PicketScott on Dec 1 for failure to improve and referred to ER for admission due to increased work of breathing and wheezing on exam.  Hospital[facility] and released on Dec 7 with diagnosis of bronchitis  after extensive imaging workup with chest x ray, CT chest, blood/sputum cultures  And 2D ECHO failed to indicate pneumonia, , PE,  systolic dysfunction or abnormality of prosthetic aortic valve. .  Was still coughing and wheezing at discharge, but no  pulmonology evaluation was obtained in house for unclear reasons.     Dec 12:  7 day hospital followup.   She continues to have persistent coughing despite use of tussionex twice daily and taking a steroid taper.    Dec 18th: Was seen in Er last week for persistent wheezing,  cxr again normal.,  Sent home with a 6 days steroid taper.  Cough is nonproductive   Dec 22; Patient coughing every 1-2 minutes,  Exam suggesting vocal cord dysfunction , wheezing heard only at end expiration when she is forcing air out.  She is still using tussionex.  Coughing is persistent all day long and aggravated by exertion and and talking .   Past Medical History  Diagnosis Date  . Morbid obesity   . Hx of glaucoma   . Thyroid disease     hypothyrodism  . GERD (gastroesophageal reflux disease)   . Hypertension   . Aortic stenosis     Due to bicuspid aortic valve. 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 at [facility]  . 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 Obstructive sleep apnea on CPAP   . Anemia   . Headache(784.0)   . Cervicalgia   .  Mitral valve disorder   . Hyperlipidemia   . CHF (congestive heart failure)     Past Surgical History  Procedure Laterality Date  . Cholecystectomy    . Bilateral arthroscopic knee surgery  2001 & 2002  . Gastric banding surgery      @ [place] 04-16-96  . Unilateral total knee replacement  08/2002  . Total left hip replacement  01-20-2007  . 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 Cardiac catheterization  2004    [facility]  . Cardiac catheterization  2008    [facility]       The following portions of the patient's history were  reviewed and updated as appropriate: Allergies, current medications, and problem list.    Review of Systems:   Patient denies headache, fevers, malaise, unintentional weight loss, skin rash, eye pain, sinus congestion and sinus pain, sore throat, dysphagia,  hemoptysis , cough, dyspnea, wheezing, chest pain, palpitations, orthopnea, edema, abdominal pain, nausea, melena, diarrhea, constipation, flank pain, dysuria, hematuria, urinary  Frequency, nocturia, numbness, tingling, seizures,  Focal weakness, Loss of consciousness,  Tremor, insomnia, depression, anxiety, and suicidal ideation.     History   Social History  . Marital Status: Single    Spouse Name: N/A    Number of Children: N/A  . Years of Education: N/A   Occupational History  . Not on file.   Social History Main Topics  . Smoking status: Never Smoker   . Smokeless tobacco: Never Used  . Alcohol Use: 0.0 oz/week    0 Not specified per week  . Drug Use: No  . Sexual Activity: Not on file   Other Topics Concern  . Not on file   Social History Narrative    Objective:  Filed Vitals:   07/12/14 1306  BP: 126/74  Pulse: 83  Temp: 98.3 F (36.8 C)  Resp: 18     General appearance: alert, cooperative.   Frequent nonproductive cough every 20-30 seconds Ears: normal TM's and external ear canals both ears Throat: lips, mucosa, and tongue normal; teeth and gums normal Neck: no adenopathy, no carotid bruit, supple, symmetrical, trachea midline and thyroid not enlarged, symmetric, no tenderness/mass/nodules Back: symmetric, no curvature. ROM normal. No CVA tenderness. Lungs: clear to auscultation bilaterally with nose breathing,  End expiration wheezing suggestive of VCD Heart: regular rate and rhythm, S1, S2 normal, no murmur, click, rub or gallop Abdomen: soft, non-tender; bowel sounds normal; no masses,  no organomegaly Pulses: 2+ and symmetric Skin: Skin color, texture, turgor normal. No rashes or lesions Lymph  nodes: Cervical, supraclavicular, and axillary nodes normal.  Assessment and Plan:  Problem List Items Addressed This Visit      Digestive   Esophageal thrush    Resolved on exam, secondary to steroid use, with  Nystatin oral suspension prescribed at dec 12 hospital follow up.         Other   Cough    Her symptoms  of cough have been present since Nov 18th.  She has no signs of infection and her lung exam today is more consistent with VCD , but she cannot stop coughing despite maximal use of tussionex , steroids and continued use of Symbicort  MDI  Bid and /albuterol prn .  DDX includes intrinsic lung disease from COPD vs eosinophilic esophagitis with VCD.  BOOP less likely given exam and recent, Ct.  Referring to pulmonology  for urgent evaluation . Remote consideration of Legionella given use  of CPAP,  But [facility] and she was treated with levaquin initially  (not sure if urinary legionella ag was done)    Long term current use of anticoagulant therapy    Now [facility]ologist for proximity reasons.   Lab Results  Component Value Date   INR 3.1* 11/06/2013   INR 2.2* 10/27/2013   INR 1.7* 09/14/2013   PROTIME 23.0* 04/10/2011        Other Visit Diagnoses    Wheezing on expiration    -  Primary    Relevant Orders       Ambulatory referral to Pulmonology    Persistent cough for 3 weeks or longer        Relevant Orders       Ambulatory referral to Pulmonology

## 2014-07-13 ENCOUNTER — Encounter: Payer: Self-pay | Admitting: Internal Medicine

## 2014-07-13 ENCOUNTER — Ambulatory Visit (INDEPENDENT_AMBULATORY_CARE_PROVIDER_SITE_OTHER): Payer: BC Managed Care – PPO | Admitting: Internal Medicine

## 2014-07-13 ENCOUNTER — Other Ambulatory Visit: Payer: Self-pay | Admitting: Internal Medicine

## 2014-07-13 VITALS — BP 140/72 | HR 88 | Temp 98.3°F | Ht 68.0 in | Wt 294.0 lb

## 2014-07-13 DIAGNOSIS — Z952 Presence of prosthetic heart valve: Secondary | ICD-10-CM | POA: Insufficient documentation

## 2014-07-13 DIAGNOSIS — R05 Cough: Secondary | ICD-10-CM | POA: Insufficient documentation

## 2014-07-13 DIAGNOSIS — R062 Wheezing: Secondary | ICD-10-CM

## 2014-07-13 DIAGNOSIS — R059 Cough, unspecified: Secondary | ICD-10-CM

## 2014-07-13 DIAGNOSIS — Z9229 Personal history of other drug therapy: Secondary | ICD-10-CM | POA: Insufficient documentation

## 2014-07-13 DIAGNOSIS — Z7901 Long term (current) use of anticoagulants: Secondary | ICD-10-CM | POA: Insufficient documentation

## 2014-07-13 DIAGNOSIS — Z8774 Personal history of (corrected) congenital malformations of heart and circulatory system: Secondary | ICD-10-CM | POA: Insufficient documentation

## 2014-07-13 MED ORDER — LOSARTAN POTASSIUM 50 MG PO TABS: 50.0000 mg | ORAL_TABLET | Freq: Every day | ORAL | Status: DC

## 2014-07-13 MED ORDER — ALBUTEROL SULFATE (2.5 MG/3ML) 0.083% IN NEBU: 2.5000 mg | INHALATION_SOLUTION | RESPIRATORY_TRACT | Status: AC | PRN

## 2014-07-13 MED ORDER — TRAMADOL HCL 50 MG PO TABS: 50.0000 mg | ORAL_TABLET | ORAL | Status: DC | PRN

## 2014-07-13 MED ORDER — LEVALBUTEROL HCL 0.63 MG/3ML IN NEBU
0.6300 mg | INHALATION_SOLUTION | Freq: Once | RESPIRATORY_TRACT | Status: AC
  Administered 2014-07-13: 0.63 mg via RESPIRATORY_TRACT

## 2014-07-13 NOTE — Assessment & Plan Note (Addendum)
Multifactorial - Post infectious, UACS, possible VCD, Obesity, Possible new onset ACEI allergy, Possible COPD   The standardized cough guidelines published in Chest by [employer] in 2006 are still the best available and consist of a multiple step process (up to 12!) , not a single office visit,  and are intended  to address this problem logically,  with an alogrithm dependent on response to empiric treatment at  each progressive step  to determine a specific diagnosis with  minimal addtional testing needed. Therefore if adherence is an issue or can't be accurately verified,  it's very unlikely the standard evaluation and treatment will be successful here.    Furthermore, response to therapy (other than acute cough suppression, which should only be used short term with avoidance of narcotic containing cough syrups if possible), can be a gradual process for which the patient may perceive immediate benefit.  Unlike going to an eye doctor where the best perscription is almost always the first one and is immediately effective, this is almost never the case in the management of chronic cough syndromes. Therefore the patient needs to commit up front to consistently adhere to recommendations  for up to 6 weeks of therapy directed at the likely underlying problem(s) before the response can be reasonably evaluated.    Plan - Cough Suppression Stop tussionex  Take delsym two tsp every 12 hours and supplement if needed with  tramadol 50 mg up to 2 every 4 hours to suppress the urge to cough. Swallowing water or using ice chips/non mint and menthol containing candies (such as lifesavers or sugarless jolly ranchers) are also effective.  You should rest your voice and avoid activities that you know make you cough. Once you have eliminated the cough for 3 straight days try reducing the tramadol first,  then the delsym as tolerated.  - continue with symbicort - albuterol nebulizer machine Q4hrs PRN  wheezing\sob - ENT Eval for possible  - Stop lisinopril, PMD to restart new BP med - f\u in 1 month, will evaluate for PFTs at that time - continue with CPAP.

## 2014-07-13 NOTE — Progress Notes (Signed)
Date: 07/13/2014  MRN# [id number] Maria Torres 01/05/1946  Referring Physician:   DAIZY OUTEN is a 68 y.o. old female seen in consultation for persistent dry cough  CC:  Chief Complaint  Patient presents with  . Pulmonary Consult    Persistant cough x3 weeks per Dr. Duncan Dull. Cough is dry. Pt is actively wheezing during check in.    HPI:  Patient presents today for an acute visit of persistent dry coughing since being discharged from the hospital on 06/28/14 for Bronchitis\CAP, she was referred by Dr. Darrick Huntsman  History as follows: Patient was treated by phone on 11/25 for one week history of cough productive of purulent sputum, headache and wheezing consistent with sinusitis/bronchitis with levaquin X 7 days, along with a day prednisone taper and prn albuterol MDI. . Was seen in office by Dr Lorin Picket on Dec 1 for failure to improve and referred to ER for admission due to increased work of breathing and wheezing on exam. Hospitalized for 6 days at [facility] and released on Dec 7 with diagnosis of bronchitis after extensive imaging workup with chest x ray, CT chest, blood/sputum cultures And 2D ECHO failed to indicate pneumonia, , PE, systolic dysfunction or abnormality of prosthetic aortic valve. . Was still coughing and wheezing at discharge, was seen by Dr. Belia Heman during the inpatient setting, advised to discharge on Prednisone taper, and continue with diuresis.   Dec 12: 7 day hospital followup at PMD office. Continues to have persistent coughing despite use of tussionex twice daily and taking a steroid taper.   Dec 18th: Was seen in ED, 07/07/14 for persistent wheezing, cxr again normal., Sent home with a 6 days steroid taper. Cough is nonproductive   Dec 22; Patient coughing every 1-2 minutes, Exam suggesting vocal cord dysfunction , wheezing heard only at end expiration when she is forcing air out. She is still using tussionex. Coughing is persistent all day long and  aggravated by exertion and and talking .  At today's pulmonary visit she is noted to be coughing persistently and with audible wheezing, she is accompanied by niece.  Cough is dry. Patient has 2 days steroids left from her ED visit on 07/07/14. She was started on Symbicort by her PMD recently, currently she does not feel as this is providing much relief.  Patient has a history of OSA, and states that she has been wearing her cpap nightly.  No smoking history, but second hand cigar smoke exposure for about 20years.  Patient plans on going to the beach for the Christmas holidays.   PMHX:   Past Medical History  Diagnosis Date  . Morbid obesity   . Hx of glaucoma   . Thyroid disease     hypothyrodism  . GERD (gastroesophageal reflux disease)   . Hypertension   . Aortic stenosis     Due to bicuspid aortic valve. 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 Obstructive sleep apnea on CPAP   . Anemia   . Headache(784.0)   . Cervicalgia   . Mitral valve disorder   . Hyperlipidemia   . CHF (congestive heart failure)    Surgical Hx:  Past Surgical History  Procedure Laterality Date  . Cholecystectomy    . Bilateral arthroscopic knee surgery  2001 & 2002  . Gastric banding surgery      @ [place] 04-16-96  .  Unilateral total knee replacement  08/2002  . Total left hip replacement  01-20-2007  . 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 Cardiac catheterization  2004    [facility]  . Cardiac catheterization  2008    [facility]   Family Hx:  Family History  Problem Relation Age of Onset  . Coronary artery disease Mother   . Heart disease Mother 950  . COPD Mother   . Hyperlipidemia Mother   . Cancer Father     testicular  . Pulmonary embolism Father   . Obesity Sister     and knee problems  . Cancer Sister     breast  . Cancer Brother     throat    Social Hx:   History  Substance Use Topics  . Smoking status: Never Smoker   . Smokeless tobacco: Never Used  . Alcohol Use: 0.0 oz/week    0 Not specified per week   Medication:   Current Outpatient Rx  Name  Route  Sig  Dispense  Refill  . albuterol (PROVENTIL HFA;VENTOLIN HFA) 108 (90 BASE) MCG/ACT inhaler   Inhalation   Inhale 3 puffs into the lungs 4 (four) times daily.         Marland Kitchen. ALPRAZolam (XANAX) 0.25 MG tablet   Oral   Take 1 tablet (0.25 mg total) by mouth daily as needed for sleep or anxiety.   30 tablet   3   . amoxicillin (AMOXIL) 250 MG capsule   Oral   Take 250 mg by mouth 3 (three) times daily. Before dental procedures         . benzonatate (TESSALON) 200 MG capsule   Oral   Take 1 capsule (200 mg total) by mouth 3 (three) times daily as needed for cough.   60 capsule   1   . budesonide-formoterol (SYMBICORT) 160-4.5 MCG/ACT inhaler   Inhalation   Inhale 2 puffs into the lungs 2 (two) times daily.   1 Inhaler   12   . buPROPion (WELLBUTRIN XL) 300 MG 24 hr tablet      take 1 tablet by mouth once daily   90 tablet   3   . carvedilol (COREG) 3.125 MG tablet      take 1 tablet by mouth every 12 hours   60 tablet   5   . chlorpheniramine-HYDROcodone (TUSSIONEX PENNKINETIC ER) 10-8 MG/5ML LQCR   Oral   Take 5 mLs by mouth every 12 (twelve) hours as needed for cough.   200 mL   0   . citalopram (CELEXA) 40 MG tablet      take 1 tablet by mouth once daily   30 tablet   2   . levothyroxine (SYNTHROID, LEVOTHROID) 150 MCG tablet   Oral   Take 1 tablet (150 mcg total) by mouth daily.   30 tablet   2   . lisinopril (PRINIVIL,ZESTRIL) 5 MG tablet   Oral   Take 1 tablet (5 mg total) by mouth daily.   90 tablet   1   . torsemide (DEMADEX) 20 MG tablet   Oral   Take 20 mg by mouth daily.          . traMADol (ULTRAM) 50 MG tablet   Oral   Take 1-2 tablets (50-100 mg total) by mouth every 4 (four) hours as needed.   40 tablet    0   . warfarin (COUMADIN) 10 MG tablet  TAKE 1 TABLET BY MOUTH AS DIRECTED   60 tablet   5   . albuterol (PROVENTIL) (2.5 MG/3ML) 0.083% nebulizer solution   Nebulization   Take 3 mLs (2.5 mg total) by nebulization every 4 (four) hours as needed for wheezing or shortness of breath.   150 mL   5   . hydrocortisone 2.5 % cream   Topical   Apply topically 2 (two) times daily. Patient not taking: Reported on 07/13/2014   30 g   0       Allergies:  Review of patient's allergies indicates no known allergies.  Review of Systems: Gen:  Denies  fever, sweats, chills HEENT: Denies blurred vision, double vision, ear pain, eye pain, hearing loss, nose bleeds, sore throat Cvc:  No dizziness, chest pain or heaviness Resp:   Admits to cough, shortness of breath Gi: Denies swallowing difficulty, stomach pain, nausea or vomiting, diarrhea, constipation, bowel incontinence Gu:  Denies bladder incontinence, burning urine Ext:   No Joint pain, stiffness or swelling Skin: No skin rash, easy bruising or bleeding or hives Endoc:  No polyuria, polydipsia , polyphagia or weight change Psych: No depression, insomnia or hallucinations  Other:  All other systems negative  Physical Examination:   VS: BP 140/72 mmHg  Pulse 88  Temp(Src) 98.3 F (36.8 C) (Oral)  Ht 5\' 8"  (1.727 m)  Wt 294 lb (133.358 kg)  BMI 44.71 kg/m2  SpO2 94%  LMP 08/31/2013  General Appearance: No distress  Neuro:without focal findings, mental status, speech normal, alert and oriented, cranial nerves 2-12 intact, reflexes normal and symmetric, sensation grossly normal  HEENT: PERRLA, EOM intact, no ptosis, no other lesions noticed; Mallampati 3 Pulmonary: Pre BD treatment - audible wheezing, most in the posterior upper lobes, fine exp wheezes at the bases, dec breath sounds at the bases, cough (non-productive).  Post BD Treatment - audible wheezing decreased, wheezing in the posterior upper lobes decrease, inc BS at  that bases with exp wheezes CardiovascularNormal S1,S2.  No m/r/g.  Abdominal aorta pulsation normal.    Abdomen: Benign, Soft, non-tender, No masses, hepatosplenomegaly, No lymphadenopathy Renal:  No costovertebral tenderness  GU:  No performed at this time. Endoc: No evident thyromegaly, no signs of acromegaly or Cushing features Skin:   warm, no rashes, no ecchymosis  Extremities: normal, no cyanosis, clubbing, warm with normal capillary refill. Other findings:1+ pitting edema RLE, 2+pitting edema LLE   Rad results: (The following images and results were reviewed by Dr. Dema Severin).  CXR 07/07/14 FINDINGS: Cardiomediastinal silhouette is stable. Status post median sternotomy. Chronic elevation of the right hemidiaphragm again noted. Surgical clips in right axilla. Stable scarring right upper lobe. No acute infiltrate or pulmonary edema. Gastric band in upper abdomen.   IMPRESSION: No active cardiopulmonary disease.  No significant change feet  Chest CT 06/27/14 FINDINGS: Postop clips in the right axillary region. Prior median sternotomy for surgical repair of the ascending aorta and aortic valve. No adenopathy. Normal heart size. No pericardial or pleural effusion. No hiatal hernia.  Included upper abdomen demonstrates prior cholecystectomy and laparoscopic gastric banding. No acute upper abdominal findings.  Minor degenerative changes of the thoracic spine.  Lung windows demonstrate chronic bandlike scar in the right upper lobe. Minor dependent bibasilar subsegmental atelectasis. No focal pneumonia, collapse or consolidation. No interstitial process, edema, pleural fluid, or pneumothorax.  No acute osseous finding.   IMPRESSION: Stable postoperative findings.  Bi basilar subsegmental atelectasis  Chronic right upper lobe scarring  No acute airspace  process or pneumonia.  Negative for CHF.  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 Therefore if adherence is an issue or can't be accurately verified,  it's very unlikely the standard evaluation and treatment will be successful here.    Furthermore, response to therapy (other than acute cough suppression, which should only be used short term with avoidance of narcotic containing cough syrups if possible), can be a gradual process for which the patient may perceive immediate benefit.  Unlike going to an eye doctor where the best perscription is almost always the first one and is immediately effective, this is almost never the case in the management of chronic cough syndromes. Therefore the patient needs to commit up front to consistently adhere to recommendations  for up to 6 weeks of therapy directed at the likely underlying problem(s) before the response can be reasonably evaluated.    Plan - Cough Suppression Stop tussionex  Take delsym two tsp every 12 hours and supplement if needed with  tramadol 50 mg up to 2 every 4 hours to suppress the urge to cough. Swallowing water or using ice chips/non mint and menthol containing candies (such as lifesavers or sugarless jolly ranchers) are also effective.  You should rest your voice and avoid activities that you know make you cough. Once you have eliminated the cough for 3 straight days try reducing the tramadol first,  then the delsym as tolerated.  - continue with symbicort - albuterol nebulizer machine Q4hrs PRN wheezing\sob - ENT Eval for possible  - Stop lisinopril, PMD  to restart new BP med - f\u in 1 month, will evaluate for PFTs at that time - continue with CPAP.        Updated Medication List Outpatient Encounter Prescriptions as of 07/13/2014  Medication Sig  . albuterol (PROVENTIL HFA;VENTOLIN HFA) 108 (90 BASE) MCG/ACT inhaler Inhale 3 puffs into the lungs 4 (four) times daily.  Marland Kitchen. ALPRAZolam (XANAX) 0.25 MG tablet Take 1 tablet (0.25 mg total) by mouth daily as needed for sleep or anxiety.  Marland Kitchen. amoxicillin (AMOXIL) 250 MG capsule Take 250 mg by mouth 3 (three) times daily. Before dental procedures  . benzonatate (TESSALON) 200 MG capsule Take 1 capsule (200 mg total) by mouth 3 (three) times daily as needed for cough.  . budesonide-formoterol (SYMBICORT) 160-4.5 MCG/ACT inhaler Inhale 2 puffs into the lungs 2 (two) times daily.  Marland Kitchen. buPROPion (WELLBUTRIN XL) 300 MG 24 hr tablet take 1 tablet by mouth once daily  . carvedilol (COREG) 3.125 MG tablet take 1 tablet by mouth every 12 hours  . chlorpheniramine-HYDROcodone (TUSSIONEX PENNKINETIC ER) 10-8 MG/5ML LQCR Take 5 mLs by mouth every 12 (twelve) hours as needed for cough.  . citalopram (CELEXA) 40 MG tablet take 1 tablet by mouth once daily  . levothyroxine (SYNTHROID, LEVOTHROID) 150 MCG tablet Take 1 tablet (150 mcg total) by mouth daily.  Marland Kitchen. lisinopril (PRINIVIL,ZESTRIL) 5 MG tablet Take 1 tablet (5 mg total) by mouth daily.  Marland Kitchen. torsemide (DEMADEX) 20 MG  tablet Take 20 mg by mouth daily.   . traMADol (ULTRAM) 50 MG tablet Take 1-2 tablets (50-100 mg total) by mouth every 4 (four) hours as needed.  . warfarin (COUMADIN) 10 MG tablet TAKE 1 TABLET BY MOUTH AS DIRECTED  . [DISCONTINUED] dexlansoprazole (DEXILANT) 60 MG capsule Take 1 capsule (60 mg total) by mouth daily.  . [DISCONTINUED] traMADol (ULTRAM) 50 MG tablet Take 50 mg by mouth every 6 (six) hours as needed.  Marland Kitchen albuterol (PROVENTIL) (2.5 MG/3ML) 0.083% nebulizer solution Take 3 mLs (2.5 mg total) by nebulization every 4 (four) hours as  needed for wheezing or shortness of breath.  . hydrocortisone 2.5 % cream Apply topically 2 (two) times daily. (Patient not taking: Reported on 07/13/2014)  . [DISCONTINUED] chlorpheniramine-HYDROcodone (TUSSIONEX) 10-8 MG/5ML LQCR Take 5 mLs by mouth 2 (two) times daily.  . [DISCONTINUED] hyoscyamine (LEVSIN SL) 0.125 MG SL tablet Place 1 tablet (0.125 mg total) under the tongue every 4 (four) hours as needed. For esophageal spasm  . [DISCONTINUED] nystatin (MYCOSTATIN) 100000 UNIT/ML suspension Take 5 mLs (500,000 Units total) by mouth 4 (four) times daily.  . [DISCONTINUED] promethazine (PHENERGAN) 12.5 MG tablet Take 1 tablet (12.5 mg total) by mouth every 8 (eight) hours as needed for nausea.  . [DISCONTINUED] topiramate (TOPAMAX) 50 MG tablet take 1 tablet twice a day  . [DISCONTINUED] zoster vaccine live, PF, (ZOSTAVAX) 45409 UNT/0.65ML injection Inject 19,400 Units into the skin once.  . [EXPIRED] levalbuterol (XOPENEX) nebulizer solution 0.63 mg     Orders for this visit: Orders Placed This Encounter  Procedures  . DME Nebulizer machine  . Ambulatory referral to ENT    Referral Priority:  Routine    Referral Type:  Consultation    Referral Reason:  Specialty Services Required    Requested Specialty:  Otolaryngology    Number of Visits Requested:  1     Thank  you for the consultation and for allowing [facility] to assist in the care of your patient. Our recommendations are noted above.  Please contact us if we can be of further service.   Stephanie Acre, MD [facility] Office Number: [phone]

## 2014-07-13 NOTE — Assessment & Plan Note (Signed)
Resolved on exam, secondary to steroid use, with  Nystatin oral suspension prescribed at dec 12 hospital follow up.

## 2014-07-13 NOTE — Patient Instructions (Addendum)
Stop taking Tussionex and Lisinopril!  The key to effective treatment for your cough is eliminating the non-stop cycle of cough you're stuck in long enough to let your airway heal completely and then see if there is anything still making you cough once you stop the cough suppression, but this should take no more than 5 days to figure out.  First take Delsym two tsp every 12 hours and supplement if needed withTramadol 50 mg up to 1-2 every 4 hours to suppress the urge to cough at all or even clear your throat. Swallowing water or using ice chips/non mint and menthol containing candies (such as lifesavers or sugarless jolly ranchers) are also effective. You should rest your voice and avoid activities that you know make you cough.  Once you have eliminated the cough for 3 straight days try reducing the tramadol first, then the delsym as tolerated.   Follow up with Dr. Dema SeverinMungal in 1 month.

## 2014-07-13 NOTE — Assessment & Plan Note (Addendum)
[facility] Lupita [place], for proximity reasons.   Lab Results  Component Value Date   INR 3.1* 11/06/2013   INR 2.2* 10/27/2013   INR 1.7* 09/14/2013   PROTIME 23.0* 04/10/2011

## 2014-07-13 NOTE — Assessment & Plan Note (Signed)
Her symptoms  of cough have been present since Nov 18th.  She has no signs of infection and her lung exam today is more consistent with VCD , but she cannot stop coughing despite maximal use of tussionex , steroids and continued use of Symbicort  MDI  Bid and /albuterol prn .  DDX includes intrinsic lung disease from COPD vs eosinophilic esophagitis with VCD.  BOOP less likely given exam and recent, Ct.  Referring to pulmonology  for urgent evaluation

## 2014-07-19 ENCOUNTER — Telehealth: Payer: Self-pay | Admitting: Internal Medicine

## 2014-07-20 NOTE — Telephone Encounter (Signed)
Patient notified FMLA ready and received fax number from patient and faxed as requested sent copy to scan and retained original for patient pick up.

## 2014-07-26 ENCOUNTER — Ambulatory Visit (INDEPENDENT_AMBULATORY_CARE_PROVIDER_SITE_OTHER): Payer: BC Managed Care – PPO | Admitting: Internal Medicine

## 2014-07-26 ENCOUNTER — Encounter: Payer: Self-pay | Admitting: Internal Medicine

## 2014-07-26 VITALS — BP 104/68 | HR 76 | Temp 98.3°F | Resp 14 | Ht 68.0 in | Wt 290.0 lb

## 2014-07-26 DIAGNOSIS — R05 Cough: Secondary | ICD-10-CM

## 2014-07-26 DIAGNOSIS — R059 Cough, unspecified: Secondary | ICD-10-CM

## 2014-07-26 MED ORDER — ALPRAZOLAM 0.25 MG PO TABS: 0.2500 mg | ORAL_TABLET | Freq: Every day | ORAL | Status: DC | PRN

## 2014-07-26 MED ORDER — BENZONATATE 200 MG PO CAPS: 200.0000 mg | ORAL_CAPSULE | Freq: Three times a day (TID) | ORAL | Status: DC | PRN

## 2014-07-26 MED ORDER — LEVALBUTEROL HCL 0.63 MG/3ML IN NEBU: 0.6300 mg | INHALATION_SOLUTION | Freq: Three times a day (TID) | RESPIRATORY_TRACT | Status: DC | PRN

## 2014-07-26 NOTE — Progress Notes (Signed)
Pre-visit discussion using our clinic review tool. No additional management support is needed unless otherwise documented below in the visit note.  

## 2014-07-26 NOTE — Progress Notes (Signed)
Patient ID: Maria Torres, female   DOB: [birth year], 69 y.o.   MRN: [id number]   Patient Active Problem List   Diagnosis Date Noted  . Anemia 10/08/2011    Priority: Low  . Long term current use of anticoagulant therapy 07/13/2014  . Cough 07/13/2014  . History of bicuspid aortic valve 07/13/2014  . S/P aortic valve replacement 07/13/2014  . Esophageal thrush 07/03/2014  . GERD (gastroesophageal reflux disease) 07/03/2014  . Syncope and collapse 11/08/2013  . Acute renal failure 10/28/2013  . Chest pain, unspecified 10/27/2013  . Depression, major, recurrent 09/28/2013  . Knee pain, bilateral 09/28/2013  . Routine general medical examination at a health care facility 07/26/2012  . Obesity, Class III, BMI 40-49.9 (morbid obesity) 11/20/2011  . Post menopausal syndrome 11/20/2011  . Hypothyroidism 10/08/2011  . Benign hypertension 10/08/2011  . Mitral valve disorder 10/08/2011  . Hyperlipidemia 10/08/2011  . OSA on CPAP 10/08/2011    Subjective:  CC:   Chief Complaint  Patient presents with  . Follow-up    4 week    HPI:   Maria Torres is a 69 y.o. female who presents for  Follow up on persistent cough accompanied by wheezing which started over 4 weeks ago.  After last visit patient was referred to pulmonology and seen the following day. Dr Dema Severin,  Stopped the lisinopril and prescribed albuterol to use via  nebulizer, has been using it 3 times daily but not tolerating the systemic effects of tremor and tachycardia. The cough is slowly improving but she contineues to have loss of voice and cough aggravated by any speaking.  She is scheduled to return to work but remains too exhausted and still coughing too much to work in a professional environment.  Dr Dema Severin stopped her tussionex and she has been coughing all night despite taking the tramadol as prescribed.    Past Medical History  Diagnosis Date  . Morbid obesity   . Hx of glaucoma   . Thyroid disease    hypothyrodism  . GERD (gastroesophageal reflux disease)   . Hypertension   . Aortic stenosis     Due to bicuspid aortic valve. 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 at [place]  . 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 Obstructive sleep apnea on CPAP   . Anemia   . Headache(784.0)   . Cervicalgia   . Mitral valve disorder   . Hyperlipidemia   . CHF (congestive heart failure)     Past Surgical History  Procedure Laterality Date  . Cholecystectomy    . Bilateral arthroscopic knee surgery  2001 & 2002  . Gastric banding surgery      @ [place] 04-16-96  . Unilateral total knee replacement  08/2002  . Total left hip replacement  01-20-2007  . 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 Cardiac catheterization  2004    [facility]  . Cardiac catheterization  2008    [facility]       The following portions of the patient's history were reviewed and updated as appropriate: Allergies, current medications, and problem list.    Review of Systems:   Patient denies headache, fevers, malaise, unintentional weight loss, skin rash, eye pain, sinus congestion and sinus pain, sore throat, dysphagia,  hemoptysis , cough, dyspnea, wheezing, chest pain, palpitations, orthopnea, edema, abdominal pain, nausea, melena, diarrhea, constipation, flank pain, dysuria, hematuria, urinary  Frequency, nocturia, numbness, tingling, seizures,  Focal weakness, Loss of consciousness,  Tremor, insomnia, depression, anxiety, and suicidal ideation.     History   Social History  . Marital Status: Single    Spouse Name: N/A    Number of Children: N/A  . Years of Education: N/A   Occupational History  . Not on file.   Social History Main Topics  . Smoking status: Never Smoker   . Smokeless tobacco: Never Used  . Alcohol Use: 0.0 oz/week    0 Not specified per week  . Drug Use: No  . Sexual Activity: Not on file   Other Topics  Concern  . Not on file   Social History Narrative    Objective:  Filed Vitals:   07/26/14 1603  BP: 104/68  Pulse: 76  Temp: 98.3 F (36.8 C)  Resp: 14     General appearance: alert, cooperative and appears stated age.  Coughing every 1-2 minutes , voice very hoarse Ears: normal TM's and external ear canals both ears Throat: lips, mucosa, and tongue normal; teeth and gums normal Neck: no adenopathy, no carotid bruit, supple, symmetrical, trachea midline and thyroid not enlarged, symmetric, no tenderness/mass/nodules Back: symmetric, no curvature. ROM normal. No CVA tenderness. Lungs: clear to auscultation bilaterally.  GAM no wheezing  Heart: regular rate and rhythm, S1, S2 normal, no murmur, click, rub or gallop Abdomen: soft, non-tender; bowel sounds normal; no masses,  no organomegaly Pulses: 2+ and symmetric Skin: Skin color, texture, turgor normal. No rashes or lesions Lymph nodes: Cervical, supraclavicular, and axillary nodes normal.  Assessment and Plan:  Cough Her cough is not suppressed at all on tramadol and she has lost significant sleep because of it,  resuming tussionex for cough suppression,  Informed her that ACE inhibitor induced coughs may take up to 2 months to resolve.  Changing albuterol nebs to xopenex for more beta agonist selectivity given patient report of tremor and tachycardia. Follow up with pulmonology as directed.  FMLA revised to continue to stay out of work for another month,    Updated Medication List Outpatient Encounter Prescriptions as of 07/26/2014  Medication Sig  . albuterol (PROVENTIL HFA;VENTOLIN HFA) 108 (90 BASE) MCG/ACT inhaler Inhale 3 puffs into the lungs 4 (four) times daily.  Marland Kitchen albuterol (PROVENTIL) (2.5 MG/3ML) 0.083% nebulizer solution Take 3 mLs (2.5 mg total) by nebulization every 4 (four) hours as needed for wheezing or shortness of breath.  . ALPRAZolam (XANAX) 0.25 MG tablet Take 1 tablet (0.25 mg total) by mouth daily as  needed for sleep or anxiety.  Marland Kitchen amoxicillin (AMOXIL) 250 MG capsule Take 250 mg by mouth 3 (three) times daily. Before dental procedures  . benzonatate (TESSALON) 200 MG capsule Take 1 capsule (200 mg total) by mouth 3 (three) times daily as needed for cough.  . budesonide-formoterol (SYMBICORT) 160-4.5 MCG/ACT inhaler Inhale 2 puffs into the lungs 2 (two) times daily.  Marland Kitchen buPROPion (WELLBUTRIN XL) 300 MG 24 hr tablet take 1 tablet by mouth once daily  . carvedilol (COREG) 3.125 MG tablet take 1 tablet by mouth every 12 hours  . chlorpheniramine-HYDROcodone (TUSSIONEX PENNKINETIC ER) 10-8 MG/5ML LQCR Take 5 mLs by mouth every 12 (twelve) hours as needed for cough.  . citalopram (CELEXA) 40 MG tablet take 1 tablet by mouth once daily  . hydrocortisone 2.5 % cream Apply topically 2 (two) times daily.  Marland Kitchen levothyroxine (SYNTHROID, LEVOTHROID) 150 MCG tablet Take 1 tablet (150 mcg total) by mouth daily.  Marland Kitchen  losartan (COZAAR) 50 MG tablet Take 1 tablet (50 mg total) by mouth daily.  Marland Kitchen torsemide (DEMADEX) 20 MG tablet Take 20 mg by mouth daily.   . traMADol (ULTRAM) 50 MG tablet Take 1-2 tablets (50-100 mg total) by mouth every 4 (four) hours as needed.  . warfarin (COUMADIN) 10 MG tablet TAKE 1 TABLET BY MOUTH AS DIRECTED  . [DISCONTINUED] ALPRAZolam (XANAX) 0.25 MG tablet Take 1 tablet (0.25 mg total) by mouth daily as needed for sleep or anxiety.  . benzonatate (TESSALON) 200 MG capsule Take 1 capsule (200 mg total) by mouth 3 (three) times daily as needed for cough.  . levalbuterol (XOPENEX) 0.63 MG/3ML nebulizer solution Take 3 mLs (0.63 mg total) by nebulization every 8 (eight) hours as needed for wheezing or shortness of breath.     No orders of the defined types were placed in this encounter.    No Follow-up on file.

## 2014-07-26 NOTE — Patient Instructions (Addendum)
Resume the tussionex for night time cough, and try using the tessalon perles for daytime cough (you can take 2 doses,  Am and afternoon )  Rest your voice as much as possible  Try inhaled humidified warm air, and try lubricating with tea/honey  I am trying to exchange the albuterol for Xopenex for you nebulized solution to decrease the side effects

## 2014-07-28 NOTE — Assessment & Plan Note (Signed)
Her cough is not suppressed at all on tramadol and she has lost significant sleep because of it,  resuming tussionex for cough suppression,  Informed her that ACE inhibitor induced coughs may take up to 2 months to resolve.  Changing albuterol nebs to xopenex for more beta agonist selectivity given patient report of tremor and tachycardia. Follow up with pulmonology as directed.  FMLA revised to continue to stay out of work for another month,

## 2014-08-12 ENCOUNTER — Encounter: Payer: Self-pay | Admitting: Internal Medicine

## 2014-08-12 ENCOUNTER — Telehealth: Payer: Self-pay | Admitting: Internal Medicine

## 2014-08-12 NOTE — Telephone Encounter (Signed)
Patient Needs short term disability, is going to bring form by.

## 2014-08-13 DIAGNOSIS — Z7689 Persons encountering health services in other specified circumstances: Secondary | ICD-10-CM

## 2014-08-16 ENCOUNTER — Ambulatory Visit (INDEPENDENT_AMBULATORY_CARE_PROVIDER_SITE_OTHER): Payer: BC Managed Care – PPO | Admitting: Internal Medicine

## 2014-08-16 ENCOUNTER — Encounter: Payer: Self-pay | Admitting: Internal Medicine

## 2014-08-16 VITALS — BP 132/78 | HR 67 | Temp 98.2°F | Resp 16 | Ht 68.0 in | Wt 303.5 lb

## 2014-08-16 DIAGNOSIS — J040 Acute laryngitis: Secondary | ICD-10-CM

## 2014-08-16 DIAGNOSIS — J383 Other diseases of vocal cords: Secondary | ICD-10-CM

## 2014-08-16 DIAGNOSIS — R059 Cough, unspecified: Secondary | ICD-10-CM

## 2014-08-16 DIAGNOSIS — K219 Gastro-esophageal reflux disease without esophagitis: Secondary | ICD-10-CM

## 2014-08-16 DIAGNOSIS — R05 Cough: Secondary | ICD-10-CM

## 2014-08-16 NOTE — Progress Notes (Signed)
Patient ID: Maria Torres, female   DOB: 1946/05/05, 69 y.o.   MRN: [id number] Patient Active Problem List   Diagnosis Date Noted  . Anemia 10/08/2011    Priority: Low  . Laryngitis from reflux of stomach acid 08/17/2014  . Vocal cord dysfunction 08/17/2014  . Long term current use of anticoagulant therapy 07/13/2014  . Cough 07/13/2014  . History of bicuspid aortic valve 07/13/2014  . S/P aortic valve replacement 07/13/2014  . Esophageal thrush 07/03/2014  . GERD (gastroesophageal reflux disease) 07/03/2014  . Syncope and collapse 11/08/2013  . Acute renal failure 10/28/2013  . Chest pain, unspecified 10/27/2013  . Depression, major, recurrent 09/28/2013  . Knee pain, bilateral 09/28/2013  . Routine general medical examination at a health care facility 07/26/2012  . Obesity, Class III, BMI 40-49.9 (morbid obesity) 11/20/2011  . Post menopausal syndrome 11/20/2011  . Hypothyroidism 10/08/2011  . Benign hypertension 10/08/2011  . Mitral valve disorder 10/08/2011  . Hyperlipidemia 10/08/2011  . OSA on CPAP 10/08/2011    Subjective:  CC:   Chief Complaint  Patient presents with  . Follow-up    FMLA and now disability short term    HPI:   Maria Torres is a 69 y.o. female who presents for   follow up on persistent cough , considered due to ACE Inhibitor, vocal cord dysfunction and reflux. COPD suspected but PFTS have been postponed until after cough resolves.    ENT added zantac   Bid for reflux after doing a laryngoscopy .  Still using the tussionex and the pearls   Requesting short term Disability because she has run out of leave.  Works in [employer],  Has been resting voice as much as possible. .   Wants to return to work part time  For a month in a capacity that does not involve phone work.  Wants to start back on Monday Feb 8th  .    Past Medical History  Diagnosis Date  . Morbid obesity   . Hx of glaucoma   . Thyroid disease     hypothyrodism  .  GERD (gastroesophageal reflux disease)   . Hypertension   . Aortic stenosis     Due to bicuspid aortic valve. 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 at [place]  . 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 Obstructive sleep apnea on CPAP   . Anemia   . Headache(784.0)   . Cervicalgia   . Mitral valve disorder   . Hyperlipidemia   . CHF (congestive heart failure)     Past Surgical History  Procedure Laterality Date  . Cholecystectomy    . Bilateral arthroscopic knee surgery  2001 & 2002  . Gastric banding surgery      @ [place] 04-16-96  . Unilateral total knee replacement  08/2002  . Total left hip replacement  01-20-2007  . 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 Cardiac catheterization  2004    [facility]  . Card[facility]  2008    [facility]       The following portions of the patient's history were reviewed and updated as appropriate: Allergies, current medications, and problem list.    Review of Systems:   Patient denies headache, fevers, malaise, unintentional weight loss, skin rash, eye pain, sinus congestion and sinus pain, sore throat, dysphagia,  hemoptysis , cough, dyspnea, wheezing, chest pain, palpitations, orthopnea,  edema, abdominal pain, nausea, melena, diarrhea, constipation, flank pain, dysuria, hematuria, urinary  Frequency, nocturia, numbness, tingling, seizures,  Focal weakness, Loss of consciousness,  Tremor, insomnia, depression, anxiety, and suicidal ideation.     History   Social History  . Marital Status: Single    Spouse Name: N/A    Number of Children: N/A  . Years of Education: N/A   Occupational History  . Not on file.   Social History Main Topics  . Smoking status: Never Smoker   . Smokeless tobacco: Never Used  . Alcohol Use: 0.0 oz/week    0 Not specified per week  . Drug Use: No  . Sexual Activity: Not on file   Other Topics Concern  . Not on  file   Social History Narrative    Objective:  Filed Vitals:   08/16/14 1635  BP: 132/78  Pulse: 67  Temp: 98.2 F (36.8 C)  Resp: 16     General appearance: alert, cooperative and appears stated age Ears: normal TM's and external ear canals both ears Throat: lips, mucosa, and tongue normal; teeth and gums normal Neck: no adenopathy, no carotid bruit, supple, symmetrical, trachea midline and thyroid not enlarged, symmetric, no tenderness/mass/nodules Back: symmetric, no curvature. ROM normal. No CVA tenderness. Lungs: clear to auscultation bilaterally Heart: regular rate and rhythm, S1, S2 normal, no murmur, click, rub or gallop Abdomen: soft, non-tender; bowel sounds normal; no masses,  no organomegaly Pulses: 2+ and symmetric Skin: Skin color, texture, turgor normal. No rashes or lesions Lymph nodes: Cervical, supraclavicular, and axillary nodes normal.  Assessment and Plan:  Laryngitis from reflux of stomach acid By ENT evaluation since las visit with me,  Now on zantac bid with reported improvement in cough and vocal cord issues.  Needs to refrain from speaking  For 4 weeks to allow voice to heal. Disability forms done.    Cough Multifactorial with ACE Inhibitor, VCD,  Reflux all contributing,  COPd may be a contributor but neeeds PFTs done once cough has resolved,     Vocal cord dysfunction Suggested by recent fiberoptic ENT exam.  Continue zantac bid and cough suppression    Updated Medication List Outpatient Encounter Prescriptions as of 08/16/2014  Medication Sig  . albuterol (PROVENTIL HFA;VENTOLIN HFA) 108 (90 BASE) MCG/ACT inhaler Inhale 3 puffs into the lungs 4 (four) times daily.  Marland Kitchen. albuterol (PROVENTIL) (2.5 MG/3ML) 0.083% nebulizer solution Take 3 mLs (2.5 mg total) by nebulization every 4 (four) hours as needed for wheezing or shortness of breath.  . ALPRAZolam (XANAX) 0.25 MG tablet Take 1 tablet (0.25 mg total) by mouth daily as needed for sleep or  anxiety.  . benzonatate (TESSALON) 200 MG capsule Take 1 capsule (200 mg total) by mouth 3 (three) times daily as needed for cough.  . benzonatate (TESSALON) 200 MG capsule Take 1 capsule (200 mg total) by mouth 3 (three) times daily as needed for cough.  . budesonide-formoterol (SYMBICORT) 160-4.5 MCG/ACT inhaler Inhale 2 puffs into the lungs 2 (two) times daily.  Marland Kitchen. buPROPion (WELLBUTRIN XL) 300 MG 24 hr tablet take 1 tablet by mouth once daily  . carvedilol (COREG) 3.125 MG tablet take 1 tablet by mouth every 12 hours  . chlorpheniramine-HYDROcodone (TUSSIONEX PENNKINETIC ER) 10-8 MG/5ML LQCR Take 5 mLs by mouth every 12 (twelve) hours as needed for cough.  . citalopram (CELEXA) 40 MG tablet take 1 tablet by mouth once daily  . hydrocortisone 2.5 % cream Apply topically 2 (two) times daily.  .Marland Kitchen  levalbuterol (XOPENEX) 0.63 MG/3ML nebulizer solution Take 3 mLs (0.63 mg total) by nebulization every 8 (eight) hours as needed for wheezing or shortness of breath.  . levothyroxine (SYNTHROID, LEVOTHROID) 150 MCG tablet Take 1 tablet (150 mcg total) by mouth daily.  Marland Kitchen losartan (COZAAR) 50 MG tablet Take 1 tablet (50 mg total) by mouth daily.  . ranitidine (ZANTAC) 150 MG capsule Take 150 mg by mouth 2 (two) times daily.  Marland Kitchen torsemide (DEMADEX) 20 MG tablet Take 20 mg by mouth daily.   . traMADol (ULTRAM) 50 MG tablet Take 1-2 tablets (50-100 mg total) by mouth every 4 (four) hours as needed.  . warfarin (COUMADIN) 10 MG tablet TAKE 1 TABLET BY MOUTH AS DIRECTED  . amoxicillin (AMOXIL) 250 MG capsule Take 250 mg by mouth 3 (three) times daily. Before dental procedures  . [DISCONTINUED] levothyroxine (SYNTHROID, LEVOTHROID) 150 MCG tablet Take 1 tablet (150 mcg total) by mouth daily.     No orders of the defined types were placed in this encounter.    No Follow-up on file.

## 2014-08-16 NOTE — Progress Notes (Signed)
Pre-visit discussion using our clinic review tool. No additional management support is needed unless otherwise documented below in the visit note.  

## 2014-08-17 ENCOUNTER — Encounter: Payer: Self-pay | Admitting: Internal Medicine

## 2014-08-17 DIAGNOSIS — J383 Other diseases of vocal cords: Secondary | ICD-10-CM | POA: Insufficient documentation

## 2014-08-17 DIAGNOSIS — J040 Acute laryngitis: Secondary | ICD-10-CM | POA: Insufficient documentation

## 2014-08-17 DIAGNOSIS — K219 Gastro-esophageal reflux disease without esophagitis: Principal | ICD-10-CM

## 2014-08-17 DIAGNOSIS — Z7689 Persons encountering health services in other specified circumstances: Secondary | ICD-10-CM

## 2014-08-17 MED ORDER — LEVOTHYROXINE SODIUM 150 MCG PO TABS: 150.0000 ug | ORAL_TABLET | Freq: Every day | ORAL | Status: DC

## 2014-08-17 NOTE — Assessment & Plan Note (Addendum)
By ENT evaluation since las visit with me,  Now on zantac bid with reported improvement in cough and vocal cord issues.  Needs to refrain from speaking  For 4 weeks to allow voice to heal. Disability forms done.

## 2014-08-17 NOTE — Assessment & Plan Note (Signed)
Multifactorial with ACE Inhibitor, VCD,  Reflux all contributing,  COPd may be a contributor but neeeds PFTs done once cough has resolved,

## 2014-08-17 NOTE — Assessment & Plan Note (Signed)
Suggested by recent fiberoptic ENT exam.  Continue zantac bid and cough suppression

## 2014-08-20 ENCOUNTER — Telehealth: Payer: Self-pay | Admitting: Internal Medicine

## 2014-08-20 NOTE — Telephone Encounter (Signed)
Section 4 of the 2nd Disability form as been completed.  The fee is $20

## 2014-08-20 NOTE — Telephone Encounter (Signed)
Pt notified, mailed back to pt. Copy given for billing, copy to scan

## 2014-08-23 ENCOUNTER — Other Ambulatory Visit: Payer: Self-pay | Admitting: Internal Medicine

## 2014-08-26 ENCOUNTER — Ambulatory Visit: Payer: BC Managed Care – PPO | Admitting: Internal Medicine

## 2014-09-10 DIAGNOSIS — N3946 Mixed incontinence: Secondary | ICD-10-CM | POA: Insufficient documentation

## 2014-09-10 DIAGNOSIS — R159 Full incontinence of feces: Secondary | ICD-10-CM | POA: Insufficient documentation

## 2014-09-10 DIAGNOSIS — R197 Diarrhea, unspecified: Secondary | ICD-10-CM | POA: Insufficient documentation

## 2014-09-22 ENCOUNTER — Ambulatory Visit (INDEPENDENT_AMBULATORY_CARE_PROVIDER_SITE_OTHER): Payer: BC Managed Care – PPO | Admitting: Internal Medicine

## 2014-09-22 VITALS — BP 120/78 | HR 59 | Temp 97.7°F | Ht 68.0 in | Wt 295.8 lb

## 2014-09-22 DIAGNOSIS — K219 Gastro-esophageal reflux disease without esophagitis: Secondary | ICD-10-CM

## 2014-09-22 DIAGNOSIS — J189 Pneumonia, unspecified organism: Secondary | ICD-10-CM

## 2014-09-22 DIAGNOSIS — J040 Acute laryngitis: Secondary | ICD-10-CM

## 2014-09-22 MED ORDER — ALPRAZOLAM 0.25 MG PO TABS: 0.2500 mg | ORAL_TABLET | Freq: Every day | ORAL | Status: DC | PRN

## 2014-09-22 MED ORDER — HYDROCODONE-ACETAMINOPHEN 5-325 MG PO TABS: 1.0000 | ORAL_TABLET | Freq: Four times a day (QID) | ORAL | Status: DC | PRN

## 2014-09-22 NOTE — Progress Notes (Signed)
Pre-visit discussion using our clinic review tool. No additional management support is needed unless otherwise documented below in the visit note.  

## 2014-09-22 NOTE — Progress Notes (Signed)
Patient ID: Maria Torres, female   DOB: 01/07/1946, 69 y.o.   MRN: [id number]   Patient Active Problem List   Diagnosis Date Noted  . Anemia 10/08/2011    Priority: Low  . CAP (community acquired pneumonia) 09/23/2014  . Laryngitis from reflux of stomach acid 08/17/2014  . Vocal cord dysfunction 08/17/2014  . Long term current use of anticoagulant therapy 07/13/2014  . Cough 07/13/2014  . History of bicuspid aortic valve 07/13/2014  . S/P aortic valve replacement 07/13/2014  . Esophageal thrush 07/03/2014  . GERD (gastroesophageal reflux disease) 07/03/2014  . Syncope and collapse 11/08/2013  . Acute renal failure 10/28/2013  . Chest pain, unspecified 10/27/2013  . Depression, major, recurrent 09/28/2013  . Knee pain, bilateral 09/28/2013  . Routine general medical examination at a health care facility 07/26/2012  . Obesity, Class III, BMI 40-49.9 (morbid obesity) 11/20/2011  . Post menopausal syndrome 11/20/2011  . Hypothyroidism 10/08/2011  . Benign hypertension 10/08/2011  . Mitral valve disorder 10/08/2011  . Hyperlipidemia 10/08/2011  . OSA on CPAP 10/08/2011    Subjective:  CC:   Chief Complaint  Patient presents with  . Follow-up    1 month needs paperwork and letter of return to work.    HPI:   Maria Torres is a 69 y.o. female who presents for  Follow up on persistent cough with laryngitis.  She was not allowed to return to work las month because her position required use of voice ove [phone] of time.  Her  Cough has now resolved and her voice has returned.  She is now ready to return to work, but needs a new  physician's statement via company disability forms to be filled out   Obesity:  Jamelle Haring that she is feeling better she has resumed her diet  And has lost 8 lbs since January.  She has not started and exercise program yet.     Past Medical History  Diagnosis Date  . Morbid obesity   . Hx of glaucoma   . Thyroid disease     hypothyrodism  . GERD  (gastroesophageal reflux disease)   . Hypertension   . Aortic stenosis     Due to bicuspid aortic valve. 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 Obstructive sleep apnea on CPAP   . Anemia   . Headache(784.0)   . Cervicalgia   . Mitral valve disorder   . Hyperlipidemia   . CHF (congestive heart failure)     Past Surgical History  Procedure Laterality Date  . Cholecystectomy    . Bilateral arthroscopic knee surgery  2001 & 2002  . Gastric banding surgery      @ [place] 04-16-96  . Unilateral total knee replacement  08/2002  . Total left hip replacement  01-20-2007  . 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 Cardiac catheterization  2004    [facility]  . Cardiac catheterization  2008    [facility]       The following portions of the patient's history were reviewed and updated as appropriate: Allergies, current medications, and problem list.    Review of Systems:   Patient denies headache, fevers, malaise, unintentional weight loss, skin rash, eye pain, sinus congestion and sinus pain, sore throat, dysphagia,  hemoptysis , cough, dyspnea, wheezing, chest pain, palpitations, orthopnea, edema, abdominal  pain, nausea, melena, diarrhea, constipation, flank pain, dysuria, hematuria, urinary  Frequency, nocturia, numbness, tingling, seizures,  Focal weakness, Loss of consciousness,  Tremor, insomnia, depression, anxiety, and suicidal ideation.     History   Social History  . Marital Status: Single    Spouse Name: N/A  . Number of Children: N/A  . Years of Education: N/A   Occupational History  . Not on file.   Social History Main Topics  . Smoking status: Never Smoker   . Smokeless tobacco: Never Used  . Alcohol Use: 0.0 oz/week    0 Standard drinks or equivalent per week  . Drug Use: No  . Sexual Activity: Not on file   Other Topics Concern   . Not on file   Social History Narrative    Objective:  Filed Vitals:   09/22/14 1127  BP: 120/78  Pulse: 59  Temp: 97.7 F (36.5 C)     General appearance: alert, cooperative and appears stated age Ears: normal TM's and external ear canals both ears Throat: lips, mucosa, and tongue normal; teeth and gums normal Neck: no adenopathy, no carotid bruit, supple, symmetrical, trachea midline and thyroid not enlarged, symmetric, no tenderness/mass/nodules Back: symmetric, no curvature. ROM normal. No CVA tenderness. Lungs: clear to auscultation bilaterally Heart: regular rate and rhythm, S1, S2 normal, no murmur, click, rub or gallop Abdomen: soft, non-tender; bowel sounds normal; no masses,  no organomegaly Pulses: 2+ and symmetric Skin: Skin color, texture, turgor normal. No rashes or lesions Lymph nodes: Cervical, supraclavicular, and axillary nodes normal.  Assessment and Plan:  CAP (community acquired pneumonia) She has clinically resolved from the pneumonia that she was hospitalized for in December.    Laryngitis from reflux of stomach acid Her laryngitis has finally resolved with PPI and cough suppressants.  She is ready to return to work.     Updated Medication List Outpatient Encounter Prescriptions as of 09/22/2014  Medication Sig  . albuterol (PROVENTIL HFA;VENTOLIN HFA) 108 (90 BASE) MCG/ACT inhaler Inhale 3 puffs into the lungs 4 (four) times daily.  Marland Kitchen albuterol (PROVENTIL) (2.5 MG/3ML) 0.083% nebulizer solution Take 3 mLs (2.5 mg total) by nebulization every 4 (four) hours as needed for wheezing or shortness of breath.  . ALPRAZolam (XANAX) 0.25 MG tablet Take 1 tablet (0.25 mg total) by mouth daily as needed for sleep or anxiety.  Marland Kitchen amoxicillin (AMOXIL) 250 MG capsule Take 250 mg by mouth 3 (three) times daily. Before dental procedures  . benzonatate (TESSALON) 200 MG capsule Take 1 capsule (200 mg total) by mouth 3 (three) times daily as needed for cough.  .  benzonatate (TESSALON) 200 MG capsule Take 1 capsule (200 mg total) by mouth 3 (three) times daily as needed for cough.  . budesonide-formoterol (SYMBICORT) 160-4.5 MCG/ACT inhaler Inhale 2 puffs into the lungs 2 (two) times daily.  Marland Kitchen buPROPion (WELLBUTRIN XL) 300 MG 24 hr tablet take 1 tablet by mouth once daily  . carvedilol (COREG) 3.125 MG tablet take 1 tablet by mouth every 12 hours  . citalopram (CELEXA) 40 MG tablet take 1 tablet by mouth once daily  . hydrocortisone 2.5 % cream Apply topically 2 (two) times daily.  Marland Kitchen levalbuterol (XOPENEX) 0.63 MG/3ML nebulizer solution Take 3 mLs (0.63 mg total) by nebulization every 8 (eight) hours as needed for wheezing or shortness of breath.  . levothyroxine (SYNTHROID, LEVOTHROID) 150 MCG tablet Take 1 tablet (150 mcg total) by mouth daily.  Marland Kitchen losartan (COZAAR) 50 MG tablet Take 1 tablet (  50 mg total) by mouth daily.  . ranitidine (ZANTAC) 150 MG capsule Take 150 mg by mouth 2 (two) times daily.  Marland Kitchen. torsemide (DEMADEX) 20 MG tablet Take 20 mg by mouth daily.   . traMADol (ULTRAM) 50 MG tablet Take 1-2 tablets (50-100 mg total) by mouth every 4 (four) hours as needed.  . warfarin (COUMADIN) 10 MG tablet TAKE 1 TABLET BY MOUTH AS DIRECTED  . [DISCONTINUED] ALPRAZolam (XANAX) 0.25 MG tablet Take 1 tablet (0.25 mg total) by mouth daily as needed for sleep or anxiety.  Marland Kitchen. HYDROcodone-acetaminophen (NORCO/VICODIN) 5-325 MG per tablet Take 1 tablet by mouth every 6 (six) hours as needed for moderate pain.  . [DISCONTINUED] chlorpheniramine-HYDROcodone (TUSSIONEX PENNKINETIC ER) 10-8 MG/5ML LQCR Take 5 mLs by mouth every 12 (twelve) hours as needed for cough. (Patient not taking: Reported on 09/22/2014)     No orders of the defined types were placed in this encounter.    No Follow-up on file.

## 2014-09-23 ENCOUNTER — Telehealth: Payer: Self-pay | Admitting: Internal Medicine

## 2014-09-23 DIAGNOSIS — J189 Pneumonia, unspecified organism: Secondary | ICD-10-CM | POA: Insufficient documentation

## 2014-09-23 NOTE — Telephone Encounter (Signed)
Disability forms are complete except for address information.  In red folder

## 2014-09-23 NOTE — Assessment & Plan Note (Signed)
She has admitted and treated for pneeumonia and hypoxia and continues to have persistent cough, vocal cord dysfunction and laryngitis.

## 2014-09-24 NOTE — Telephone Encounter (Signed)
Made copies for chart and billing, patient notified paperwork is ready for pick up.

## 2014-09-25 ENCOUNTER — Encounter: Payer: Self-pay | Admitting: Internal Medicine

## 2014-09-25 NOTE — Assessment & Plan Note (Signed)
Her laryngitis has finally resolved with PPI and cough suppressants.  She is ready to return to work.

## 2014-09-25 NOTE — Assessment & Plan Note (Signed)
She has clinically resolved from the pneumonia that she was hospitalized for in December.

## 2014-10-27 DIAGNOSIS — Z09 Encounter for follow-up examination after completed treatment for conditions other than malignant neoplasm: Secondary | ICD-10-CM | POA: Insufficient documentation

## 2014-10-29 DIAGNOSIS — Z96653 Presence of artificial knee joint, bilateral: Secondary | ICD-10-CM | POA: Insufficient documentation

## 2014-11-13 NOTE — Discharge Summary (Signed)
PATIENT NAME:  Maria JarvisREARDON, Maria M MR#:  [id number] DATE OF BIRTH:  05/10/1946  DATE OF ADMISSION:  06/22/2014 DATE OF DISCHARGE:  06/28/2014  ADMISSION DIAGNOSIS: Acute bronchospasm with possible chronic obstructive pulmonary disease.    DISCHARGE DIAGNOSES:   1.  Acute bronchitis with acute bronchospasm.  2.  Acute respiratory failure secondary to community-acquired pneumonia with bronchospasm.  3.  Community acquired pneumonia.  4.  Obstructive sleep apnea.  5.  Essential hypertension.  6.  History of St. Jude aortic valve. 7.  History of anxiety and depression.   CONSULTATIONS: Dory LarsenKurian D. Kasa, MD   DIAGNOSTIC DATA:  Discharge INR is 2.9.   A 2D echocardiogram showed normal ejection fraction, prosthetic aortic valve well seated with gradient consistent with prosthetic valve, mean gradient of 12. There is normal RVSP.   CT of the chest without contrast shows no evidence of a pneumonia. She has bibasilar subsegmental atelectasis and chronic right upper lobe scarring.   Discharge sodium 135, potassium 4.5, chloride 102, bicarb 30, BUN 37, creatinine 1.4, glucose is 119.   Sputum culture with normal flora at 48 hours.   Blood cultures negative to date.   PHYSICAL EXAMINATION AT DISCHARGE:  VITAL SIGNS: Temperature 97.7, pulse 61, respirations 20, blood pressure 101/62, oxygen saturation 93% on room air.  GENERAL: The patient was alert, oriented, not in acute distress, speaking in full sentences. Positive cough.  CARDIOVASCULAR: Regular rate and rhythm. No murmurs, gallops, or rubs. PMI is not displaced.  LUNGS: Wheezing bilaterally with good air movement. No dullness to percussion.  ABDOMEN: Bowel sounds present. Nontender, nondistended. No hepatosplenomegaly.  EXTREMITIES: No clubbing, cyanosis or edema.  HOSPITAL COURSE: This is a very pleasant 69 year old female who was admitted on 06/22/2014 initially. Possible chronic obstructive pulmonary disease exacerbation; however, the  patient does not have a history of chronic obstructive pulmonary disease. She was admitted with shortness of breath, cough, and weakness. For further details, please refer to the H and P.  1.  Acute respiratory failure with hypoxemia secondary to bronchospasm. The patient does not have a diagnosis of COPD. The patient still has some wheezing, but she is not requiring oxygen. She is speaking full sentences. We obtain a pulmonary consultation. CT of the chest without contrast showed no evidence of pneumonia; this has cleared. Dr. Belia HemanKasa recommended increase her steroids and Lasix. Her echocardiogram actually showed normal ejection fraction without any valvular abnormalities, or systolic or diastolic heart failure. She is doing fairly well from her admission, and is stable for discharge with close followup with her primary care physician.  2.  Community acquired pneumonia. The patient's CT chest was negative after treatment. She was on Rocephin and azithromycin, and was treated for 7 days.  3.  OSA. The patient she was on CPAP.  4.  Essential hypertension. The patient was continued on Coreg and lisinopril.  5.  History of St. Jude aortic valve. Goal INR 2.5 to 3.5. INR was 2.9 at discharge.  6.  Anxiety and depression. The patient will continue with Xanax and Wellbutrin.   DISCHARGE MEDICATIONS:  1.  Xanax 0.25 mg t.i.d. p.r.n.  2.  Albuterol 3 puffs 4 times a day.  3.  Benzonate 200 mg t.i.d. p.r.n.  4.  Wellbutrin 300 mg q. 24 hours.  5.  Budesonide formoterol 2 puffs b.i.d.  6.  Coreg 3.125 b.i.d.  7.  Citalopram 40 mg daily.  8.  Dexlansoprazole 60 mg daily.  9.  Tylenol/hydrocodone 325/5 mg q. 6 hours p.r.n.  10.  Hydrocortisone topical b.i.d.  11.  Hyoscyamine 0.125 mg 4 times a day p.r.n.  12.  Levothyroxine 150 mg daily.  13.  Lisinopril 5 mg daily.  14.  Promethazine 12.5 mg q. 8 hours p.r.n.  15.  Topiramate 50 mg b.i.d.  16.  Torsemide 20 mg daily.  17.  Tramadol 50 mg q. 6 hours  p.r.n.  18.  Coumadin 7.5 daily.  19.  Prednisone taper starting at 50 mg, to taper by 10 mg every 2 days.  20.  Guaifenesin 600 mg p.o. every 12 hours.  21.  Albuterol ipratropium nebulizer 4 times a day for wheezing.   DISCHARGE DIET: Regular diet.   DISCHARGE ACTIVITY: As tolerated.   FOLLOWUP: The patient will need to follow up with her primary care physician, Dr. Darrick Huntsman on Friday for an INR/PT.   TIME SPENT: 40 minutes.   The patient was stable for discharge.    ____________________________ Ryken Paschal P. Juliene Pina, MD spm:MT D: 06/28/2014 13:24:41 ET T: 06/28/2014 15:06:09 ET JOB#: [id number]  cc: Billy Rocco P. Juliene Pina, MD, <Dictator> Duncan Dull, MD Janyth Contes Lucynda Rosano MD ELECTRONICALLY SIGNED 06/29/2014 14:07

## 2014-11-13 NOTE — H&P (Signed)
PATIENT NAME:  Sheridan, COHICK MR#:  [id number] DATE OF BIRTH:  April 30, 1946  DATE OF ADMISSION:  06/22/2014  PRIMARY CARE PHYSICIAN: Duncan Dull, MD  CHIEF COMPLAINT: Shortness of breath, cough, and weakness.   HISTORY OF PRESENT ILLNESS: This is a 69 year old female who presents to the hospital complaining of worsening shortness of breath, cough, and weakness over the past week to 10 days. The patient says she called her primary care physician before the holiday last week, who could not see her, but called in a prescription for some prednisone taper and some Levaquin. She has finished the Levaquin and prednisone taper, but her shortness breath, weakness and cough has not improved. She admits to a cough, which is nonproductive in nature. She does admit to a fever, but not documented. She does have chills, but no nausea, no vomiting, no abdominal pain. Positive chest tightness. The patient presented to the ER as her symptoms were not improving, and she had a chest x-ray done which showed a questionable right upper lobe infiltrate. The patient was also noted to have significant wheezing and bronchospasm. Hospitalist services were contacted for further treatment and evaluation.   REVIEW OF SYSTEMS:  CONSTITUTIONAL: No documented fever. No weight gain or weight loss.  EYES: No blurred or double vision.  EARS, NOSE, AND THROAT: No tinnitus. No postnasal drip. No redness of the oropharynx.  RESPIRATORY: Positive cough. Positive wheeze. No hemoptysis. Positive dyspnea on exertion.  CARDIOVASCULAR: Positive chest tightness. No orthopnea. No palpitations. No syncope.  GASTROINTESTINAL: No nausea. No vomiting, diarrhea, no abdominal pain. No melena or hematochezia.  GENITOURINARY: No dysuria or hematuria.  ENDOCRINE: No polyuria or nocturia, or heat or cold intolerance.  HEMATOLOGIC: No anemia, no bruising, no bleeding.  INTEGUMENTARY: No rashes. No lesions.  MUSCULOSKELETAL: No arthritis. No swelling.  No gout.  NEUROLOGIC: No numbness or tingling. No ataxia. No seizure-type activity.  PSYCHIATRIC: Positive anxiety and depression, but no ADD.   PAST MEDICAL HISTORY: Consistent with CHF, obstructive sleep apnea, hypertension, anxiety, history of St. Jude's aortic valve.   ALLERGIES: No known drug allergies.   SOCIAL HISTORY: No smoking. Occasional alcohol use. No illicit drug abuse. Lives at home by herself.   FAMILY HISTORY: Mother and father are both deceased. Mother died from CHF. Father died from testicular cancer.   CURRENT MEDICATIONS: As follows: Xanax 0.25 mg at bedtime as needed for sleep, albuterol inhaler 4 times daily as needed, amoxicillin 250 mg 3 times daily before dental procedures, Tessalon Perles t.i.d. as needed, bupropion 300 mg daily, Symbicort 160/4.5 taken 2 puffs b.i.d., Coreg 3.125 mg b.i.d., Celexa 40 mg daily, Dexilant 60 mg daily, Vicodin 1 tablet q. 6 hours as needed, Hycodan syrup 5 mL q. 6 hours as needed for cough, hydrocortisone cream to be applied b.i.d., hyoscyamine 0.125 mg every 4 hours as needed, Levaquin 5 mg daily, lisinopril 5 mg daily, prednisone taper, promethazine 12.5 mg q. 8 hours as needed, Topamax 50 mg b.i.d., torsemide 20 mg daily, tramadol 50 mg q. 6 hours as needed, warfarin 10 mg daily as directed and zoster vaccine.    PHYSICAL EXAMINATION: Presently is as follows:  VITAL SIGNS: Temperature is 98.7, pulse 76, respirations 22, blood pressure 142/72, saturations 96% on 2 L nasal cannula.  GENERAL: She is a pleasant-appearing female in mild respiratory distress.  HEAD, EYES, EARS, NOSE AND THROAT: Atraumatic, normocephalic. Extraocular muscles are intact. Pupils equal and reactive on to light. Sclerae anicteric. No conjunctival injection. No pharyngeal erythema.  NECK:  Supple. No jugular venous distention. No bruits, no lymphadenopathy, no thyromegaly.  HEART: Regular rate and rhythm, tachycardic. Positive metallic valve click. No rubs. No  murmurs.  LUNGS: Coarse rhonchi and wheezing diffusely. Positive use of accessory muscles. No dullness to percussion.  ABDOMEN: Soft, flat, nontender, nondistended. Has good bowel sounds. No hepatosplenomegaly appreciated.  EXTREMITIES: No evidence of any cyanosis, clubbing, or peripheral edema. Has +2 pedal and radial pulses bilaterally.  NEUROLOGICAL: The patient is alert, awake, and oriented x3 with no focal motor or sensory deficits appreciated bilaterally.  SKIN: Moist and warm, with no rashes appreciated.  LYMPHATIC: There is no cervical or axillary lymphadenopathy.   LABORATORY DATA: Serum glucose 103, BUN 27, creatinine 1.5, sodium 139, potassium 4.2, chloride 103, bicarbonate 29. LFTs are within normal limits. Troponin less than 0.02. White cell count of 13.5, hemoglobin 14.0, hematocrit 43, platelet count 284,000. INR is 2.4.   The patient did have a chest x-ray done, which showed a small area of infiltrate in the anterior segment of the right upper lobe.   ASSESSMENT AND PLAN: This is a 69 year old female with history of congestive heart failure, obstructive sleep apnea, hypertension, anxiety, history of St. Jude aortic valve, who presented to the hospital with shortness of breath, cough, and weakness, and having failed outpatient oral antibiotic therapy.  1.  Chronic obstructive pulmonary disease exacerbation. This is likely the cause of patient's progressive shortness of breath, weakness and cough. The patient has failed outpatient therapy with oral prednisone taper and Levaquin. I will admit the patient and start the patient on IV steroids, around-the-clock nebulizer treatments, continue her Symbicort. We will give her ceftriaxone and Zithromax for underlying pneumonia. Follow blood and sputum cultures. Follow her clinically. Given her antitussives with Tussionex.  2.  Pneumonia. This was likely community-acquired pneumonia, likely right upper lobe pneumonia. We will give her IV  ceftriaxone and Zithromax. Follow sputum and blood cultures.  3.  Hypertension. Presently hemodynamically stable. Continue Coreg and lisinopril.  4.  History of St. Jude's aortic valve. The patient is on Coumadin. INR is therapeutic.  5.  Anxiety/depression. Continue with Xanax and Celexa.   CODE STATUS: The patient is a Full Code.   TIME SPENT: 50 minutes.     ____________________________ Rolly PancakeVivek J. Cherlynn KaiserSainani, MD vjs:MT D: 06/22/2014 17:11:02 ET T: 06/22/2014 17:22:49 ET JOB#: [id number]  cc: Rolly PancakeVivek J. Cherlynn KaiserSainani, MD, <Dictator> Houston SirenVIVEK J Conchetta Lamia MD ELECTRONICALLY SIGNED 06/26/2014 15:58

## 2014-11-15 ENCOUNTER — Other Ambulatory Visit: Payer: Self-pay | Admitting: Internal Medicine

## 2014-11-15 NOTE — Telephone Encounter (Signed)
Patient request ing refill on citalopram ok to fill? 

## 2014-11-16 MED ORDER — CITALOPRAM HYDROBROMIDE 40 MG PO TABS: 40.0000 mg | ORAL_TABLET | Freq: Every day | ORAL | Status: DC

## 2014-11-16 NOTE — Telephone Encounter (Signed)
Ok to refill,  Refill sent  

## 2014-12-24 ENCOUNTER — Ambulatory Visit: Payer: BC Managed Care – PPO | Admitting: Internal Medicine

## 2015-01-25 ENCOUNTER — Other Ambulatory Visit: Payer: Self-pay | Admitting: *Deleted

## 2015-01-27 MED ORDER — LEVOTHYROXINE SODIUM 150 MCG PO TABS: 150.0000 ug | ORAL_TABLET | Freq: Every day | ORAL | Status: DC

## 2015-01-27 NOTE — Telephone Encounter (Signed)
Spoke to pt, lab appt scheduled 02/02/15 for TSH recheck.

## 2015-02-02 ENCOUNTER — Other Ambulatory Visit: Payer: BC Managed Care – PPO

## 2015-03-07 ENCOUNTER — Ambulatory Visit: Payer: Self-pay | Admitting: Internal Medicine

## 2015-03-15 ENCOUNTER — Other Ambulatory Visit: Payer: Self-pay | Admitting: Surgical

## 2015-03-15 MED ORDER — LEVOTHYROXINE SODIUM 150 MCG PO TABS: 150.0000 ug | ORAL_TABLET | Freq: Every day | ORAL | Status: DC

## 2015-03-15 NOTE — Telephone Encounter (Signed)
Fax came in for refill of Levothyroxine 150 mcg. Called patient and scheduled for 03/30/15 @ 3:30 PM and sent in refill to pharmacy

## 2015-03-30 ENCOUNTER — Ambulatory Visit: Payer: BC Managed Care – PPO | Admitting: Internal Medicine

## 2015-04-06 ENCOUNTER — Ambulatory Visit: Payer: BC Managed Care – PPO | Admitting: Internal Medicine

## 2015-04-15 ENCOUNTER — Encounter: Payer: Self-pay | Admitting: Internal Medicine

## 2015-04-15 ENCOUNTER — Ambulatory Visit (INDEPENDENT_AMBULATORY_CARE_PROVIDER_SITE_OTHER): Payer: BC Managed Care – PPO | Admitting: Internal Medicine

## 2015-04-15 VITALS — BP 126/78 | HR 75 | Temp 98.0°F | Resp 14 | Ht 66.0 in | Wt 326.0 lb

## 2015-04-15 DIAGNOSIS — Z23 Encounter for immunization: Secondary | ICD-10-CM | POA: Diagnosis not present

## 2015-04-15 DIAGNOSIS — I10 Essential (primary) hypertension: Secondary | ICD-10-CM

## 2015-04-15 DIAGNOSIS — E785 Hyperlipidemia, unspecified: Secondary | ICD-10-CM

## 2015-04-15 DIAGNOSIS — E039 Hypothyroidism, unspecified: Secondary | ICD-10-CM | POA: Diagnosis not present

## 2015-04-15 DIAGNOSIS — E559 Vitamin D deficiency, unspecified: Secondary | ICD-10-CM

## 2015-04-15 DIAGNOSIS — G4733 Obstructive sleep apnea (adult) (pediatric): Secondary | ICD-10-CM

## 2015-04-15 DIAGNOSIS — M25561 Pain in right knee: Secondary | ICD-10-CM | POA: Diagnosis not present

## 2015-04-15 DIAGNOSIS — F330 Major depressive disorder, recurrent, mild: Secondary | ICD-10-CM

## 2015-04-15 DIAGNOSIS — Z7901 Long term (current) use of anticoagulants: Secondary | ICD-10-CM

## 2015-04-15 DIAGNOSIS — Z9989 Dependence on other enabling machines and devices: Secondary | ICD-10-CM

## 2015-04-15 DIAGNOSIS — E038 Other specified hypothyroidism: Secondary | ICD-10-CM

## 2015-04-15 DIAGNOSIS — M25562 Pain in left knee: Secondary | ICD-10-CM

## 2015-04-15 DIAGNOSIS — E034 Atrophy of thyroid (acquired): Secondary | ICD-10-CM

## 2015-04-15 DIAGNOSIS — E66813 Obesity, class 3: Secondary | ICD-10-CM

## 2015-04-15 LAB — CBC WITH DIFFERENTIAL/PLATELET
Basophils Absolute: 0.1 10*3/uL (ref 0.0–0.1)
Basophils Relative: 1 % (ref 0.0–3.0)
EOS PCT: 4.6 % (ref 0.0–5.0)
Eosinophils Absolute: 0.3 10*3/uL (ref 0.0–0.7)
HEMATOCRIT: 41 % (ref 36.0–46.0)
Hemoglobin: 13.8 g/dL (ref 12.0–15.0)
LYMPHS ABS: 1.2 10*3/uL (ref 0.7–4.0)
LYMPHS PCT: 21.1 % (ref 12.0–46.0)
MCHC: 33.6 g/dL (ref 30.0–36.0)
MCV: 87.3 fl (ref 78.0–100.0)
MONOS PCT: 8.4 % (ref 3.0–12.0)
Monocytes Absolute: 0.5 10*3/uL (ref 0.1–1.0)
NEUTROS PCT: 64.9 % (ref 43.0–77.0)
Neutro Abs: 3.8 10*3/uL (ref 1.4–7.7)
Platelets: 188 10*3/uL (ref 150.0–400.0)
RBC: 4.69 Mil/uL (ref 3.87–5.11)
RDW: 15.2 % (ref 11.5–15.5)
WBC: 5.9 10*3/uL (ref 4.0–10.5)

## 2015-04-15 LAB — VITAMIN D 25 HYDROXY (VIT D DEFICIENCY, FRACTURES): VITD: 23.73 ng/mL — ABNORMAL LOW (ref 30.00–100.00)

## 2015-04-15 LAB — COMPREHENSIVE METABOLIC PANEL
ALT: 11 U/L (ref 0–35)
AST: 14 U/L (ref 0–37)
Albumin: 4.1 g/dL (ref 3.5–5.2)
Alkaline Phosphatase: 53 U/L (ref 39–117)
BILIRUBIN TOTAL: 0.4 mg/dL (ref 0.2–1.2)
BUN: 20 mg/dL (ref 6–23)
CO2: 30 meq/L (ref 19–32)
CREATININE: 1.35 mg/dL — AB (ref 0.40–1.20)
Calcium: 9.2 mg/dL (ref 8.4–10.5)
Chloride: 103 mEq/L (ref 96–112)
GFR: 41.48 mL/min — AB (ref >60.00)
GLUCOSE: 81 mg/dL (ref 70–99)
Potassium: 4.4 mEq/L (ref 3.5–5.1)
Sodium: 139 mEq/L (ref 135–145)
Total Protein: 7.2 g/dL (ref 6.0–8.3)

## 2015-04-15 LAB — LIPID PANEL
CHOL/HDL RATIO: 3
Cholesterol: 229 mg/dL — ABNORMAL HIGH (ref 0–200)
HDL: 70.5 mg/dL (ref >39.00)
LDL CALC: 135 mg/dL — AB (ref 0–99)
NONHDL: 158.09
Triglycerides: 114 mg/dL (ref 0.0–149.0)
VLDL: 22.8 mg/dL (ref 0.0–40.0)

## 2015-04-15 LAB — PROTIME-INR
INR: 2.6 ratio — ABNORMAL HIGH (ref 0.8–1.0)
PROTHROMBIN TIME: 28.4 s — AB (ref 9.6–13.1)

## 2015-04-15 LAB — TSH: TSH: 25.94 u[IU]/mL — ABNORMAL HIGH (ref 0.35–4.50)

## 2015-04-15 MED ORDER — HYDROCODONE-ACETAMINOPHEN 5-325 MG PO TABS: 1.0000 | ORAL_TABLET | Freq: Four times a day (QID) | ORAL | Status: DC | PRN

## 2015-04-15 NOTE — Progress Notes (Signed)
Subjective:  Patient ID: Maria Torres, female    DOB: 08-18-1945  Age: 69 y.o. MRN: [id number]  CC: The primary encounter diagnosis was Long term current use of anticoagulant therapy. Diagnoses of Knee pain, bilateral, Hypothyroidism (acquired), Vitamin D deficiency, Essential hypertension, Hyperlipidemia LDL goal <100, Encounter for immunization, Obesity, Class III, BMI 40-49.9 (morbid obesity), Major depressive disorder, recurrent episode, mild, Benign hypertension, Hypothyroidism due to acquired atrophy of thyroid, Hyperlipidemia, and OSA on CPAP were also pertinent to this visit.  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  She denies shortness of breath and other symptoms of heart failure. She has gained weight due to poor diet and lack of exercise.  She has made a major life decision to retire and move to the [place] , where she will be a few miles from her younger sister.  She plans to retire in late December and has already started the process.  She is very optimistic that beach life and retirement will help motivate her to focus on her health. Her siblings are supportive of her decision.      Outpatient Prescriptions Prior to Visit  Medication Sig Dispense Refill  . albuterol (PROVENTIL HFA;VENTOLIN HFA) 108 (90 BASE) MCG/ACT inhaler Inhale 3 puffs into the lungs 4 (four) times daily.    Marland Kitchen albuterol (PROVENTIL) (2.5 MG/3ML) 0.083% nebulizer solution Take 3 mLs (2.5 mg total) by nebulization every 4 (four) hours as needed for wheezing or shortness of breath. 150 mL 5  . ALPRAZolam (XANAX) 0.25 MG tablet Take 1 tablet (0.25 mg total) by mouth daily as needed for sleep or anxiety. 30 tablet 3  . amoxicillin (AMOXIL) 250 MG capsule Take 250 mg by mouth 3 (three) times daily. Before dental procedures    . benzonatate (TESSALON)  200 MG capsule Take 1 capsule (200 mg total) by mouth 3 (three) times daily as needed for cough. 60 capsule 1  . benzonatate (TESSALON) 200 MG capsule Take 1 capsule (200 mg total) by mouth 3 (three) times daily as needed for cough. 60 capsule 2  . budesonide-formoterol (SYMBICORT) 160-4.5 MCG/ACT inhaler Inhale 2 puffs into the lungs 2 (two) times daily. 1 Inhaler 12  . buPROPion (WELLBUTRIN XL) 300 MG 24 hr tablet take 1 tablet by mouth once daily 90 tablet 1  . carvedilol (COREG) 3.125 MG tablet take 1 tablet by mouth every 12 hours 60 tablet 5  . citalopram (CELEXA) 40 MG tablet Take 1 tablet (40 mg total) by mouth daily. 30 tablet 5  . hydrocortisone 2.5 % cream Apply topically 2 (two) times daily. 30 g 0  . levalbuterol (XOPENEX) 0.63 MG/3ML nebulizer solution Take 3 mLs (0.63 mg total) by nebulization every 8 (eight) hours as needed for wheezing or shortness of breath. 90 mL 1  . losartan (COZAAR) 50 MG tablet Take 1 tablet (50 mg total) by mouth daily. 90 tablet 3  . ranitidine (ZANTAC) 150 MG capsule Take 150 mg by mouth 2 (two) times daily.    Marland Kitchen torsemide (DEMADEX) 20 MG tablet Take 20 mg by mouth daily.     . traMADol (ULTRAM) 50 MG tablet Take 1-2 tablets (50-100 mg total) by mouth every 4 (four) hours as needed. 40 tablet 0  . warfarin (COUMADIN) 10 MG tablet TAKE 1 TABLET BY MOUTH AS DIRECTED 60 tablet  5  . HYDROcodone-acetaminophen (NORCO/VICODIN) 5-325 MG per tablet Take 1 tablet by mouth every 6 (six) hours as needed for moderate pain. 60 tablet 0  . levothyroxine (SYNTHROID, LEVOTHROID) 150 MCG tablet Take 1 tablet (150 mcg total) by mouth daily. 15 tablet 0   No facility-administered medications prior to visit.    Review of Systems;  Patient denies headache, fevers, malaise, unintentional weight loss, skin rash, eye pain, sinus congestion and sinus pain, sore throat, dysphagia,  hemoptysis , cough, dyspnea, wheezing, chest pain, palpitations, orthopnea, edema, abdominal pain,  nausea, melena, diarrhea, constipation, flank pain, dysuria, hematuria, urinary  Frequency, nocturia, numbness, tingling, seizures,  Focal weakness, Loss of consciousness,  Tremor, insomnia, depression, anxiety, and suicidal ideation.      Objective:  BP 126/78 mmHg  Pulse 75  Temp(Src) 98 F (36.7 C) (Oral)  Resp 14  Ht 5\' 6"  (1.676 m)  Wt 326 lb (147.873 kg)  BMI 52.64 kg/m2  SpO2 96%  LMP 08/31/2013  BP Readings from Last 3 Encounters:  04/15/15 126/78  09/22/14 120/78  08/16/14 132/78    Wt Readings from Last 3 Encounters:  04/15/15 326 lb (147.873 kg)  09/22/14 295 lb 12 oz (134.151 kg)  08/16/14 303 lb 8 oz (137.667 kg)    General appearance: alert, cooperative and appears stated age Neck: no adenopathy, no carotid bruit, supple, symmetrical, trachea midline and thyroid not enlarged, symmetric, no tenderness/mass/nodules Back: symmetric, no curvature. ROM normal. No CVA tenderness. Lungs: clear to auscultation bilaterally Heart: regular rate and rhythm, S1, S2 normal, no murmur, click, rub or gallop Abdomen: soft, non-tender; bowel sounds normal; no masses,  no organomegaly Pulses: 2+ and symmetric Skin: Skin color, texture, turgor normal. No rashes or lesions Lymph nodes: Cervical, supraclavicular, and axillary nodes normal.  No results found for: HGBA1C  Lab Results  Component Value Date   CREATININE 1.35* 04/15/2015   CREATININE 1.26 07/07/2014   CREATININE 1.4* 06/29/2014    Lab Results  Component Value Date   WBC 5.9 04/15/2015   HGB 13.8 04/15/2015   HCT 41.0 04/15/2015   PLT 188.0 04/15/2015   GLUCOSE 81 04/15/2015   CHOL 229* 04/15/2015   TRIG 114.0 04/15/2015   HDL 70.50 04/15/2015   LDLDIRECT 122.8 11/20/2011   LDLCALC 135* 04/15/2015   ALT 11 04/15/2015   AST 14 04/15/2015   NA 139 04/15/2015   K 4.4 04/15/2015   CL 103 04/15/2015   CREATININE 1.35* 04/15/2015   BUN 20 04/15/2015   CO2 30 04/15/2015   TSH 25.94* 04/15/2015   INR  2.6* 04/15/2015    No results found.  Assessment & Plan:   Problem List Items Addressed This Visit      Unprioritized   Hypothyroidism    Thyroid function is underactive on current dose of 150 mcg daily .  Will increase dose to 175 mcg  given her significant  weight gain  And repeat TSH in 6 weeks.   Lab Results  Component Value Date   TSH 25.94* 04/15/2015         Relevant Medications   levothyroxine (SYNTHROID, LEVOTHROID) 175 MCG tablet   Benign hypertension    Well controlled on current regimen. Renal function stable, no changes today.  Lab Results  Component Value Date   CREATININE 1.35* 04/15/2015   Lab Results  Component Value Date   NA 139 04/15/2015   K 4.4 04/15/2015   CL 103 04/15/2015   CO2 30 04/15/2015  Hyperlipidemia    Based on current lipid profile, the risk of clinically significant CAD is 11% over the next 10 years, using the Framingham risk calculator. The [employer] recommends starting patients aged 76 or higher on moderate intensity statin therapy for LDL between 70-189 and 10 yr risk of CAD > 7.5% ;  Given her underactive thyroid, will repeat when thyroid is functioning normally.   Lab Results  Component Value Date   CHOL 229* 04/15/2015   HDL 70.50 04/15/2015   LDLCALC 135* 04/15/2015   LDLDIRECT 122.8 11/20/2011   TRIG 114.0 04/15/2015   CHOLHDL 3 04/15/2015         OSA on CPAP    Diagnosed by sleep study. She is wearing her CPAP every night a minimum of 6 hours per night and notes improved daytime wakefulness and decreased fatigue        Obesity, Class III, BMI 40-49.9 (morbid obesity)    I have reviewed her weight gain of 30 lbs since her last visit  In March and  encouraged  A goal of 10% of body weight over the next 6 months using a low glycemic index diet and regular exercise a minimum of 5 days per week.    Body mass index is 52.64 kg/(m^2).         Depression, major, recurrent    Triggered   by  breakup by life partner last year.  complicated by financial stressors.  She is stable on wellbutrin and citalopram . Prn alprazolam #30 refilled for anxiety and insmonia. She is not and never has been suicidal. The risks and benefits of benzodiazepine use were reviewed with patient today including excessive sedation leading to respiratory depression,  impaired thinking/driving, and addiction.  Patient was advised to avoid concurrent use with alcohol, to use medication only as needed and not to share with others  .         Long term current use of anticoagulant therapy - Primary    Dose is 10 mg daily and her last inr  Was 1.5 in August, with no change in therapy advised by her cardiologist given her report of several missed doses. INR today is at goal ,  No changes. .   Lab Results  Component Value Date   INR 2.6* 04/15/2015   INR 1.6 07/07/2014   INR 2.9 06/28/2014   PROTIME 23.0* 04/10/2011         Relevant Orders   CBC with Differential/Platelet (Completed)   INR/PT (Completed)   Knee pain, bilateral    Managed with prn vicodin not daily        Other Visit Diagnoses    Hypothyroidism (acquired)        Relevant Medications    levothyroxine (SYNTHROID, LEVOTHROID) 175 MCG tablet    Other Relevant Orders    TSH (Completed)    Vitamin D deficiency        Relevant Orders    Vit D  25 hydroxy (rtn osteoporosis monitoring) (Completed)    Essential hypertension        Relevant Orders    Comprehensive metabolic panel (Completed)    Hyperlipidemia LDL goal <100        Relevant Orders    Lipid panel (Completed)    Encounter for immunization           I have changed Ms. Kafer's levothyroxine. I am also having her maintain her amoxicillin, albuterol, budesonide-formoterol, torsemide, hydrocortisone, warfarin, carvedilol, benzonatate, traMADol, albuterol, losartan,  levalbuterol, benzonatate, ranitidine, buPROPion, ALPRAZolam, citalopram, and  HYDROcodone-acetaminophen.  Meds ordered this encounter  Medications  . HYDROcodone-acetaminophen (NORCO/VICODIN) 5-325 MG per tablet    Sig: Take 1 tablet by mouth every 6 (six) hours as needed for moderate pain.    Dispense:  90 tablet    Refill:  0  . levothyroxine (SYNTHROID, LEVOTHROID) 175 MCG tablet    Sig: Take 1 tablet (175 mcg total) by mouth daily.    Dispense:  90 tablet    Refill:  1   A total of 40 minutes was spent with patient more than half of which was spent in counseling patient on the above mentioned issues , reviewing and explaining recent labs and imaging studies done, and coordination of care.  Medications Discontinued During This Encounter  Medication Reason  . HYDROcodone-acetaminophen (NORCO/VICODIN) 5-325 MG per tablet Reorder  . levothyroxine (SYNTHROID, LEVOTHROID) 150 MCG tablet Reorder    Follow-up: Return in about 3 months (around 07/15/2015).   Sherlene Shams, MD

## 2015-04-15 NOTE — Assessment & Plan Note (Addendum)
Dose is 10 mg daily and her last inr  Was 1.5 in August, with no change in therapy advised by her cardiologist given her report of several missed doses. INR today is at goal ,  No changes. .   Lab Results  Component Value Date   INR 2.6* 04/15/2015   INR 1.6 07/07/2014   INR 2.9 06/28/2014   PROTIME 23.0* 04/10/2011

## 2015-04-15 NOTE — Assessment & Plan Note (Signed)
Managed with prn vicodin not daily

## 2015-04-15 NOTE — Progress Notes (Signed)
Pre-visit discussion using our clinic review tool. No additional management support is needed unless otherwise documented below in the visit note.  

## 2015-04-15 NOTE — Patient Instructions (Addendum)
I'll see you in December for your annual   I will sen yo a Clinical cytogeneticist message about your labs

## 2015-04-17 ENCOUNTER — Telehealth: Payer: Self-pay | Admitting: Internal Medicine

## 2015-04-17 DIAGNOSIS — Z7901 Long term (current) use of anticoagulants: Secondary | ICD-10-CM

## 2015-04-17 DIAGNOSIS — E034 Atrophy of thyroid (acquired): Secondary | ICD-10-CM

## 2015-04-17 DIAGNOSIS — E785 Hyperlipidemia, unspecified: Secondary | ICD-10-CM

## 2015-04-17 MED ORDER — LEVOTHYROXINE SODIUM 175 MCG PO TABS: 175.0000 ug | ORAL_TABLET | Freq: Every day | ORAL | Status: DC

## 2015-04-17 NOTE — Assessment & Plan Note (Signed)
Based on current lipid profile, the risk of clinically significant CAD is 11% over the next 10 years, using the Framingham risk calculator. The [employer] recommends starting patients aged 69 or higher on moderate intensity statin therapy for LDL between 70-189 and 10 yr risk of CAD > 7.5% ;  Given her underactive thyroid, will repeat when thyroid is functioning normally.   Lab Results  Component Value Date   CHOL 229* 04/15/2015   HDL 70.50 04/15/2015   LDLCALC 135* 04/15/2015   LDLDIRECT 122.8 11/20/2011   TRIG 114.0 04/15/2015   CHOLHDL 3 04/15/2015

## 2015-04-17 NOTE — Assessment & Plan Note (Signed)
Diagnosed by sleep study. She is wearing her CPAP every night a minimum of 6 hours per night and notes improved daytime wakefulness and decreased fatigue  

## 2015-04-17 NOTE — Assessment & Plan Note (Addendum)
Thyroid function is underactive on current dose of 150 mcg daily .  Will increase dose to 175 mcg  given her significant  weight gain  And repeat TSH in 6 weeks.   Lab Results  Component Value Date   TSH 25.94* 04/15/2015

## 2015-04-17 NOTE — Assessment & Plan Note (Signed)
I have reviewed her weight gain of 30 lbs since her last visit  In March and  encouraged  A goal of 10% of body weight over the next 6 months using a low glycemic index diet and regular exercise a minimum of 5 days per week.    Body mass index is 52.64 kg/(m^2).

## 2015-04-17 NOTE — Assessment & Plan Note (Signed)
Triggered  by  breakup by life partner last year.  complicated by financial stressors.  She is stable on wellbutrin and citalopram . Prn alprazolam #30 refilled for anxiety and insmonia. She is not and never has been suicidal. The risks and benefits of benzodiazepine use were reviewed with patient today including excessive sedation leading to respiratory depression,  impaired thinking/driving, and addiction.  Patient was advised to avoid concurrent use with alcohol, to use medication only as needed and not to share with others  .

## 2015-04-17 NOTE — Assessment & Plan Note (Signed)
Well controlled on current regimen. Renal function stable, no changes today.  Lab Results  Component Value Date   CREATININE 1.35* 04/15/2015   Lab Results  Component Value Date   NA 139 04/15/2015   K 4.4 04/15/2015   CL 103 04/15/2015   CO2 30 04/15/2015

## 2015-04-17 NOTE — Telephone Encounter (Signed)
MyChart message sent re labs.  Thyroid dose increased.  Can you call her to confirm she received it?

## 2015-04-19 ENCOUNTER — Other Ambulatory Visit: Payer: Self-pay

## 2015-04-19 MED ORDER — CARVEDILOL 3.125 MG PO TABS: 3.1250 mg | ORAL_TABLET | Freq: Two times a day (BID) | ORAL | Status: DC

## 2015-04-19 NOTE — Telephone Encounter (Signed)
Faxed form to patient

## 2015-05-02 ENCOUNTER — Encounter: Payer: Self-pay | Admitting: Internal Medicine

## 2015-05-03 MED ORDER — HYDROCORTISONE 2.5 % EX CREA: TOPICAL_CREAM | Freq: Two times a day (BID) | CUTANEOUS | Status: DC

## 2015-05-11 ENCOUNTER — Ambulatory Visit (INDEPENDENT_AMBULATORY_CARE_PROVIDER_SITE_OTHER): Payer: BC Managed Care – PPO | Admitting: Nurse Practitioner

## 2015-05-11 VITALS — BP 142/80 | HR 74 | Temp 98.1°F | Resp 14 | Ht 66.0 in | Wt 332.2 lb

## 2015-05-11 DIAGNOSIS — I8311 Varicose veins of right lower extremity with inflammation: Secondary | ICD-10-CM

## 2015-05-11 DIAGNOSIS — I8312 Varicose veins of left lower extremity with inflammation: Secondary | ICD-10-CM

## 2015-05-11 DIAGNOSIS — I872 Venous insufficiency (chronic) (peripheral): Secondary | ICD-10-CM

## 2015-05-11 MED ORDER — TRIAMCINOLONE ACETONIDE 0.1 % EX CREA: 1.0000 "application " | TOPICAL_CREAM | Freq: Two times a day (BID) | CUTANEOUS | Status: DC

## 2015-05-11 NOTE — Progress Notes (Signed)
Pre visit review using our clinic review tool, if applicable. No additional management support is needed unless otherwise documented below in the visit note. 

## 2015-05-11 NOTE — Patient Instructions (Signed)
Eucerin Cream- jar or bottle after the triamcinolone cream twice daily.

## 2015-05-11 NOTE — Progress Notes (Signed)
Patient ID: Maria Torres, female    DOB: February 08, 1946  Age: 69 y.o. MRN: [id number]  CC: Rash   HPI Maria Torres presents for CC of rash x 1 month ago.   1) Rash x 1 month front of legs, pruritic, the patient reports that she picked it up sometime in [place].  Hydrocortisone not helpful for long-term.  History Maria Torres has a past medical history of Morbid obesity; glaucoma; Thyroid disease; GERD (gastroesophageal reflux disease); Hypertension; Aortic stenosis; Arthritis; Obstructive sleep apnea on CPAP; Anemia; Headache(784.0); Cervicalgia; Mitral valve disorder; Hyperlipidemia; and CHF (congestive heart failure).   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   Her family history includes COPD in her mother; Cancer in her brother, father, and sister; Coronary artery disease in her mother; Heart disease (age of onset: 66) in her mother; Hyperlipidemia in her mother; Obesity in her sister; Pulmonary embolism in her father.She reports that she has never smoked. She has never used smokeless tobacco. She reports that she drinks alcohol. She reports that she does not use illicit drugs.  Outpatient Prescriptions Prior to Visit  Medication Sig Dispense Refill  . albuterol (PROVENTIL HFA;VENTOLIN HFA) 108 (90 BASE) MCG/ACT inhaler Inhale 3 puffs into the lungs 4 (four) times daily.    Marland Kitchen albuterol (PROVENTIL) (2.5 MG/3ML) 0.083% nebulizer solution Take 3 mLs (2.5 mg total) by nebulization every 4 (four) hours as needed for wheezing or shortness of breath. 150 mL 5  . ALPRAZolam (XANAX) 0.25 MG tablet Take 1 tablet (0.25 mg total) by mouth daily as needed for sleep or anxiety. 30 tablet  3  . amoxicillin (AMOXIL) 250 MG capsule Take 250 mg by mouth 3 (three) times daily. Before dental procedures    . benzonatate (TESSALON) 200 MG capsule Take 1 capsule (200 mg total) by mouth 3 (three) times daily as needed for cough. 60 capsule 1  . benzonatate (TESSALON) 200 MG capsule Take 1 capsule (200 mg total) by mouth 3 (three) times daily as needed for cough. 60 capsule 2  . budesonide-formoterol (SYMBICORT) 160-4.5 MCG/ACT inhaler Inhale 2 puffs into the lungs 2 (two) times daily. 1 Inhaler 12  . buPROPion (WELLBUTRIN XL) 300 MG 24 hr tablet take 1 tablet by mouth once daily 90 tablet 1  . carvedilol (COREG) 3.125 MG tablet Take 1 tablet (3.125 mg total) by mouth every 12 (twelve) hours. 60 tablet 5  . citalopram (CELEXA) 40 MG tablet Take 1 tablet (40 mg total) by mouth daily. 30 tablet 5  . HYDROcodone-acetaminophen (NORCO/VICODIN) 5-325 MG per tablet Take 1 tablet by mouth every 6 (six) hours as needed for moderate pain. 90 tablet 0  . hydrocortisone 2.5 % cream Apply topically 2 (two) times daily. 30 g 0  . levalbuterol (XOPENEX) 0.63 MG/3ML nebulizer solution Take 3 mLs (0.63 mg total) by nebulization every 8 (eight) hours as needed for wheezing or shortness of breath. 90 mL 1  . levothyroxine (SYNTHROID, LEVOTHROID) 175 MCG tablet Take 1 tablet (175 mcg total) by mouth daily. 90 tablet 1  . losartan (COZAAR) 50 MG tablet Take 1 tablet (50 mg total) by mouth daily. 90 tablet 3  . ranitidine (ZANTAC) 150 MG capsule Take 150 mg by mouth 2 (two) times daily.    Marland Kitchen torsemide (DEMADEX) 20 MG  tablet Take 20 mg by mouth daily.     . traMADol (ULTRAM) 50 MG tablet Take 1-2 tablets (50-100 mg total) by mouth every 4 (four) hours as needed. 40 tablet 0  . warfarin (COUMADIN) 10 MG tablet TAKE 1 TABLET BY MOUTH AS DIRECTED 60 tablet 5   No facility-administered medications prior to visit.    ROS Review of Systems  Constitutional: Negative for fever, chills, diaphoresis and fatigue.    Respiratory: Negative for chest tightness, shortness of breath and wheezing.   Cardiovascular: Negative for chest pain, palpitations and leg swelling.  Gastrointestinal: Negative for nausea, vomiting and diarrhea.  Skin: Positive for rash.    Objective:  BP 142/80 mmHg  Pulse 74  Temp(Src) 98.1 F (36.7 C)  Resp 14  Ht 5\' 6"  (1.676 m)  Wt 332 lb 3.2 oz (150.685 kg)  BMI 53.64 kg/m2  SpO2 96%  LMP 08/31/2013  Physical Exam  Constitutional: She is oriented to person, place, and time. She appears well-developed and well-nourished. No distress.  HENT:  Head: Normocephalic and atraumatic.  Right Ear: External ear normal.  Left Ear: External ear normal.  Neurological: She is alert and oriented to person, place, and time. No cranial nerve deficit. She exhibits normal muscle tone. Coordination normal.  Skin: Skin is warm and dry. Rash noted. She is not diaphoretic.     Patient has an erythematous and hyperpigmented areas of the lower extremities from the knees to the ankles.  Psychiatric: She has a normal mood and affect. Her behavior is normal. Judgment and thought content normal.   Assessment & Plan:   Maria Torres was seen today for rash.  Diagnoses and all orders for this visit:  Obesity, Class III, BMI 40-49.9 (morbid obesity) (HCC)  Venous stasis dermatitis of both lower extremities  Other orders -     triamcinolone cream (KENALOG) 0.1 %; Apply 1 application topically 2 (two) times daily.   I am having Maria Torres start on triamcinolone cream. I am also having her maintain her amoxicillin, albuterol, budesonide-formoterol, torsemide, warfarin, benzonatate, traMADol, albuterol, losartan, levalbuterol, benzonatate, ranitidine, buPROPion, ALPRAZolam, citalopram, HYDROcodone-acetaminophen, levothyroxine, carvedilol, and hydrocortisone.  Meds ordered this encounter  Medications  . triamcinolone cream (KENALOG) 0.1 %    Sig: Apply 1 application topically 2 (two) times daily.     Dispense:  80 g    Refill:  1    Order Specific Question:  Supervising Provider    Answer:  Sherlene ShamsULLO, TERESA L [[id number]]     Follow-up: No Follow-up on file.

## 2015-05-18 ENCOUNTER — Encounter: Payer: Self-pay | Admitting: Nurse Practitioner

## 2015-05-18 DIAGNOSIS — I872 Venous insufficiency (chronic) (peripheral): Secondary | ICD-10-CM | POA: Insufficient documentation

## 2015-05-18 NOTE — Assessment & Plan Note (Signed)
Wt Readings from Last 3 Encounters:  05/11/15 332 lb 3.2 oz (150.685 kg)  04/15/15 326 lb (147.873 kg)  09/22/14 295 lb 12 oz (134.151 kg)   Patient is still gaining weight despite encouragement from Dr. to low in September. Encouraged her to work on a healthy lifestyle.

## 2015-05-18 NOTE — Assessment & Plan Note (Signed)
We'll try triamcinolone 0.1% mixed with Eucerin. Apply twice daily to lower extremities and follow-up if not working after about 2 weeks. Advised elevating legs is much as possible, drinking water, and wearing compression hose if able.

## 2015-05-21 ENCOUNTER — Other Ambulatory Visit: Payer: Self-pay | Admitting: Internal Medicine

## 2015-07-14 ENCOUNTER — Ambulatory Visit: Payer: BC Managed Care – PPO | Admitting: Internal Medicine

## 2015-07-21 ENCOUNTER — Other Ambulatory Visit: Payer: Self-pay

## 2015-07-21 MED ORDER — CITALOPRAM HYDROBROMIDE 40 MG PO TABS: 40.0000 mg | ORAL_TABLET | Freq: Every day | ORAL | Status: DC

## 2015-08-05 ENCOUNTER — Ambulatory Visit: Payer: BC Managed Care – PPO | Admitting: Internal Medicine

## 2015-08-25 ENCOUNTER — Ambulatory Visit: Payer: BC Managed Care – PPO | Admitting: Internal Medicine

## 2015-09-03 IMAGING — CT CT CHEST W/O CM
2 of 3 series · 15 of 36 positions shown, 18 images · non-contrast
Comparison: 07/15/2012

CLINICAL DATA: Worsening acute wheezing and cough, increased
shortness of breath, weakness. History of CHF, coronary bypass, and
aortic valve replacement.

EXAM:
CT CHEST WITHOUT CONTRAST
TECHNIQUE: Multidetector CT imaging of the chest was performed following the
standard protocol without IV contrast..

[Series 2: routine chest wo · axial · 0.73mm/px · z∈[-712,-467]mm · 12 of 59 slices shown, 15 images]
[im 5/59  mediastinal]
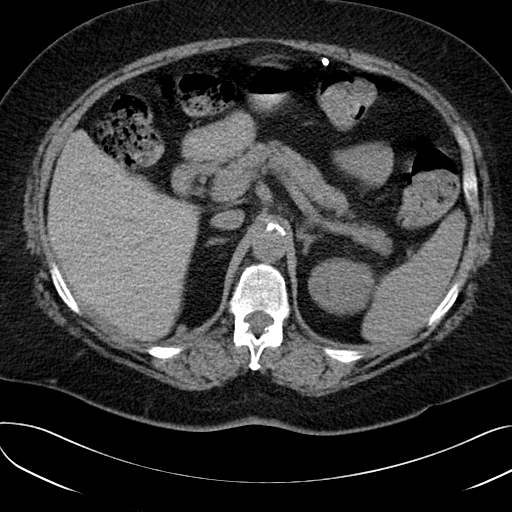
[im 5/59  lung]
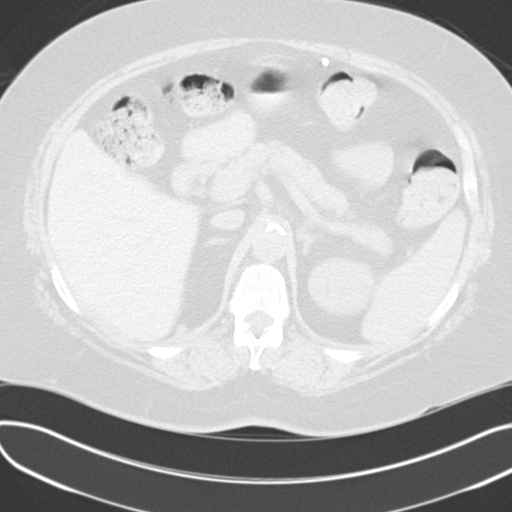
[im 9/59  lung]
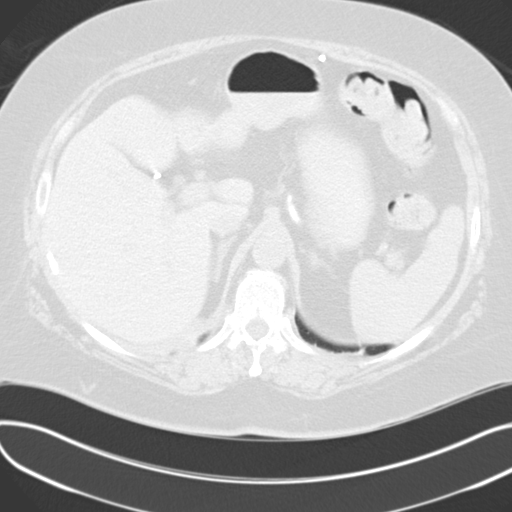
[im 13/59  lung]
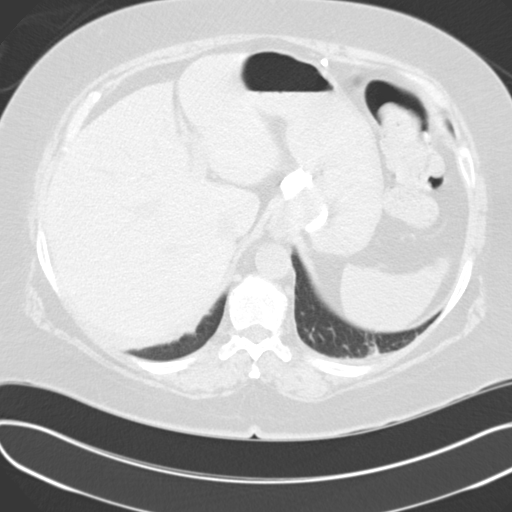
[im 18/59  lung]
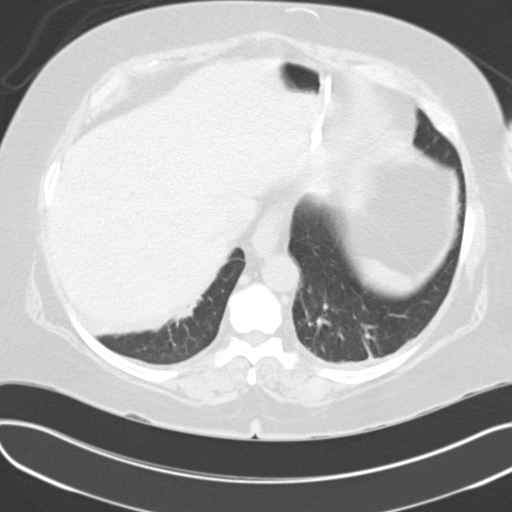
[im 22/59  mediastinal]
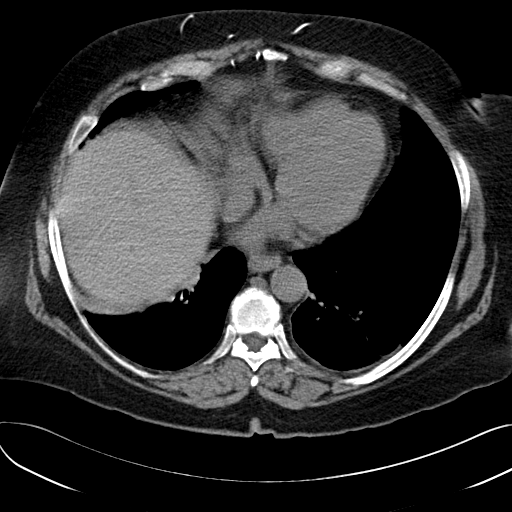
[im 22/59  lung]
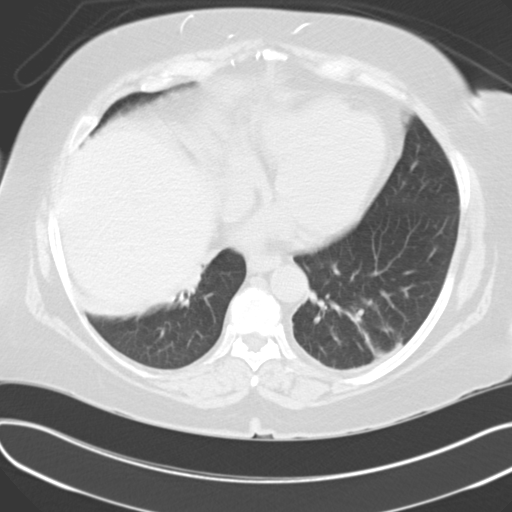
[im 26/59  lung]
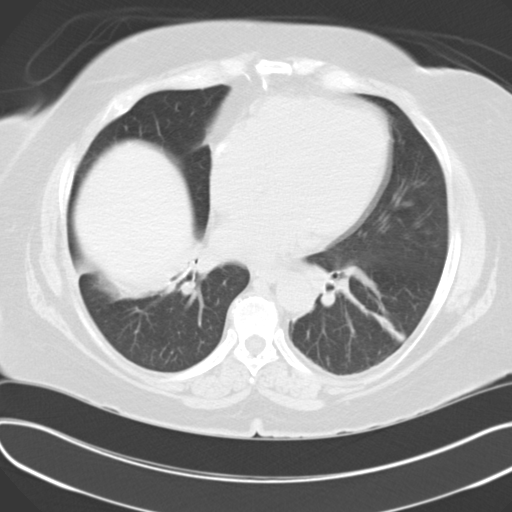
[im 33/59  lung]
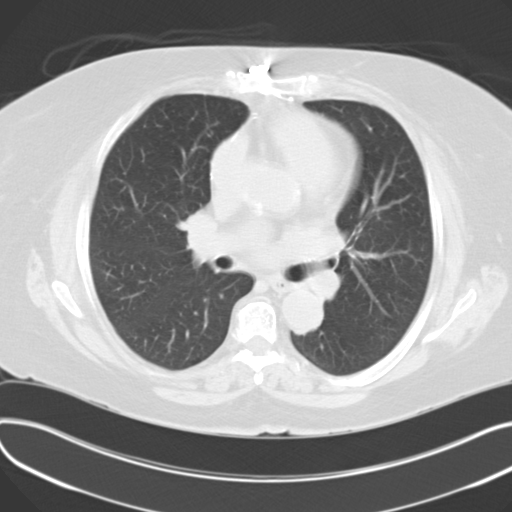
[im 37/59  lung]
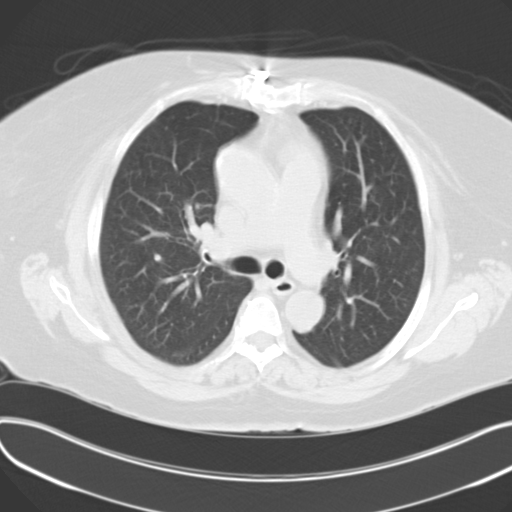
[im 41/59  mediastinal]
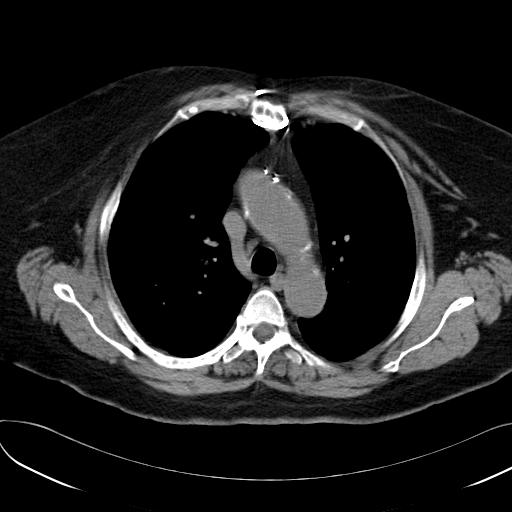
[im 41/59  lung]
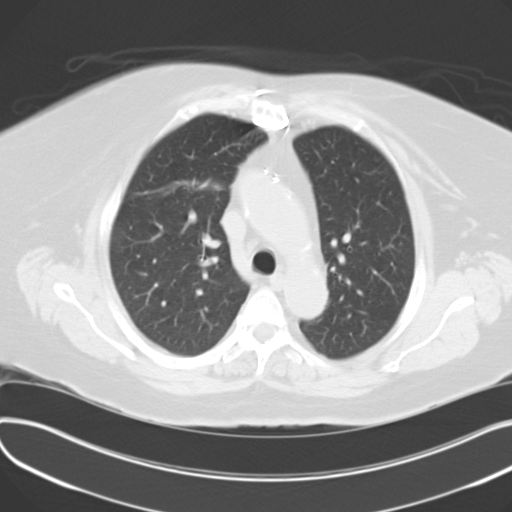
[im 46/59  lung]
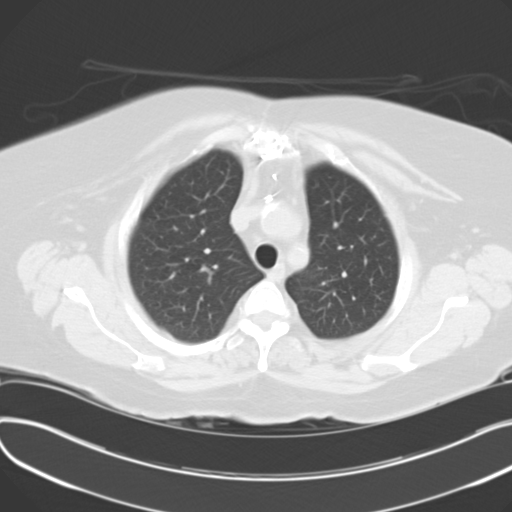
[im 50/59  lung]
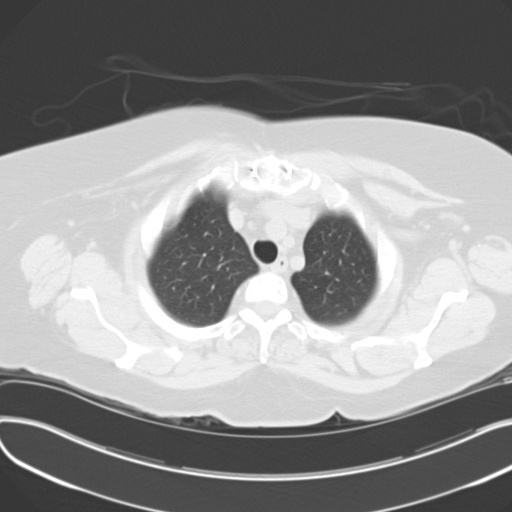
[im 54/59  lung]
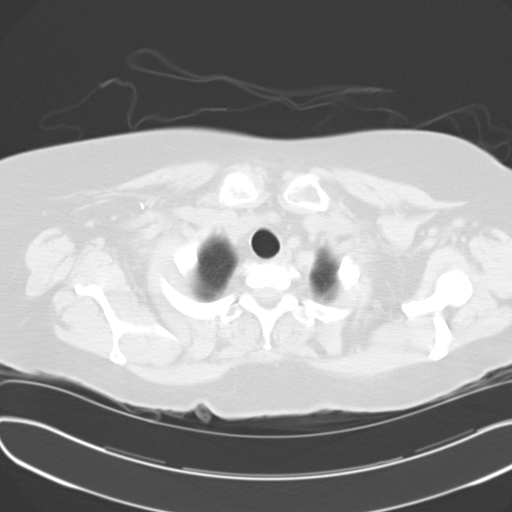

[Series 5: cor routine chest wo · coronal · 0.60mm/px · 3 of 146 slices shown]
[im 30/146  lung]
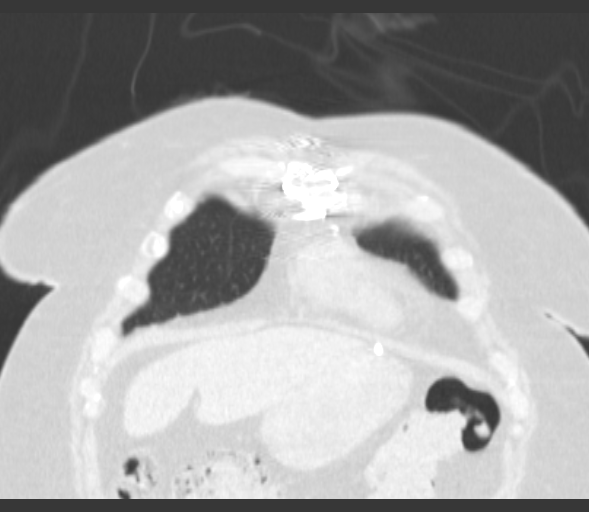
[im 59/146  lung]
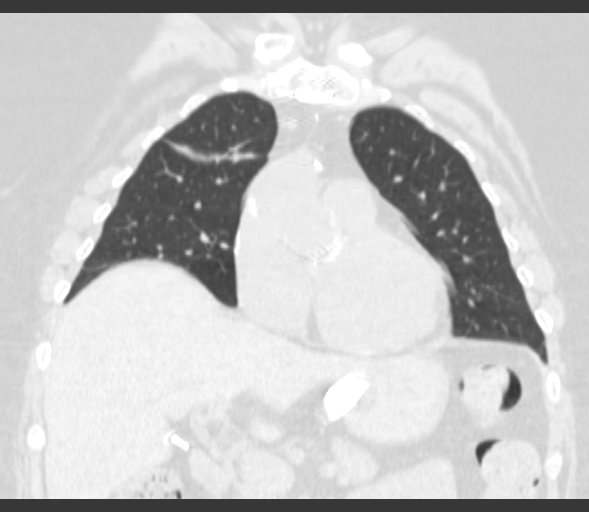
[im 88/146  lung]
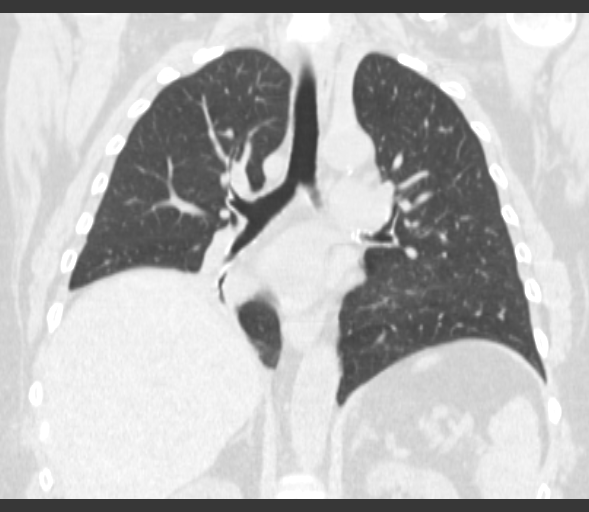

[15 of 36 positions shown; findings below may reference images not displayed]

FINDINGS: Postop clips in the right axillary region. Prior median sternotomy
for surgical repair of the ascending aorta and aortic valve. No
adenopathy. Normal heart size. No pericardial or pleural effusion.
No hiatal hernia.

Included upper abdomen demonstrates prior cholecystectomy and
laparoscopic gastric banding. No acute upper abdominal findings.

Minor degenerative changes of the thoracic spine.

Lung windows demonstrate chronic bandlike scar in the right upper
lobe. Minor dependent bibasilar subsegmental atelectasis. No focal
pneumonia, collapse or consolidation. No interstitial process,
edema, pleural fluid, or pneumothorax.

No acute osseous finding.
IMPRESSION: Stable postoperative findings.

Bi basilar subsegmental atelectasis

Chronic right upper lobe scarring

No acute airspace process or pneumonia.  Negative for CHF.

## 2015-12-23 ENCOUNTER — Telehealth: Payer: Self-pay | Admitting: Internal Medicine

## 2015-12-23 DIAGNOSIS — M25561 Pain in right knee: Secondary | ICD-10-CM

## 2015-12-23 DIAGNOSIS — M25562 Pain in left knee: Principal | ICD-10-CM

## 2015-12-23 NOTE — Telephone Encounter (Signed)
Caller name: Tamela GammonCynthia Reny Relationship to patient: patient Can be reached: [phone]  Reason for call: pt request to get a refill on Tramadol. [facility]

## 2015-12-23 NOTE — Telephone Encounter (Signed)
Not been seen since 2016.  Do not feel comfortable refilling. Defer to PCP.

## 2015-12-26 MED ORDER — TRAMADOL HCL 50 MG PO TABS: 50.0000 mg | ORAL_TABLET | Freq: Two times a day (BID) | ORAL | Status: DC | PRN

## 2015-12-26 NOTE — Telephone Encounter (Signed)
Refill for 30 days only.  OFFICE VISIT NEEDED prior to any more refills 

## 2015-12-26 NOTE — Telephone Encounter (Signed)
Patient notified PT approved.

## 2015-12-26 NOTE — Telephone Encounter (Signed)
PT ordered.  Melissa see message from patient [place]

## 2015-12-26 NOTE — Telephone Encounter (Signed)
Patient has an appoint ment scheduled for 01/23/16 for follow up but would like to do PT in meantime needs prescription for PT to evaluate and treat  On going Knee pain . Sent to [phone] [facility].

## 2015-12-30 NOTE — Telephone Encounter (Signed)
Patient stated that the facility did not receive the order.

## 2016-01-02 NOTE — Telephone Encounter (Signed)
PT has not received order ?

## 2016-01-03 NOTE — Telephone Encounter (Signed)
No I have not faxed the order since this is the first I have seen of this. I will print the order from her chart and fax.

## 2016-01-12 ENCOUNTER — Telehealth: Payer: Self-pay | Admitting: Internal Medicine

## 2016-01-12 MED ORDER — HYDROCODONE-ACETAMINOPHEN 5-325 MG PO TABS: 1.0000 | ORAL_TABLET | Freq: Four times a day (QID) | ORAL | Status: DC | PRN

## 2016-01-12 NOTE — Telephone Encounter (Signed)
She can pick up an rx tomorrow but remind her that this was a courtesy,  She HAS to be seen every 6 months for refills on controlled substances.  Have her make an appt

## 2016-01-12 NOTE — Telephone Encounter (Signed)
Refill request for Hydrocondone-acetaminophen, last seen 23SEP2016, last filled 3SEP2016.  Please advise.

## 2016-01-12 NOTE — Telephone Encounter (Signed)
Patient was informed to pick up prescription from front desk tomorrow and schedule an appointment with Dr. Darrick Huntsmanullo for Follow Up.

## 2016-01-12 NOTE — Telephone Encounter (Signed)
Pt called wanting to know if she can get a Rx for HYDROcodone-acetaminophen (NORCO/VICODIN) 5-325 MG per tablet. Call pt when it's ready for pick up.  Call pt @ [phone]. Thank you!

## 2016-01-18 DIAGNOSIS — M1611 Unilateral primary osteoarthritis, right hip: Secondary | ICD-10-CM | POA: Insufficient documentation

## 2016-01-23 ENCOUNTER — Ambulatory Visit: Payer: BC Managed Care – PPO | Admitting: Internal Medicine

## 2016-03-22 ENCOUNTER — Encounter: Payer: BC Managed Care – PPO | Admitting: Internal Medicine

## 2016-05-03 ENCOUNTER — Encounter: Payer: BC Managed Care – PPO | Admitting: Internal Medicine

## 2016-05-03 DIAGNOSIS — Z0289 Encounter for other administrative examinations: Secondary | ICD-10-CM

## 2016-05-09 ENCOUNTER — Ambulatory Visit: Payer: BC Managed Care – PPO | Admitting: Family

## 2016-05-16 ENCOUNTER — Ambulatory Visit: Payer: BC Managed Care – PPO | Admitting: Family

## 2016-06-05 ENCOUNTER — Other Ambulatory Visit: Payer: Self-pay | Admitting: Internal Medicine

## 2016-06-05 MED ORDER — CITALOPRAM HYDROBROMIDE 40 MG PO TABS: 40.0000 mg | ORAL_TABLET | Freq: Every day | ORAL | 0 refills | Status: DC

## 2016-06-05 NOTE — Telephone Encounter (Signed)
Refill for 30 days,  rx printed,  [facility]

## 2016-06-05 NOTE — Telephone Encounter (Signed)
Pt scheduled for CPE on 11/28. Due to hx of cancellations & no shows. Please advise if okay to refill.

## 2016-06-05 NOTE — Telephone Encounter (Signed)
Pt called and was requesting a refill on citalopram (CELEXA) 40 MG tablet. She was wondering ifit could be refilled for enough to get her to her appt on 11/28. Please advise, thank you!  Pharmacy - [facility] [address], [place], [place] [address]  Call pt @ [phone]

## 2016-06-06 NOTE — Telephone Encounter (Signed)
Patient notified script faxed patient having congestion and wheezing advised patient if she continues to or if symptoms increase to [facility] or nearest ED.

## 2016-06-19 ENCOUNTER — Ambulatory Visit (INDEPENDENT_AMBULATORY_CARE_PROVIDER_SITE_OTHER): Payer: Medicare Other | Admitting: Internal Medicine

## 2016-06-19 ENCOUNTER — Encounter: Payer: Self-pay | Admitting: Internal Medicine

## 2016-06-19 VITALS — BP 150/90 | HR 84 | Temp 98.0°F | Resp 12 | Ht 66.0 in | Wt 318.0 lb

## 2016-06-19 DIAGNOSIS — E559 Vitamin D deficiency, unspecified: Secondary | ICD-10-CM | POA: Diagnosis not present

## 2016-06-19 DIAGNOSIS — M25562 Pain in left knee: Secondary | ICD-10-CM

## 2016-06-19 DIAGNOSIS — M25561 Pain in right knee: Secondary | ICD-10-CM

## 2016-06-19 DIAGNOSIS — I10 Essential (primary) hypertension: Secondary | ICD-10-CM

## 2016-06-19 DIAGNOSIS — E785 Hyperlipidemia, unspecified: Secondary | ICD-10-CM

## 2016-06-19 DIAGNOSIS — Z79899 Other long term (current) drug therapy: Secondary | ICD-10-CM | POA: Diagnosis not present

## 2016-06-19 DIAGNOSIS — G8929 Other chronic pain: Secondary | ICD-10-CM

## 2016-06-19 DIAGNOSIS — F3341 Major depressive disorder, recurrent, in partial remission: Secondary | ICD-10-CM

## 2016-06-19 DIAGNOSIS — Z7901 Long term (current) use of anticoagulants: Secondary | ICD-10-CM

## 2016-06-19 DIAGNOSIS — E034 Atrophy of thyroid (acquired): Secondary | ICD-10-CM

## 2016-06-19 LAB — LIPID PANEL
Cholesterol: 220 mg/dL — ABNORMAL HIGH (ref 0–200)
HDL: 62.7 mg/dL (ref >39.00)
NONHDL: 157.34
Total CHOL/HDL Ratio: 4
Triglycerides: 214 mg/dL — ABNORMAL HIGH (ref 0.0–149.0)
VLDL: 42.8 mg/dL — ABNORMAL HIGH (ref 0.0–40.0)

## 2016-06-19 LAB — COMPREHENSIVE METABOLIC PANEL
ALBUMIN: 4.2 g/dL (ref 3.5–5.2)
ALK PHOS: 75 U/L (ref 39–117)
ALT: 13 U/L (ref 0–35)
AST: 20 U/L (ref 0–37)
BUN: 12 mg/dL (ref 6–23)
CHLORIDE: 103 meq/L (ref 96–112)
CO2: 29 mEq/L (ref 19–32)
CREATININE: 1.14 mg/dL (ref 0.40–1.20)
Calcium: 9.1 mg/dL (ref 8.4–10.5)
GFR: 50.24 mL/min — ABNORMAL LOW (ref >60.00)
GLUCOSE: 83 mg/dL (ref 70–99)
POTASSIUM: 4.1 meq/L (ref 3.5–5.1)
SODIUM: 140 meq/L (ref 135–145)
TOTAL PROTEIN: 7.4 g/dL (ref 6.0–8.3)
Total Bilirubin: 0.5 mg/dL (ref 0.2–1.2)

## 2016-06-19 LAB — LDL CHOLESTEROL, DIRECT: Direct LDL: 111 mg/dL

## 2016-06-19 LAB — VITAMIN D 25 HYDROXY (VIT D DEFICIENCY, FRACTURES): VITD: 14.66 ng/mL — AB (ref 30.00–100.00)

## 2016-06-19 LAB — TSH: TSH: 27.27 u[IU]/mL — ABNORMAL HIGH (ref 0.35–4.50)

## 2016-06-19 MED ORDER — ALPRAZOLAM 0.25 MG PO TABS: 0.2500 mg | ORAL_TABLET | Freq: Every day | ORAL | 5 refills | Status: DC | PRN

## 2016-06-19 MED ORDER — TRAMADOL HCL 50 MG PO TABS: 50.0000 mg | ORAL_TABLET | Freq: Two times a day (BID) | ORAL | 5 refills | Status: DC | PRN

## 2016-06-19 MED ORDER — HYDROCODONE-ACETAMINOPHEN 5-325 MG PO TABS: 1.0000 | ORAL_TABLET | Freq: Two times a day (BID) | ORAL | 0 refills | Status: DC

## 2016-06-19 MED ORDER — SOLIFENACIN SUCCINATE 10 MG PO TABS: 10.0000 mg | ORAL_TABLET | Freq: Every day | ORAL | 5 refills | Status: DC

## 2016-06-19 MED ORDER — CARVEDILOL 3.125 MG PO TABS: 3.1250 mg | ORAL_TABLET | Freq: Two times a day (BID) | ORAL | 2 refills | Status: DC

## 2016-06-19 NOTE — Patient Instructions (Addendum)
Please take a probiotic ( Align, Floraque or Culturelle), the generic version of one of these over the counter medications, or an alternative form ([employer]  Yogurt, ) for a minimum of 3 weeks to prevent a serious antibiotic associated diarrhea  Called clostridium dificile colitis.  Taking a probiotic may also prevent vaginitis due to yeast infections and can be continued indefinitely if you feel that it improves your digestion or your elimination (bowels).    I have refilled your  Carvedilol .  If your BP is not 120/70 or less after a week.  Increase dose to 6.125 mg

## 2016-06-19 NOTE — Progress Notes (Signed)
Subjective:  Patient ID: Maria Torres, female    DOB: 06-10-1946  Age: 70 y.o. MRN: [id number]  CC: The primary encounter diagnosis was Essential hypertension. Diagnoses of Vitamin D deficiency, Long-term use of high-risk medication, Hyperlipidemia LDL goal <130, Obesity, Class III, BMI 40-49.9 (morbid obesity) (HCC), Chronic pain of both knees, Benign hypertension, Hypothyroidism due to acquired atrophy of thyroid, Long term current use of anticoagulant therapy, and Recurrent major depressive disorder, in partial remission (HCC) were also pertinent to this visit.  HPI Maria JarvisCynthia M Binns presents for follow up on chronic conditions  COPD exacerbation treated on Sunday with Augmentin and Symbicort refilled at 50% .  Steroid injection given  On Sunday  Has been Out of BP meds for over a week. Losartan was stopped during last hospitalization.  Just taking carvedilol  [facility]  Tomorrow next appt.  Recent elevation due to tramadol.  COPD exacerbation started one week ago .  Symptoms improved since being treated on Sunday.  No appetite  But feeling better    Needs refill alprazolam. Using prn insomnia Using hydrocodone  Sparingly,  Not daily for hip pain Using tramadol as needed for headaches  Not more than 3-4 tims per week   HAS NOT TAKEN WELLBUTRIN IN SEVERAL MONTHS .  NEEDS TO CONTINUE  CITALOPRAM.  OBESITY: WALKING DAILY,  PLANNIng TO USE A SALT WATER POOL      Outpatient Medications Prior to Visit  Medication Sig Dispense Refill  . albuterol (PROVENTIL HFA;VENTOLIN HFA) 108 (90 BASE) MCG/ACT inhaler Inhale 3 puffs into the lungs 4 (four) times daily.    Marland Kitchen. albuterol (PROVENTIL) (2.5 MG/3ML) 0.083% nebulizer solution Take 3 mLs (2.5 mg total) by nebulization every 4 (four) hours as needed for wheezing or shortness of breath. 150 mL 5  . amoxicillin (AMOXIL) 250 MG capsule Take 250 mg by mouth 3 (three) times daily. Before dental procedures    . benzonatate  (TESSALON) 200 MG capsule Take 1 capsule (200 mg total) by mouth 3 (three) times daily as needed for cough. 60 capsule 1  . budesonide-formoterol (SYMBICORT) 160-4.5 MCG/ACT inhaler Inhale 2 puffs into the lungs 2 (two) times daily. 1 Inhaler 12  . citalopram (CELEXA) 40 MG tablet Take 1 tablet (40 mg total) by mouth daily. 30 tablet 0  . levalbuterol (XOPENEX) 0.63 MG/3ML nebulizer solution Take 3 mLs (0.63 mg total) by nebulization every 8 (eight) hours as needed for wheezing or shortness of breath. 90 mL 1  . torsemide (DEMADEX) 20 MG tablet Take 20 mg by mouth daily.     Marland Kitchen. warfarin (COUMADIN) 10 MG tablet TAKE 1 TABLET BY MOUTH AS DIRECTED 60 tablet 5  . ALPRAZolam (XANAX) 0.25 MG tablet Take 1 tablet (0.25 mg total) by mouth daily as needed for sleep or anxiety. 30 tablet 3  . carvedilol (COREG) 3.125 MG tablet Take 1 tablet (3.125 mg total) by mouth every 12 (twelve) hours. 60 tablet 5  . HYDROcodone-acetaminophen (NORCO/VICODIN) 5-325 MG tablet Take 1 tablet by mouth every 6 (six) hours as needed for moderate pain. 90 tablet 0  . levothyroxine (SYNTHROID, LEVOTHROID) 175 MCG tablet Take 1 tablet (175 mcg total) by mouth daily. 90 tablet 1  . traMADol (ULTRAM) 50 MG tablet Take 1-2 tablets (50-100 mg total) by mouth every 12 (twelve) hours as needed. 60 tablet 0  . benzonatate (TESSALON) 200 MG capsule Take 1 capsule (200 mg total) by mouth 3 (three) times daily as needed for cough. (  Patient not taking: Reported on 06/19/2016) 60 capsule 2  . buPROPion (WELLBUTRIN XL) 300 MG 24 hr tablet take 1 tablet by mouth once daily (Patient not taking: Reported on 06/19/2016) 90 tablet 1  . hydrocortisone 2.5 % cream Apply topically 2 (two) times daily. (Patient not taking: Reported on 06/19/2016) 30 g 0  . losartan (COZAAR) 50 MG tablet Take 1 tablet (50 mg total) by mouth daily. (Patient not taking: Reported on 06/19/2016) 90 tablet 3  . ranitidine (ZANTAC) 150 MG capsule Take 150 mg by mouth 2 (two)  times daily.    Marland Kitchen. triamcinolone cream (KENALOG) 0.1 % Apply 1 application topically 2 (two) times daily. (Patient not taking: Reported on 06/19/2016) 80 g 1   No facility-administered medications prior to visit.     Review of Systems;  Patient denies headache, fevers, malaise, unintentional weight loss, skin rash, eye pain, sinus congestion and sinus pain, sore throat, dysphagia,  hemoptysis , cough, dyspnea, wheezing, chest pain, palpitations, orthopnea, edema, abdominal pain, nausea, melena, diarrhea, constipation, flank pain, dysuria, hematuria, urinary  Frequency, nocturia, numbness, tingling, seizures,  Focal weakness, Loss of consciousness,  Tremor, and suicidal ideation.      Objective:  BP (!) 150/90   Pulse 84   Temp 98 F (36.7 C) (Oral)   Resp 12   Ht 5\' 6"  (1.676 m)   Wt (!) 318 lb (144.2 kg)   LMP 08/31/2013   SpO2 95%   BMI 51.33 kg/m   BP Readings from Last 3 Encounters:  06/19/16 (!) 150/90  05/11/15 (!) 142/80  04/15/15 126/78    Wt Readings from Last 3 Encounters:  06/19/16 (!) 318 lb (144.2 kg)  05/11/15 (!) 332 lb 3.2 oz (150.7 kg)  04/15/15 (!) 326 lb (147.9 kg)    General appearance: alert, cooperative and appears stated age Ears: normal TM's and external ear canals both ears Throat: lips, mucosa, and tongue normal; teeth and gums normal Neck: no adenopathy, no carotid bruit, supple, symmetrical, trachea midline and thyroid not enlarged, symmetric, no tenderness/mass/nodules Back: symmetric, no curvature. ROM normal. No CVA tenderness. Lungs: clear to auscultation bilaterally Heart: regular rate and rhythm, S1, S2 normal, no murmur, click, rub or gallop Abdomen: soft, non-tender; bowel sounds normal; no masses,  no organomegaly Pulses: 2+ and symmetric Skin: Skin color, texture, turgor normal. No rashes or lesions Lymph nodes: Cervical, supraclavicular, and axillary nodes normal.  No results found for: HGBA1C  Lab Results  Component Value Date    CREATININE 1.14 06/19/2016   CREATININE 1.35 (H) 04/15/2015   CREATININE 1.26 07/07/2014    Lab Results  Component Value Date   WBC 9.1 06/19/2016   HGB 13.6 06/19/2016   HCT 40.3 06/19/2016   PLT 307.0 06/19/2016   GLUCOSE 83 06/19/2016   CHOL 220 (H) 06/19/2016   TRIG 214.0 (H) 06/19/2016   HDL 62.70 06/19/2016   LDLDIRECT 111.0 06/19/2016   LDLCALC 135 (H) 04/15/2015   ALT 13 06/19/2016   AST 20 06/19/2016   NA 140 06/19/2016   K 4.1 06/19/2016   CL 103 06/19/2016   CREATININE 1.14 06/19/2016   BUN 12 06/19/2016   CO2 29 06/19/2016   TSH 27.27 (H) 06/19/2016   INR 2.6 (H) 04/15/2015      Assessment & Plan:   Problem List Items Addressed This Visit    Benign hypertension    Resuming carvedilol after one week suspension due to lack of follow up       Relevant Medications  carvedilol (COREG) 3.125 MG tablet   Depression, major, recurrent (HCC)    Managed currently with citalopram.  Discontinued wellbutrin several months ago.  Continue prn alprazolam       Relevant Medications   ALPRAZolam (XANAX) 0.25 MG tablet   Hypothyroidism    ,Thyroid function is very underactive on current dose.  I have sent a higher dose of levothyroxine to her pharmacy and would like patient to change immediatley.  Lab Results  Component Value Date   TSH 27.27 (H) 06/19/2016         Relevant Medications   carvedilol (COREG) 3.125 MG tablet   levothyroxine (SYNTHROID, LEVOTHROID) 200 MCG tablet   Knee pain, bilateral    Managed with prn vicodin not daily . Refill history confirmed via [place] Controlled Substance databas, accessed by me today..      Long term current use of anticoagulant therapy    Managed by [facility].with monthly iNR  Lab Results  Component Value Date   WBC 9.1 06/19/2016   HGB 13.6 06/19/2016   HCT 40.3 06/19/2016   MCV 87.9 06/19/2016   PLT 307.0 06/19/2016         Obesity, Class III, BMI 40-49.9 (morbid obesity) (HCC)    I have addressed  BMI and  recommended a low glycemic index diet utilizing smaller more frequent meals to increase metabolism.  I have also recommended that patient start exercising with a goal of 30 minutes of aerobic exercise a minimum of 5 days per week.         Other Visit Diagnoses    Essential hypertension    -  Primary   Relevant Medications   carvedilol (COREG) 3.125 MG tablet   Other Relevant Orders   Comprehensive metabolic panel (Completed)   Vitamin D deficiency       Relevant Orders   VITAMIN D 25 Hydroxy (Vit-D Deficiency, Fractures) (Completed)   Long-term use of high-risk medication       Relevant Orders   CBC with Differential/Platelet (Completed)   Hyperlipidemia LDL goal <130       Relevant Medications   carvedilol (COREG) 3.125 MG tablet   Other Relevant Orders   TSH (Completed)   Lipid panel (Completed)      I have changed Ms. Feazell's HYDROcodone-acetaminophen and levothyroxine. I am also having her start on solifenacin. Additionally, I am having her maintain her amoxicillin, albuterol, budesonide-formoterol, torsemide, warfarin, benzonatate, albuterol, losartan, levalbuterol, benzonatate, ranitidine, hydrocortisone, triamcinolone cream, buPROPion, citalopram, amoxicillin-clavulanate, carvedilol, ALPRAZolam, and traMADol.  Meds ordered this encounter  Medications  . amoxicillin-clavulanate (AUGMENTIN) 875-125 MG tablet    Sig: Take 1 tablet by mouth 2 (two) times daily.  . carvedilol (COREG) 3.125 MG tablet    Sig: Take 1 tablet (3.125 mg total) by mouth every 12 (twelve) hours.    Dispense:  180 tablet    Refill:  2  . solifenacin (VESICARE) 10 MG tablet    Sig: Take 1 tablet (10 mg total) by mouth daily.    Dispense:  30 tablet    Refill:  5  . ALPRAZolam (XANAX) 0.25 MG tablet    Sig: Take 1 tablet (0.25 mg total) by mouth daily as needed for sleep or anxiety.    Dispense:  30 tablet    Refill:  5  . HYDROcodone-acetaminophen (NORCO/VICODIN) 5-325 MG tablet    Sig: Take 1  tablet by mouth 2 (two) times daily. As needed for severe pain    Dispense:  60 tablet  Refill:  0  . DISCONTD: traMADol (ULTRAM) 50 MG tablet    Sig: Take 1 tablet (50 mg total) by mouth every 12 (twelve) hours as needed.    Dispense:  60 tablet    Refill:  5    Refill for 30 days only.  OFFICE VISIT NEEDED prior to any more refills  . traMADol (ULTRAM) 50 MG tablet    Sig: Take 1 tablet (50 mg total) by mouth every 12 (twelve) hours as needed.    Dispense:  60 tablet    Refill:  5  . levothyroxine (SYNTHROID, LEVOTHROID) 200 MCG tablet    Sig: Take 1 tablet (200 mcg total) by mouth daily.    Dispense:  30 tablet    Refill:  2    Medications Discontinued During This Encounter  Medication Reason  . carvedilol (COREG) 3.125 MG tablet Reorder  . ALPRAZolam (XANAX) 0.25 MG tablet Reorder  . HYDROcodone-acetaminophen (NORCO/VICODIN) 5-325 MG tablet Reorder  . traMADol (ULTRAM) 50 MG tablet Reorder  . traMADol (ULTRAM) 50 MG tablet Reorder  . levothyroxine (SYNTHROID, LEVOTHROID) 175 MCG tablet Reorder    Follow-up: Return in about 6 months (around 12/17/2016).   Sherlene Shams, MD

## 2016-06-19 NOTE — Progress Notes (Signed)
Pre-visit discussion using our clinic review tool. No additional management support is needed unless otherwise documented below in the visit note.  

## 2016-06-20 LAB — CBC WITH DIFFERENTIAL/PLATELET
Basophils Absolute: 0.1 K/uL (ref 0.0–0.1)
Basophils Relative: 0.7 % (ref 0.0–3.0)
Eosinophils Absolute: 0.2 K/uL (ref 0.0–0.7)
Eosinophils Relative: 2.5 % (ref 0.0–5.0)
HCT: 40.3 % (ref 36.0–46.0)
Hemoglobin: 13.6 g/dL (ref 12.0–15.0)
Lymphocytes Relative: 14 % (ref 12.0–46.0)
Lymphs Abs: 1.3 K/uL (ref 0.7–4.0)
MCHC: 33.8 g/dL (ref 30.0–36.0)
MCV: 87.9 fl (ref 78.0–100.0)
Monocytes Absolute: 0.5 K/uL (ref 0.1–1.0)
Monocytes Relative: 5 % (ref 3.0–12.0)
Neutro Abs: 7.1 K/uL (ref 1.4–7.7)
Neutrophils Relative %: 77.8 % — ABNORMAL HIGH (ref 43.0–77.0)
Platelets: 307 K/uL (ref 150.0–400.0)
RBC: 4.59 Mil/uL (ref 3.87–5.11)
RDW: 14.7 % (ref 11.5–15.5)
WBC: 9.1 K/uL (ref 4.0–10.5)

## 2016-06-21 ENCOUNTER — Other Ambulatory Visit: Payer: Self-pay | Admitting: Internal Medicine

## 2016-06-21 DIAGNOSIS — E7800 Pure hypercholesterolemia, unspecified: Secondary | ICD-10-CM

## 2016-06-21 DIAGNOSIS — E034 Atrophy of thyroid (acquired): Secondary | ICD-10-CM

## 2016-06-21 MED ORDER — LEVOTHYROXINE SODIUM 200 MCG PO TABS: 200.0000 ug | ORAL_TABLET | Freq: Every day | ORAL | 2 refills | Status: DC

## 2016-06-21 NOTE — Assessment & Plan Note (Signed)
Resuming carvedilol after one week suspension due to lack of follow up

## 2016-06-21 NOTE — Progress Notes (Signed)
lipid

## 2016-06-21 NOTE — Assessment & Plan Note (Signed)
Managed currently with citalopram.  Discontinued wellbutrin several months ago.  Continue prn alprazolam

## 2016-06-21 NOTE — Assessment & Plan Note (Addendum)
I have addressed  BMI and recommended a low glycemic index diet utilizing smaller more frequent meals to increase metabolism.  I have also recommended that patient start exercising with a goal of 30 minutes of aerobic exercise a minimum of 5 days per week.  

## 2016-06-21 NOTE — Assessment & Plan Note (Signed)
Managed by [facility].with monthly iNR  Lab Results  Component Value Date   WBC 9.1 06/19/2016   HGB 13.6 06/19/2016   HCT 40.3 06/19/2016   MCV 87.9 06/19/2016   PLT 307.0 06/19/2016

## 2016-06-21 NOTE — Assessment & Plan Note (Signed)
Managed with prn vicodin not daily . Refill history confirmed via [place] Controlled Substance databas, accessed by me today..Marland Kitchen

## 2016-06-21 NOTE — Assessment & Plan Note (Signed)
,  Thyroid function is very underactive on current dose.  I have sent a higher dose of levothyroxine to her pharmacy and would like patient to change immediatley.  Lab Results  Component Value Date   TSH 27.27 (H) 06/19/2016

## 2016-06-21 NOTE — Assessment & Plan Note (Signed)
Based on current lipid profile, the risk of clinically significant CAD is 13% over the next 10 years, using the [place]ham risk calculator. The [employer] recommends starting patients aged 69 or higher on moderate intensity statin therapy for LDL between 70-189 and 10 yr risk of CAD > 7.5% ;  Given her underactive thyroid, will repeat when thyroid is functioning normally.   Lab Results  Component Value Date   CHOL 220 (H) 06/19/2016   HDL 62.70 06/19/2016   LDLCALC 135 (H) 04/15/2015   LDLDIRECT 111.0 06/19/2016   TRIG 214.0 (H) 06/19/2016   CHOLHDL 4 06/19/2016

## 2016-07-15 ENCOUNTER — Other Ambulatory Visit: Payer: Self-pay | Admitting: Internal Medicine

## 2016-07-26 ENCOUNTER — Telehealth: Payer: Self-pay | Admitting: Internal Medicine

## 2016-07-26 MED ORDER — LEVOTHYROXINE SODIUM 200 MCG PO TABS: 200.0000 ug | ORAL_TABLET | Freq: Every day | ORAL | 2 refills | Status: DC

## 2016-07-26 NOTE — Telephone Encounter (Signed)
Pt called and is requesting a refill on her levothyroxine (SYNTHROID, LEVOTHROID) 200 MCG tablet. She had an appt for 07/27/16 for labs but is unable to make it do to weather. She only has 1 pill for tomorrow and then will be out. Please advise, thank you!  Pharmacy - [id number] - [place], [place] - [address]  Call pt @ [phone]

## 2016-07-26 NOTE — Telephone Encounter (Signed)
rx setn to [facility]

## 2016-07-27 ENCOUNTER — Other Ambulatory Visit: Payer: Medicare Other

## 2016-08-03 ENCOUNTER — Other Ambulatory Visit: Payer: Medicare Other

## 2016-08-15 ENCOUNTER — Other Ambulatory Visit: Payer: Self-pay | Admitting: Internal Medicine

## 2016-09-05 ENCOUNTER — Telehealth: Payer: Self-pay | Admitting: *Deleted

## 2016-09-05 MED ORDER — TRIAMCINOLONE ACETONIDE 0.1 % EX CREA
1.0000 | TOPICAL_CREAM | Freq: Two times a day (BID) | CUTANEOUS | 1 refills | Status: DC
Start: 2016-09-05 — End: 2018-10-27

## 2016-09-05 NOTE — Telephone Encounter (Signed)
Spoke with patient advised of below , she verbalized understanding .  She states legs are not swelling.

## 2016-09-05 NOTE — Telephone Encounter (Signed)
I refilled the kenalog because that's what is typically used,.  Twice daily , along with Eucerin skin cream twice daily.  She needs to use both because if her skin is dray one withotu the other will not work.  Also needs to elevate legs if swollen and wear compression stockings if she can tolerate them.

## 2016-09-05 NOTE — Telephone Encounter (Signed)
Reason for call:rash on both legs lower Symptoms:rash both bilateral going up  leg , dry itchy , sees bumps on legs, skins reminds her of alligator   skin , bumps present  Duration  Present 4 days  Medications:hydrocortisone cream , helped some before but didn't get rid of , also used kenalog 0.1% cream Please advise

## 2016-09-05 NOTE — Telephone Encounter (Signed)
pt has a rash on both legs,pt describes the rash as being dry and very itchy.She has a Hx of this rash. She was prescribed a cream in the past and that did not work. Pt requested a different Rx. Pt contact [phone]

## 2016-09-17 ENCOUNTER — Telehealth: Payer: Self-pay | Admitting: Internal Medicine

## 2016-09-17 NOTE — Telephone Encounter (Signed)
Pt called to inform that her mail order pharmacy has changed to [facility] and her 30 day order is [facility]. Thank you!

## 2016-09-17 NOTE — Telephone Encounter (Signed)
Pharmacies are updated.

## 2016-09-20 ENCOUNTER — Telehealth: Payer: Self-pay | Admitting: *Deleted

## 2016-09-20 MED ORDER — WARFARIN SODIUM 10 MG PO TABS: 10.0000 mg | ORAL_TABLET | ORAL | 5 refills | Status: DC

## 2016-09-20 MED ORDER — CARVEDILOL 3.125 MG PO TABS: 3.1250 mg | ORAL_TABLET | Freq: Two times a day (BID) | ORAL | 0 refills | Status: DC

## 2016-09-20 NOTE — Telephone Encounter (Signed)
MANAGED BY [facility],  DOSE IS 10 MG DAILY,  LIVES IN [place] BUT PREFERS TO KEEP ME AS HER PCP

## 2016-09-20 NOTE — Telephone Encounter (Signed)
Rf req for Warfarin 10 mg. Last OV: 06/19/16 Last INR: 04/15/15 was  2.6.  Ok to Rf Warfarin? Does [place] in [place]?

## 2016-09-21 MED ORDER — SOLIFENACIN SUCCINATE 10 MG PO TABS: 10.0000 mg | ORAL_TABLET | Freq: Every day | ORAL | 0 refills | Status: DC

## 2016-09-21 MED ORDER — CITALOPRAM HYDROBROMIDE 40 MG PO TABS: 40.0000 mg | ORAL_TABLET | Freq: Every day | ORAL | 0 refills | Status: DC

## 2016-09-21 MED ORDER — LEVOTHYROXINE SODIUM 200 MCG PO TABS: 200.0000 ug | ORAL_TABLET | Freq: Every day | ORAL | 0 refills | Status: DC

## 2016-09-27 NOTE — Telephone Encounter (Signed)
I called pharmacy and advised them of current sig on coumadin 10 mg 1 qd per Dr. Darrick Huntsmanullo.

## 2016-09-27 NOTE — Telephone Encounter (Signed)
Pharmacy called and needs clarification on this medication. Please advise, thank you!  Call [phone]

## 2016-12-11 ENCOUNTER — Encounter: Payer: Self-pay | Admitting: Internal Medicine

## 2016-12-12 MED ORDER — TORSEMIDE 20 MG PO TABS: 20.0000 mg | ORAL_TABLET | Freq: Every day | ORAL | 0 refills | Status: DC

## 2016-12-12 NOTE — Telephone Encounter (Signed)
I will refill for one month, but patinet needs a BMET before any additioAL REFILLS will be done

## 2016-12-21 ENCOUNTER — Ambulatory Visit: Payer: Medicare Other | Admitting: Internal Medicine

## 2017-01-03 ENCOUNTER — Other Ambulatory Visit: Payer: Self-pay | Admitting: Internal Medicine

## 2017-01-03 NOTE — Telephone Encounter (Signed)
Last OV was 06/19/16, got refill then for #60 with 5 refills.  On 5/22 you requested her to get follow up labs for another refill, she agreed and then on that day 6/1 cancelled her appt.  Please advise, thanks

## 2017-01-21 ENCOUNTER — Other Ambulatory Visit: Payer: Self-pay | Admitting: Internal Medicine

## 2017-02-20 ENCOUNTER — Other Ambulatory Visit: Payer: Self-pay | Admitting: Internal Medicine

## 2017-02-28 ENCOUNTER — Encounter: Payer: Self-pay | Admitting: *Deleted

## 2017-03-05 ENCOUNTER — Telehealth: Payer: Self-pay | Admitting: Internal Medicine

## 2017-03-05 NOTE — Telephone Encounter (Signed)
Called pt to schedule AWV. No answer, No voicemail.  NOTE* Never had AWV before

## 2017-03-18 ENCOUNTER — Encounter: Payer: Self-pay | Admitting: Internal Medicine

## 2017-03-18 ENCOUNTER — Ambulatory Visit (INDEPENDENT_AMBULATORY_CARE_PROVIDER_SITE_OTHER): Payer: Medicare Other | Admitting: Internal Medicine

## 2017-03-18 VITALS — BP 130/76 | HR 78 | Temp 98.3°F | Ht 66.0 in | Wt 315.8 lb

## 2017-03-18 DIAGNOSIS — G8929 Other chronic pain: Secondary | ICD-10-CM

## 2017-03-18 DIAGNOSIS — Z9884 Bariatric surgery status: Secondary | ICD-10-CM

## 2017-03-18 DIAGNOSIS — E034 Atrophy of thyroid (acquired): Secondary | ICD-10-CM

## 2017-03-18 DIAGNOSIS — M25562 Pain in left knee: Secondary | ICD-10-CM | POA: Diagnosis not present

## 2017-03-18 DIAGNOSIS — E559 Vitamin D deficiency, unspecified: Secondary | ICD-10-CM

## 2017-03-18 DIAGNOSIS — Z9989 Dependence on other enabling machines and devices: Secondary | ICD-10-CM

## 2017-03-18 DIAGNOSIS — E7800 Pure hypercholesterolemia, unspecified: Secondary | ICD-10-CM

## 2017-03-18 DIAGNOSIS — G4733 Obstructive sleep apnea (adult) (pediatric): Secondary | ICD-10-CM

## 2017-03-18 DIAGNOSIS — M25561 Pain in right knee: Secondary | ICD-10-CM

## 2017-03-18 DIAGNOSIS — Z7901 Long term (current) use of anticoagulants: Secondary | ICD-10-CM | POA: Diagnosis not present

## 2017-03-18 DIAGNOSIS — Z1231 Encounter for screening mammogram for malignant neoplasm of breast: Secondary | ICD-10-CM

## 2017-03-18 DIAGNOSIS — Z1239 Encounter for other screening for malignant neoplasm of breast: Secondary | ICD-10-CM

## 2017-03-18 MED ORDER — ALPRAZOLAM 0.25 MG PO TABS: 0.2500 mg | ORAL_TABLET | Freq: Every day | ORAL | 3 refills | Status: DC | PRN

## 2017-03-18 MED ORDER — TRAMADOL HCL 50 MG PO TABS: 50.0000 mg | ORAL_TABLET | Freq: Two times a day (BID) | ORAL | 5 refills | Status: DC | PRN

## 2017-03-18 MED ORDER — HYDROCODONE-ACETAMINOPHEN 5-325 MG PO TABS: 1.0000 | ORAL_TABLET | Freq: Two times a day (BID) | ORAL | 0 refills | Status: DC

## 2017-03-18 NOTE — Progress Notes (Signed)
Pre visit review using our clinic review tool, if applicable. No additional management support is needed unless otherwise documented below in the visit note. 

## 2017-03-18 NOTE — Progress Notes (Signed)
Subjective:  Patient ID: Maria Torres, female    DOB: 10/29/1945  Age: 71 y.o. MRN: [id number]  CC: The primary encounter diagnosis was Hypothyroidism due to acquired atrophy of thyroid. Diagnoses of Pure hypercholesterolemia, Long term current use of anticoagulant therapy, Vitamin D deficiency, Breast cancer screening, Obesity, Class III, BMI 40-49.9 (morbid obesity) (HCC), OSA on CPAP, and Chronic pain of both knees were also pertinent to this visit.  HPI Maria Torres presents for  Follow up on hypertension , obesity  And depression   She feels "really great for the first time in a long time."  She attributes her mood improvement to a reconciliation of her chronically dysfunctional relationship with her sister who continues to avoid contact with her.      Eating healthy.  Has lost 23 lbs in  2 months ,  Using a system where she has 2 daily Shakes called [employer]ire twice daily, along with increased intake of vegetables.    drinking a lot of water.  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  She has limited her use of vicodin to rare use,  Not occurring daily .  Currently out,  Has nt refilled in over 3 months per reviewed of database,.  She is using tramadol daily.  NSAIDs are contraindicated due to use of coumadin.    GAD:  She continues to use citalopam daily and and xanax sparingly   not daily   Outpatient Medications Prior to Visit  Medication Sig Dispense Refill  . albuterol (PROVENTIL HFA;VENTOLIN HFA) 108 (90 BASE) MCG/ACT inhaler Inhale 3 puffs into the lungs 4 (four) times daily.    Marland Kitchen albuterol (PROVENTIL) (2.5 MG/3ML) 0.083% nebulizer solution Take 3 mLs (2.5 mg total) by nebulization every 4 (four) hours as needed for wheezing or shortness of breath. 150 mL 5  . amoxicillin (AMOXIL) 250 MG capsule Take 250 mg by mouth 3 (three) times daily. Before dental procedures    . budesonide-formoterol (SYMBICORT) 160-4.5  MCG/ACT inhaler Inhale 2 puffs into the lungs 2 (two) times daily. 1 Inhaler 12  . carvedilol (COREG) 3.125 MG tablet Take 1 tablet (3.125 mg total) by mouth every 12 (twelve) hours. 180 tablet 0  . citalopram (CELEXA) 40 MG tablet Take 1 tablet (40 mg total) by mouth daily. 90 tablet 1  . hydrocortisone 2.5 % cream Apply topically 2 (two) times daily. 30 g 0  . levothyroxine (SYNTHROID, LEVOTHROID) 200 MCG tablet Take 1 tablet (200 mcg total) by mouth daily. 90 tablet 1  . losartan (COZAAR) 50 MG tablet Take 1 tablet (50 mg total) by mouth daily. 90 tablet 3  . torsemide (DEMADEX) 20 MG tablet Take 1 tablet (20 mg total) by mouth daily. 30 tablet 0  . triamcinolone cream (KENALOG) 0.1 % Apply 1 application topically 2 (two) times daily. 453.6 g 1  . VESICARE 10 MG tablet Take 1 tablet (10 mg total) by mouth daily. 90 tablet 1  . warfarin (COUMADIN) 10 MG tablet Take 1 tablet (10 mg total) by mouth See admin instructions. 60 tablet 5  . ALPRAZolam (XANAX) 0.25 MG tablet Take 1 tablet (0.25 mg total) by mouth daily as needed for sleep or anxiety. 30 tablet 5  . HYDROcodone-acetaminophen (NORCO/VICODIN) 5-325 MG tablet Take 1 tablet by mouth 2 (two) times daily. As needed for severe pain 60 tablet 0  . traMADol (ULTRAM) 50 MG tablet Take 1 tablet (50  mg total) by mouth every 12 (twelve) hours as needed. 60 tablet 5  . amoxicillin-clavulanate (AUGMENTIN) 875-125 MG tablet Take 1 tablet by mouth 2 (two) times daily.    . benzonatate (TESSALON) 200 MG capsule Take 1 capsule (200 mg total) by mouth 3 (three) times daily as needed for cough. 60 capsule 1  . benzonatate (TESSALON) 200 MG capsule Take 1 capsule (200 mg total) by mouth 3 (three) times daily as needed for cough. (Patient not taking: Reported on 06/19/2016) 60 capsule 2  . buPROPion (WELLBUTRIN XL) 300 MG 24 hr tablet take 1 tablet by mouth once daily (Patient not taking: Reported on 06/19/2016) 90 tablet 1  . levalbuterol (XOPENEX) 0.63 MG/3ML  nebulizer solution Take 3 mLs (0.63 mg total) by nebulization every 8 (eight) hours as needed for wheezing or shortness of breath. 90 mL 1  . ranitidine (ZANTAC) 150 MG capsule Take 150 mg by mouth 2 (two) times daily.     No facility-administered medications prior to visit.     Review of Systems;  Patient denies headache, fevers, malaise, unintentional weight loss, skin rash, eye pain, sinus congestion and sinus pain, sore throat, dysphagia,  hemoptysis , cough, dyspnea, wheezing, chest pain, palpitations, orthopnea, edema, abdominal pain, nausea, melena, diarrhea, constipation, flank pain, dysuria, hematuria, urinary  Frequency, nocturia, numbness, tingling, seizures,  Focal weakness, Loss of consciousness,  Tremor, insomnia, depression, anxiety, and suicidal ideation.      Objective:  BP 130/76   Pulse 78   Temp 98.3 F (36.8 C) (Oral)   Ht 5\' 6"  (1.676 m)   Wt (!) 315 lb 12.8 oz (143.2 kg)   LMP 08/31/2013   SpO2 93%   BMI 50.97 kg/m   BP Readings from Last 3 Encounters:  03/18/17 130/76  06/19/16 (!) 150/90  05/11/15 (!) 142/80    Wt Readings from Last 3 Encounters:  03/18/17 (!) 315 lb 12.8 oz (143.2 kg)  06/19/16 (!) 318 lb (144.2 kg)  05/11/15 (!) 332 lb 3.2 oz (150.7 kg)    General appearance: alert, cooperative and appears stated age Ears: normal TM's and external ear canals both ears Throat: lips, mucosa, and tongue normal; teeth and gums normal Neck: no adenopathy, no carotid bruit, supple, symmetrical, trachea midline and thyroid not enlarged, symmetric, no tenderness/mass/nodules Back: symmetric, no curvature. ROM normal. No CVA tenderness. Lungs: clear to auscultation bilaterally Heart: regular rate and rhythm, S1, S2 normal, no murmur, click, rub or gallop Abdomen: soft, non-tender; bowel sounds normal; no masses,  no organomegaly Pulses: 2+ and symmetric Skin: Skin color, texture, turgor normal. No rashes or lesions Lymph nodes: Cervical,  supraclavicular, and axillary nodes normal.  No results found for: HGBA1C  Lab Results  Component Value Date   CREATININE 1.14 06/19/2016   CREATININE 1.35 (H) 04/15/2015   CREATININE 1.26 07/07/2014    Lab Results  Component Value Date   WBC 9.1 06/19/2016   HGB 13.6 06/19/2016   HCT 40.3 06/19/2016   PLT 307.0 06/19/2016   GLUCOSE 83 06/19/2016   CHOL 220 (H) 06/19/2016   TRIG 214.0 (H) 06/19/2016   HDL 62.70 06/19/2016   LDLDIRECT 111.0 06/19/2016   LDLCALC 135 (H) 04/15/2015   ALT 13 06/19/2016   AST 20 06/19/2016   NA 140 06/19/2016   K 4.1 06/19/2016   CL 103 06/19/2016   CREATININE 1.14 06/19/2016   BUN 12 06/19/2016   CO2 29 06/19/2016   TSH 27.27 (H) 06/19/2016   INR 2.6 (H) 04/15/2015  Assessment & Plan:   Problem List Items Addressed This Visit    Hyperlipidemia   Relevant Orders   Comprehensive metabolic panel   Lipid panel   Hypothyroidism - Primary   Relevant Orders   TSH   Knee pain, bilateral    Managed with prn vicodin not daily . Refill history confirmed via [place] Controlled Substance databas, accessed by me today..refill given.  NSAIDs contraindicated.       Long term current use of anticoagulant therapy   Relevant Orders   CBC with Differential/Platelet   Obesity, Class III, BMI 40-49.9 (morbid obesity) (HCC)    I have congratulated her in reduction of   BMI and encouraged  Continued weight loss with goal of 10% of body weigh over the next 6 months using a low glycemic index diet and regular exercise a minimum of 5 days per week.        OSA on CPAP    Not using it .   Discussed the long term history of OSA , the risks of long term damage to heart and the signs and symptoms attributable to OSA.  Advised patient to consider  Resuming use and use alprazolam to develop tolerance to mask.        Other Visit Diagnoses    Vitamin D deficiency       Relevant Orders   VITAMIN D 25 Hydroxy (Vit-D Deficiency, Fractures)   Breast cancer  screening       Relevant Orders   MM DIGITAL SCREENING BILATERAL      I have discontinued Ms. Nobile's benzonatate, levalbuterol, benzonatate, ranitidine, buPROPion, and amoxicillin-clavulanate. I am also having her maintain her amoxicillin, albuterol, budesonide-formoterol, albuterol, losartan, hydrocortisone, triamcinolone cream, carvedilol, warfarin, torsemide, levothyroxine, citalopram, VESICARE, ALPRAZolam, HYDROcodone-acetaminophen, and traMADol.  Meds ordered this encounter  Medications  . ALPRAZolam (XANAX) 0.25 MG tablet    Sig: Take 1 tablet (0.25 mg total) by mouth daily as needed for sleep or anxiety.    Dispense:  30 tablet    Refill:  3  . HYDROcodone-acetaminophen (NORCO/VICODIN) 5-325 MG tablet    Sig: Take 1 tablet by mouth 2 (two) times daily. As needed for severe pain    Dispense:  60 tablet    Refill:  0  . traMADol (ULTRAM) 50 MG tablet    Sig: Take 1 tablet (50 mg total) by mouth every 12 (twelve) hours as needed.    Dispense:  60 tablet    Refill:  5   A total of 25 minutes of face to face time was spent with patient more than half of which was spent in counselling about the above mentioned conditions  and coordination of care  Medications Discontinued During This Encounter  Medication Reason  . ranitidine (ZANTAC) 150 MG capsule Patient has not taken in last 30 days  . levalbuterol (XOPENEX) 0.63 MG/3ML nebulizer solution Patient has not taken in last 30 days  . buPROPion (WELLBUTRIN XL) 300 MG 24 hr tablet Patient has not taken in last 30 days  . benzonatate (TESSALON) 200 MG capsule Patient has not taken in last 30 days  . benzonatate (TESSALON) 200 MG capsule Patient has not taken in last 30 days  . amoxicillin-clavulanate (AUGMENTIN) 875-125 MG tablet Patient has not taken in last 30 days  . ALPRAZolam (XANAX) 0.25 MG tablet Reorder  . HYDROcodone-acetaminophen (NORCO/VICODIN) 5-325 MG tablet Reorder  . traMADol (ULTRAM) 50 MG tablet Reorder     Follow-up: No Follow-up on file.   TULLO,  Aris Everts, MD

## 2017-03-18 NOTE — Patient Instructions (Addendum)
I have refilled your alprazolam, tramadol and hydrocodone  GET BACK ON YOUR CPAP!  USE THE alprazolam if you need it to tlerate the mask

## 2017-03-19 ENCOUNTER — Encounter: Payer: Self-pay | Admitting: Internal Medicine

## 2017-03-19 DIAGNOSIS — Z9884 Bariatric surgery status: Secondary | ICD-10-CM | POA: Insufficient documentation

## 2017-03-19 LAB — LIPID PANEL
CHOLESTEROL: 186 mg/dL (ref 0–200)
HDL: 73.9 mg/dL (ref >39.00)
LDL Cholesterol: 103 mg/dL — ABNORMAL HIGH (ref 0–99)
NONHDL: 112.52
Total CHOL/HDL Ratio: 3
Triglycerides: 50 mg/dL (ref 0.0–149.0)
VLDL: 10 mg/dL (ref 0.0–40.0)

## 2017-03-19 LAB — CBC WITH DIFFERENTIAL/PLATELET
BASOS PCT: 0.8 % (ref 0.0–3.0)
Basophils Absolute: 0.1 10*3/uL (ref 0.0–0.1)
EOS PCT: 3 % (ref 0.0–5.0)
Eosinophils Absolute: 0.2 10*3/uL (ref 0.0–0.7)
HCT: 40.9 % (ref 36.0–46.0)
HEMOGLOBIN: 13.6 g/dL (ref 12.0–15.0)
LYMPHS ABS: 0.9 10*3/uL (ref 0.7–4.0)
Lymphocytes Relative: 13.2 % (ref 12.0–46.0)
MCHC: 33.3 g/dL (ref 30.0–36.0)
MCV: 87.2 fl (ref 78.0–100.0)
MONOS PCT: 7.5 % (ref 3.0–12.0)
Monocytes Absolute: 0.5 10*3/uL (ref 0.1–1.0)
Neutro Abs: 5 10*3/uL (ref 1.4–7.7)
Neutrophils Relative %: 75.5 % (ref 43.0–77.0)
Platelets: 216 10*3/uL (ref 150.0–400.0)
RBC: 4.69 Mil/uL (ref 3.87–5.11)
RDW: 14 % (ref 11.5–15.5)
WBC: 6.6 10*3/uL (ref 4.0–10.5)

## 2017-03-19 LAB — COMPREHENSIVE METABOLIC PANEL
ALBUMIN: 4.2 g/dL (ref 3.5–5.2)
ALK PHOS: 46 U/L (ref 39–117)
ALT: 16 U/L (ref 0–35)
AST: 18 U/L (ref 0–37)
BILIRUBIN TOTAL: 0.6 mg/dL (ref 0.2–1.2)
BUN: 23 mg/dL (ref 6–23)
CO2: 30 mEq/L (ref 19–32)
Calcium: 9.5 mg/dL (ref 8.4–10.5)
Chloride: 102 mEq/L (ref 96–112)
Creatinine, Ser: 1.14 mg/dL (ref 0.40–1.20)
GFR: 50.13 mL/min — AB (ref >60.00)
GLUCOSE: 87 mg/dL (ref 70–99)
POTASSIUM: 4 meq/L (ref 3.5–5.1)
Sodium: 138 mEq/L (ref 135–145)
TOTAL PROTEIN: 7.6 g/dL (ref 6.0–8.3)

## 2017-03-19 LAB — TSH: TSH: 0.06 u[IU]/mL — ABNORMAL LOW (ref 0.35–4.50)

## 2017-03-19 LAB — VITAMIN D 25 HYDROXY (VIT D DEFICIENCY, FRACTURES): VITD: 21.07 ng/mL — ABNORMAL LOW (ref 30.00–100.00)

## 2017-03-19 NOTE — Assessment & Plan Note (Signed)
Managed with prn vicodin not daily . Refill history confirmed via [place] Controlled Substance databas, accessed by me today..refill given.  NSAIDs contraindicated.

## 2017-03-19 NOTE — Assessment & Plan Note (Signed)
I have congratulated her in reduction of   BMI and encouraged  Continued weight loss with goal of 10% of body weigh over the next 6 months using a low glycemic index diet and regular exercise a minimum of 5 days per week.    

## 2017-03-19 NOTE — Assessment & Plan Note (Signed)
Not using it .   Discussed the long term history of OSA , the risks of long term damage to heart and the signs and symptoms attributable to OSA.  Advised patient to consider  Resuming use and use alprazolam to develop tolerance to mask.  

## 2017-03-19 NOTE — Assessment & Plan Note (Deleted)
Gastric band was done years ago, noted on prior chest x ray

## 2017-03-20 ENCOUNTER — Other Ambulatory Visit: Payer: Self-pay | Admitting: Internal Medicine

## 2017-03-20 MED ORDER — LEVOTHYROXINE SODIUM 175 MCG PO TABS: 175.0000 ug | ORAL_TABLET | Freq: Every day | ORAL | 0 refills | Status: DC

## 2017-04-10 NOTE — Telephone Encounter (Signed)
Scheduled same day as cpe 06/20/17. Pt aware

## 2017-04-10 NOTE — Telephone Encounter (Signed)
Left pt message asking to call Revonda Standard back directly at [phone] to schedule AWV. Thanks!   NOTE* Never had AWV before; cpe is already scheduled for 05/2017

## 2017-04-17 NOTE — Telephone Encounter (Signed)
Error

## 2017-05-23 ENCOUNTER — Other Ambulatory Visit: Payer: Self-pay | Admitting: Internal Medicine

## 2017-06-20 ENCOUNTER — Encounter: Payer: Medicare Other | Admitting: Internal Medicine

## 2017-06-20 ENCOUNTER — Ambulatory Visit: Payer: Medicare Other

## 2017-08-12 ENCOUNTER — Other Ambulatory Visit: Payer: Self-pay | Admitting: Internal Medicine

## 2017-09-11 ENCOUNTER — Other Ambulatory Visit: Payer: Self-pay | Admitting: Internal Medicine

## 2017-09-16 ENCOUNTER — Ambulatory Visit: Payer: Medicare Other | Admitting: Internal Medicine

## 2017-09-20 ENCOUNTER — Other Ambulatory Visit: Payer: Self-pay

## 2017-09-20 NOTE — Telephone Encounter (Addendum)
Alprazolam Refilled: 03/18/2017  Tramadol Refilled: 03/18/2017  Last OV: 03/18/2017 Next OV: 11/29/2017

## 2017-09-20 NOTE — Telephone Encounter (Signed)
Please call pt

## 2017-09-20 NOTE — Telephone Encounter (Signed)
Spoke with pt and she stated that she has enough of both to get her through the weekend until Dr .Darrick Huntsmanullo gets back from vacation.

## 2017-09-23 MED ORDER — ALPRAZOLAM 0.25 MG PO TABS: 0.2500 mg | ORAL_TABLET | Freq: Every day | ORAL | 3 refills | Status: DC | PRN

## 2017-09-23 MED ORDER — TRAMADOL HCL 50 MG PO TABS: 50.0000 mg | ORAL_TABLET | Freq: Two times a day (BID) | ORAL | 1 refills | Status: DC | PRN

## 2017-09-23 NOTE — Addendum Note (Signed)
Addended by: Sherlene ShamsULLO, TERESA L on: 09/23/2017 01:41 PM   Modules accepted: Orders

## 2017-09-23 NOTE — Telephone Encounter (Signed)
Refills printed.  

## 2017-09-24 NOTE — Telephone Encounter (Signed)
Printed, signed and faxed.  

## 2017-10-11 ENCOUNTER — Other Ambulatory Visit: Payer: Self-pay | Admitting: Internal Medicine

## 2017-11-10 ENCOUNTER — Other Ambulatory Visit: Payer: Self-pay | Admitting: Internal Medicine

## 2017-11-27 ENCOUNTER — Telehealth: Payer: Self-pay | Admitting: Internal Medicine

## 2017-11-27 NOTE — Telephone Encounter (Signed)
Albuterol inhaler refill Last OV: 03/18/17 Last Refill: "historical med" on medlist Pharmacy:[facility] PCP: Dr Darrick Huntsman

## 2017-11-27 NOTE — Telephone Encounter (Signed)
Copied from CRM [id number]. Topic: Quick Communication - See Telephone Encounter >> Nov 27, 2017 10:01 AM Waymon Amato wrote: Pt is in [place] and has left her inhaler at home in [place]  and needs a replacement  Pt states she has an appt with tullo on Friday   [facility] [address]   Best number [phone]

## 2017-11-28 MED ORDER — ALBUTEROL SULFATE HFA 108 (90 BASE) MCG/ACT IN AERS: 3.0000 | INHALATION_SPRAY | Freq: Four times a day (QID) | RESPIRATORY_TRACT | 0 refills | Status: AC

## 2017-11-28 NOTE — Telephone Encounter (Signed)
Yes, I have sent it to local [facility]

## 2017-11-28 NOTE — Telephone Encounter (Signed)
Yes, I have sent it to local [facility]. MyChart message sent

## 2017-11-28 NOTE — Telephone Encounter (Signed)
Spoke with pt to let her know that her prescription has been sent to the [facility] on [address] near [employer]. Pt gave a verbal understanding.

## 2017-11-28 NOTE — Telephone Encounter (Signed)
This is a historical medication is it okay to refill for pt?

## 2017-11-28 NOTE — Telephone Encounter (Signed)
Please advise 

## 2017-11-29 ENCOUNTER — Encounter: Payer: Self-pay | Admitting: Internal Medicine

## 2017-11-29 ENCOUNTER — Ambulatory Visit (INDEPENDENT_AMBULATORY_CARE_PROVIDER_SITE_OTHER): Payer: Medicare Other | Admitting: Internal Medicine

## 2017-11-29 VITALS — BP 118/76 | HR 78 | Temp 98.3°F | Resp 16 | Ht 66.0 in | Wt 301.0 lb

## 2017-11-29 DIAGNOSIS — J449 Chronic obstructive pulmonary disease, unspecified: Secondary | ICD-10-CM

## 2017-11-29 DIAGNOSIS — R944 Abnormal results of kidney function studies: Secondary | ICD-10-CM | POA: Diagnosis not present

## 2017-11-29 DIAGNOSIS — Z1231 Encounter for screening mammogram for malignant neoplasm of breast: Secondary | ICD-10-CM | POA: Diagnosis not present

## 2017-11-29 DIAGNOSIS — Z1239 Encounter for other screening for malignant neoplasm of breast: Secondary | ICD-10-CM

## 2017-11-29 DIAGNOSIS — M25562 Pain in left knee: Secondary | ICD-10-CM | POA: Diagnosis not present

## 2017-11-29 DIAGNOSIS — M25561 Pain in right knee: Secondary | ICD-10-CM | POA: Diagnosis not present

## 2017-11-29 DIAGNOSIS — E039 Hypothyroidism, unspecified: Secondary | ICD-10-CM | POA: Diagnosis not present

## 2017-11-29 DIAGNOSIS — E559 Vitamin D deficiency, unspecified: Secondary | ICD-10-CM

## 2017-11-29 DIAGNOSIS — I10 Essential (primary) hypertension: Secondary | ICD-10-CM

## 2017-11-29 DIAGNOSIS — J4489 Other specified chronic obstructive pulmonary disease: Secondary | ICD-10-CM

## 2017-11-29 DIAGNOSIS — E66813 Obesity, class 3: Secondary | ICD-10-CM

## 2017-11-29 DIAGNOSIS — G8929 Other chronic pain: Secondary | ICD-10-CM

## 2017-11-29 DIAGNOSIS — E782 Mixed hyperlipidemia: Secondary | ICD-10-CM | POA: Diagnosis not present

## 2017-11-29 DIAGNOSIS — R7989 Other specified abnormal findings of blood chemistry: Secondary | ICD-10-CM

## 2017-11-29 DIAGNOSIS — Z0001 Encounter for general adult medical examination with abnormal findings: Secondary | ICD-10-CM | POA: Diagnosis not present

## 2017-11-29 DIAGNOSIS — Z0000 Encounter for general adult medical examination without abnormal findings: Secondary | ICD-10-CM

## 2017-11-29 DIAGNOSIS — Z7901 Long term (current) use of anticoagulants: Secondary | ICD-10-CM

## 2017-11-29 LAB — CBC WITH DIFFERENTIAL/PLATELET
BASOS PCT: 1 % (ref 0.0–3.0)
Basophils Absolute: 0 10*3/uL (ref 0.0–0.1)
Eosinophils Absolute: 0.2 10*3/uL (ref 0.0–0.7)
Eosinophils Relative: 5.3 % — ABNORMAL HIGH (ref 0.0–5.0)
HCT: 38.7 % (ref 36.0–46.0)
Hemoglobin: 12.9 g/dL (ref 12.0–15.0)
Lymphocytes Relative: 21.3 % (ref 12.0–46.0)
Lymphs Abs: 0.8 10*3/uL (ref 0.7–4.0)
MCHC: 33.2 g/dL (ref 30.0–36.0)
MCV: 86.4 fl (ref 78.0–100.0)
MONO ABS: 0.6 10*3/uL (ref 0.1–1.0)
Monocytes Relative: 15.8 % — ABNORMAL HIGH (ref 3.0–12.0)
NEUTROS ABS: 2.2 10*3/uL (ref 1.4–7.7)
NEUTROS PCT: 56.6 % (ref 43.0–77.0)
PLATELETS: 197 10*3/uL (ref 150.0–400.0)
RBC: 4.48 Mil/uL (ref 3.87–5.11)
RDW: 14.1 % (ref 11.5–15.5)
WBC: 3.8 10*3/uL — ABNORMAL LOW (ref 4.0–10.5)

## 2017-11-29 LAB — COMPREHENSIVE METABOLIC PANEL
ALK PHOS: 54 U/L (ref 39–117)
ALT: 20 U/L (ref 0–35)
AST: 20 U/L (ref 0–37)
Albumin: 4.1 g/dL (ref 3.5–5.2)
BILIRUBIN TOTAL: 0.5 mg/dL (ref 0.2–1.2)
BUN: 24 mg/dL — ABNORMAL HIGH (ref 6–23)
CO2: 32 meq/L (ref 19–32)
CREATININE: 1.32 mg/dL — AB (ref 0.40–1.20)
Calcium: 9.1 mg/dL (ref 8.4–10.5)
Chloride: 101 mEq/L (ref 96–112)
GFR: 42.24 mL/min — AB (ref >60.00)
GLUCOSE: 89 mg/dL (ref 70–99)
Potassium: 4.2 mEq/L (ref 3.5–5.1)
Sodium: 141 mEq/L (ref 135–145)
TOTAL PROTEIN: 7.4 g/dL (ref 6.0–8.3)

## 2017-11-29 LAB — LIPID PANEL
CHOLESTEROL: 198 mg/dL (ref 0–200)
HDL: 71.2 mg/dL (ref >39.00)
LDL CALC: 108 mg/dL — AB (ref 0–99)
NonHDL: 126.4
Total CHOL/HDL Ratio: 3
Triglycerides: 90 mg/dL (ref 0.0–149.0)
VLDL: 18 mg/dL (ref 0.0–40.0)

## 2017-11-29 LAB — VITAMIN D 25 HYDROXY (VIT D DEFICIENCY, FRACTURES): VITD: 20.2 ng/mL — ABNORMAL LOW (ref 30.00–100.00)

## 2017-11-29 LAB — TSH: TSH: <0.01 u[IU]/mL — ABNORMAL LOW (ref 0.35–4.50)

## 2017-11-29 MED ORDER — TRAMADOL HCL 50 MG PO TABS: 50.0000 mg | ORAL_TABLET | Freq: Two times a day (BID) | ORAL | 2 refills | Status: DC | PRN

## 2017-11-29 MED ORDER — BENZONATATE 200 MG PO CAPS: 200.0000 mg | ORAL_CAPSULE | Freq: Three times a day (TID) | ORAL | 1 refills | Status: DC | PRN

## 2017-11-29 MED ORDER — HYDROCOD POLST-CPM POLST ER 10-8 MG/5ML PO SUER: 5.0000 mL | Freq: Every evening | ORAL | 0 refills | Status: DC | PRN

## 2017-11-29 MED ORDER — AMOXICILLIN-POT CLAVULANATE 875-125 MG PO TABS: 1.0000 | ORAL_TABLET | Freq: Two times a day (BID) | ORAL | 0 refills | Status: DC

## 2017-11-29 MED ORDER — PREDNISONE 10 MG PO TABS: ORAL_TABLET | ORAL | 0 refills | Status: DC

## 2017-11-29 MED ORDER — HYDROCODONE-ACETAMINOPHEN 5-325 MG PO TABS: 1.0000 | ORAL_TABLET | Freq: Four times a day (QID) | ORAL | 0 refills | Status: DC | PRN

## 2017-11-29 NOTE — Patient Instructions (Addendum)
I am treating you for  Bronchitis s which is a complication from your viral infection    I am prescribing an antibiotic (augmentin ) and a prednisone taper  To manage the infection and the inflammation in your bronchial tubes    flush your sinuses twice daily with Simply Saline (do over the sink because if you do it right you will spit out globs of mucus)  Use benzonatate capsules   FOR THE daytime cough.  Gargle with salt water as needed for sore throat.   Use the tussionex liquid cough syrup at night for  severe cough .  It has vicodin in it so DO NOT SHARE WITH OTHERS AND DO NOT COMBINE WITH TRAMADOL OR VICODIN    On days a 3 and 4 of augmentin.  Take 1/2 of normal coumadin dose   Please take a probiotic or the generic version of  ( Align, Floraque or Culturelle) while you are on the antibiotic to prevent a serious antibiotic associated diarrhea  Called clostirudium dificile colitis and a vaginal yeast infection    MAMMOGRAM HAS BEEN ORDERED TO BE DONE AT [facility]

## 2017-11-29 NOTE — Progress Notes (Signed)
Patient ID: Maria Torres, female    DOB: 1945-11-30  Age: 72 y.o. MRN: [id number]  The patient is here for annual preventive  examination and management of other chronic and acute problems.    No mammogram since   2014   [place]  Colonoscopy 2013  [facility]  Normal   The risk factors are reflected in the social history.  The roster of all physicians providing medical care to patient - is listed in the Snapshot section of the chart.  Activities of daily living:  The patient is 100% independent in all ADLs: dressing, toileting, feeding as well as independent mobility  Home safety : The patient has smoke detectors in the home. They wear seatbelts.  There are no firearms at home. There is no violence in the home.   There is no risks for hepatitis, STDs or HIV. There is no   history of blood transfusion. They have no travel history to infectious disease endemic areas of the world.  The patient has seen their dentist in the last six month. They have seen their eye doctor in the last year. They admit to slight hearing difficulty with regard to whispered voices and some television programs.  They have deferred audiologic testing in the last year.  They do not  have excessive sun exposure. Discussed the need for sun protection: hats, long sleeves and use of sunscreen if there is significant sun exposure.   Diet: the importance of a healthy diet is discussed. They do have a healthy diet.  The benefits of regular aerobic exercise were discussed. She walks  7 days per week for  20 minutes.   Depression screen: there are no signs or vegative symptoms of depression- irritability, change in appetite, anhedonia, sadness/tearfullness.  Cognitive assessment: the patient manages all their financial and personal affairs and is actively engaged. They could relate day,date,year and events; recalled 2/3 objects at 3 minutes; performed clock-face test normally.  The following portions of the patient's history  were reviewed and updated as appropriate: allergies, current medications, past family history, past medical history,  past surgical history, past social history  and problem list.  Visual acuity was not assessed per patient preference since she has regular follow up with her ophthalmologist. Hearing and body mass index were assessed and reviewed.   During the course of the visit the patient was educated and counseled about appropriate screening and preventive services including : fall prevention , diabetes screening, nutrition counseling, colorectal cancer screening, and recommended immunizations.    CC: The primary encounter diagnosis was Breast cancer screening. Diagnoses of Hypothyroidism, unspecified type, Mixed hyperlipidemia, Long term current use of anticoagulant therapy, Vitamin D deficiency, Bronchitis with airway obstruction (HCC), Obesity, Class III, BMI 40-49.9 (morbid obesity) (HCC), Chronic pain of both knees, Routine general medical examination at a health care facility, Benign hypertension, Decreased GFR, and Abnormal TSH were also pertinent to this visit.  Cough productive of green sputum accompanied by wheezing and dyspnea  Symptoms started 3 days ago with sinus congestion and cough.  Drinking Theraflu,  Had an episode of  mucous plugging that choked her,  Was percussed by a friend and coughed up a  huge plug.  Has been using albuterol MDI   Obesity:  Has lost 15 lbs since August .current weight is 391 lbs  Personal best is 285 in December 2015, followed by her personal worst of  332 in Oct 2016. Weight Watchers,  Walking daily   MRI of left arm  planned  by Sports med/Ortho  for persistent pain after a yanking incident  That occurred while walking neighbor's dog A month ago   Chronic coumadin use:  Not getting INr's often enough per cardiology 1.9,  Goal 2 to 2.5   Takes  5 mg   6 days and 10 mg 1 day     History Maria Torres has a past medical history of Anemia, Aortic stenosis,  Arthritis, Cervicalgia, CHF (congestive heart failure) (HCC), GERD (gastroesophageal reflux disease), Headache(784.0), glaucoma, Hyperlipidemia, Hypertension, Mitral valve disorder, Morbid obesity (HCC), Obstructive sleep apnea on CPAP, and Thyroid disease.   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   Her family history includes COPD in her mother; Cancer in her brother, father, and sister; Coronary artery disease in her mother; Heart disease (age of onset: 73) in her mother; Hyperlipidemia in her mother; Obesity in her sister; Pulmonary embolism in her father.She reports that she has never smoked. She has never used smokeless tobacco. She reports that she drinks alcohol. She reports that she does not use drugs.  Outpatient Medications Prior to Visit  Medication Sig Dispense Refill  . albuterol (PROVENTIL HFA;VENTOLIN HFA) 108 (90 Base) MCG/ACT inhaler Inhale 3 puffs into the lungs 4 (four) times daily. 6.7 g 0  . albuterol (PROVENTIL) (2.5 MG/3ML) 0.083% nebulizer solution Take 3 mLs (2.5 mg total) by nebulization every 4 (four) hours as needed for wheezing or shortness of breath. 150 mL 5  . ALPRAZolam (XANAX) 0.25 MG tablet Take 1 tablet (0.25 mg total) by mouth daily as needed for sleep or anxiety. 30 tablet 3  . amoxicillin (AMOXIL) 250 MG capsule Take 250 mg by mouth 3 (three) times daily. Before dental procedures    . budesonide-formoterol (SYMBICORT) 160-4.5 MCG/ACT inhaler Inhale 2 puffs into the lungs 2 (two) times daily. 1 Inhaler 12  . carvedilol (COREG) 3.125 MG tablet Take 1 tablet (3.125 mg total) by mouth every 12 (twelve) hours. 180 tablet 1  . citalopram (CELEXA) 40 MG  tablet Take 1 tablet (40 mg total) by mouth daily. 90 tablet 1  . hydrocortisone 2.5 % cream Apply topically 2 (two) times daily. 30 g 0  . levothyroxine (SYNTHROID, LEVOTHROID) 175 MCG tablet Take 1 tablet (175 mcg total) by mouth daily. 30 tablet 0  . losartan (COZAAR) 50 MG tablet Take 1 tablet (50 mg total) by mouth daily. 90 tablet 3  . torsemide (DEMADEX) 20 MG tablet Take 1 tablet (20 mg total) by mouth daily. 30 tablet 0  . triamcinolone cream (KENALOG) 0.1 % Apply 1 application topically 2 (two) times daily. 453.6 g 1  . VESICARE 10 MG tablet Take 1 tablet (10 mg total) by mouth daily. 90 tablet 0  . warfarin (COUMADIN) 10 MG tablet Take 1 tablet (10 mg total) by mouth See admin instructions. 60 tablet 5  . HYDROcodone-acetaminophen (NORCO/VICODIN) 5-325 MG tablet Take 1 tablet by mouth 2 (two) times daily. As needed for severe pain 60 tablet 0  . traMADol (ULTRAM) 50 MG tablet Take 1 tablet (50 mg total) by mouth every 12 (twelve) hours as needed. 60 tablet 1   No facility-administered medications prior to visit.     Review of Systems   Patient denies headache, fevers, malaise, unintentional weight loss, skin rash,  eye pain, sinus congestion and sinus pain, sore throat, dysphagia,  hemoptysis , , dyspnea, wheezing, chest pain, palpitations, orthopnea, edema, abdominal pain, nausea, melena, diarrhea, constipation, flank pain, dysuria, hematuria, urinary  Frequency, nocturia, numbness, tingling, seizures,  Focal weakness, Loss of consciousness,  Tremor, insomnia, depression, anxiety, and suicidal ideation.      Objective:  BP 118/76 (BP Location: Left Arm, Patient Position: Sitting, Cuff Size: Large)   Pulse 78   Temp 98.3 F (36.8 C) (Oral)   Resp 16   Ht 5\' 6"  (1.676 m)   Wt (!) 301 lb (136.5 kg)   LMP 08/31/2013   SpO2 94%   BMI 48.58 kg/m   Physical Exam   General appearance: alert, cooperative and appears stated age Head: Normocephalic, without obvious abnormality,  atraumatic Eyes: conjunctivae/corneas clear. PERRL, EOM's intact. Fundi benign. Ears: normal TM's and external ear canals both ears Nose: Nares normal. Septum midline. Mucosa normal. No drainage or sinus tenderness. Throat: lips, mucosa, and tongue normal; teeth and gums normal Neck: no adenopathy, no carotid bruit, no JVD, supple, symmetrical, trachea midline and thyroid not enlarged, symmetric, no tenderness/mass/nodules Lungs: clear to auscultation bilaterally Breasts: pendulous, no masses or tenderness Heart: regular rate and rhythm, S1, S2 normal, no murmur, click, rub or gallop Abdomen: soft, non-tender; bowel sounds normal; no masses,  no organomegaly Extremities: extremities normal, atraumatic, no cyanosis or edema Pulses: 2+ and symmetric Skin: Skin color, texture, turgor normal. No rashes or lesions Neurologic: Alert and oriented X 3, normal strength and tone. Normal symmetric reflexes. Normal coordination and gait.      Assessment & Plan:   Problem List Items Addressed This Visit    Long term current use of anticoagulant therapy   Relevant Orders   CBC with Differential/Platelet (Completed)   Comprehensive metabolic panel (Completed)   Hyperlipidemia   Relevant Orders   Lipid panel (Completed)   Vitamin D deficiency    Recurrent,  Due to prior bariatric procedure and use of sunscreen.  Continue weekly megadose .       Relevant Orders   VITAMIN D 25 Hydroxy (Vit-D Deficiency, Fractures) (Completed)   Routine general medical examination at a health care facility    Annual comprehensive preventive exam was done as well as an evaluation and management of chronic conditions .  During the course of the visit the patient was educated and counseled about appropriate screening and preventive services including :  diabetes screening, lipid analysis with projected  10 year  risk for CAD , nutrition counseling, breast, cervical and colorectal cancer screening, and recommended  immunizations.  Printed recommendations for health maintenance screenings wans give      Obesity, Class III, BMI 40-49.9 (morbid obesity) (HCC)    .I have congratulated her in reduction of   BMI and encouraged  Continued weight loss with goal of 10% of body weigh over the next 6 months using a low glycemic index diet and regular exercise a minimum of 5 days per week.        Knee pain, bilateral    Managed with tramadol and rare use of  vicodin . Refill history confirmed via [place] Controlled Substance databas, accessed by me today..refill given.  NSAIDs contraindicated dur to chronic coumadin use.        Hypothyroidism    Thyroid is overactive on current dose of levothyroxine 175 mcg.  Will recheck in 2 weeks with BMET>   Lab Results  Component Value Date   TSH <0.01 (L) 11/29/2017  Relevant Orders   TSH (Completed)   Bronchitis with airway obstruction (HCC)    With wheezing, and recent expulsion of a mucous plug reported by patient after choking. Continue guaifenesin, adding augmentin and prednisone taper .  Tussionex for nighttime cough.       Benign hypertension    Well controlled on current regimen.s slight decline in GFR noted.  ,  Needs repeat in 2 weeks .  Lab Results  Component Value Date   CREATININE 1.32 (H) 11/29/2017   Lab Results  Component Value Date   NA 141 11/29/2017   K 4.2 11/29/2017   CL 101 11/29/2017   CO2 32 11/29/2017          Other Visit Diagnoses    Breast cancer screening    -  Primary   Relevant Orders   MM DIGITAL SCREENING BILATERAL   Decreased GFR       Relevant Orders   Basic metabolic panel   Abnormal TSH       Relevant Orders   TSH   T4, free      I have discontinued Darrin Nipper. Maria Torres's traMADol and traMADol. I have also changed her traMADol. Additionally, I am having her start on amoxicillin-clavulanate, predniSONE, benzonatate, chlorpheniramine-HYDROcodone, and ergocalciferol. Lastly, I am having her maintain her  amoxicillin, budesonide-formoterol, albuterol, losartan, hydrocortisone, triamcinolone cream, warfarin, torsemide, carvedilol, citalopram, ALPRAZolam, VESICARE, levothyroxine, albuterol, and HYDROcodone-acetaminophen.  Meds ordered this encounter  Medications  . amoxicillin-clavulanate (AUGMENTIN) 875-125 MG tablet    Sig: Take 1 tablet by mouth 2 (two) times daily.    Dispense:  14 tablet    Refill:  0  . predniSONE (DELTASONE) 10 MG tablet    Sig: 6 tablets on Day 1 , then reduce by 1 tablet daily until gone    Dispense:  21 tablet    Refill:  0  . benzonatate (TESSALON) 200 MG capsule    Sig: Take 1 capsule (200 mg total) by mouth 3 (three) times daily as needed for cough.    Dispense:  60 capsule    Refill:  1  . chlorpheniramine-HYDROcodone (TUSSIONEX PENNKINETIC ER) 10-8 MG/5ML SUER    Sig: Take 5 mLs by mouth at bedtime as needed for cough.    Dispense:  180 mL    Refill:  0  . DISCONTD: traMADol (ULTRAM) 50 MG tablet    Sig: Take 1 tablet (50 mg total) by mouth every 12 (twelve) hours as needed.    Dispense:  30 tablet    Refill:  2  . DISCONTD: HYDROcodone-acetaminophen (NORCO/VICODIN) 5-325 MG tablet    Sig: Take 1 tablet by mouth every 6 (six) hours as needed for moderate pain. As needed for severe pain    Dispense:  28 tablet    Refill:  0  . DISCONTD: traMADol (ULTRAM) 50 MG tablet    Sig: Take 1 tablet (50 mg total) by mouth every 12 (twelve) hours as needed.    Dispense:  30 tablet    Refill:  2  . HYDROcodone-acetaminophen (NORCO/VICODIN) 5-325 MG tablet    Sig: Take 1 tablet by mouth every 6 (six) hours as needed for moderate pain. As needed for severe pain    Dispense:  28 tablet    Refill:  0  . traMADol (ULTRAM) 50 MG tablet    Sig: Take 1 tablet (50 mg total) by mouth every 12 (twelve) hours as needed for moderate pain.    Dispense:  30 tablet  Refill:  2  . ergocalciferol (DRISDOL) 50000 units capsule    Sig: Take 1 capsule (50,000 Units total) by  mouth once a week.    Dispense:  12 capsule    Refill:  0    Medications Discontinued During This Encounter  Medication Reason  . traMADol (ULTRAM) 50 MG tablet Reorder  . HYDROcodone-acetaminophen (NORCO/VICODIN) 5-325 MG tablet Reorder  . traMADol (ULTRAM) 50 MG tablet Reorder  . HYDROcodone-acetaminophen (NORCO/VICODIN) 5-325 MG tablet Reorder  . traMADol (ULTRAM) 50 MG tablet Reorder    Follow-up: No follow-ups on file.   Sherlene Shams, MD

## 2017-12-01 ENCOUNTER — Encounter: Payer: Self-pay | Admitting: Internal Medicine

## 2017-12-01 DIAGNOSIS — J449 Chronic obstructive pulmonary disease, unspecified: Secondary | ICD-10-CM | POA: Insufficient documentation

## 2017-12-01 DIAGNOSIS — E559 Vitamin D deficiency, unspecified: Secondary | ICD-10-CM | POA: Insufficient documentation

## 2017-12-01 MED ORDER — ERGOCALCIFEROL 1.25 MG (50000 UT) PO CAPS: 50000.0000 [IU] | ORAL_CAPSULE | ORAL | 0 refills | Status: DC

## 2017-12-01 NOTE — Assessment & Plan Note (Signed)
Recurrent,  Due to prior bariatric procedure and use of sunscreen.  Continue weekly megadose .

## 2017-12-01 NOTE — Assessment & Plan Note (Signed)
Annual comprehensive preventive exam was done as well as an evaluation and management of chronic conditions .  During the course of the visit the patient was educated and counseled about appropriate screening and preventive services including :  diabetes screening, lipid analysis with projected  10 year  risk for CAD , nutrition counseling, breast, cervical and colorectal cancer screening, and recommended immunizations.  Printed recommendations for health maintenance screenings wans give

## 2017-12-01 NOTE — Assessment & Plan Note (Signed)
Thyroid is overactive on current dose of levothyroxine 175 mcg.  Will recheck in 2 weeks with BMET>   Lab Results  Component Value Date   TSH <0.01 (L) 11/29/2017

## 2017-12-01 NOTE — Assessment & Plan Note (Addendum)
With wheezing, and recent expulsion of a mucous plug reported by patient after choking. Continue guaifenesin, adding augmentin and prednisone taper .  Tussionex for nighttime cough.

## 2017-12-01 NOTE — Assessment & Plan Note (Signed)
Managed with tramadol and rare use of  vicodin . Refill history confirmed via [place] Controlled Substance databas, accessed by me today..refill given.  NSAIDs contraindicated dur to chronic coumadin use.

## 2017-12-01 NOTE — Assessment & Plan Note (Signed)
Well controlled on current regimen.s slight decline in GFR noted.  ,  Needs repeat in 2 weeks .  Lab Results  Component Value Date   CREATININE 1.32 (H) 11/29/2017   Lab Results  Component Value Date   NA 141 11/29/2017   K 4.2 11/29/2017   CL 101 11/29/2017   CO2 32 11/29/2017

## 2017-12-01 NOTE — Assessment & Plan Note (Signed)
I have congratulated her in reduction of   BMI and encouraged  Continued weight loss with goal of 10% of body weigh over the next 6 months using a low glycemic index diet and regular exercise a minimum of 5 days per week.    

## 2017-12-06 ENCOUNTER — Telehealth: Payer: Self-pay | Admitting: Internal Medicine

## 2017-12-06 NOTE — Telephone Encounter (Signed)
Attempted to contact pt, her mail box is full. Unable to leave message. Need clarification on how may pills of the Vesicare that she has left. Too early for refill.

## 2017-12-06 NOTE — Telephone Encounter (Unsigned)
Copied from CRM [id number]. Topic: Quick Communication - Rx Refill/Question >> Dec 06, 2017  1:38 PM Marylen Ponto wrote: Medication: VESICARE 10 MG tablet         (DAW1)   Preferred Pharmacy (with phone number or street name): [facility] - [place], [place] - [address] [phone] (Phone) [phone] (Fax)  Agent: Please be advised that RX refills may take up to 3 business days. We ask that you follow-up with your pharmacy.

## 2017-12-10 ENCOUNTER — Other Ambulatory Visit: Payer: Self-pay | Admitting: Internal Medicine

## 2018-01-09 ENCOUNTER — Other Ambulatory Visit: Payer: Self-pay | Admitting: Internal Medicine

## 2018-01-09 ENCOUNTER — Telehealth: Payer: Self-pay | Admitting: Internal Medicine

## 2018-01-09 MED ORDER — SOLIFENACIN SUCCINATE 10 MG PO TABS: 10.0000 mg | ORAL_TABLET | Freq: Every day | ORAL | 0 refills | Status: DC

## 2018-01-09 NOTE — Telephone Encounter (Signed)
Please advise 

## 2018-01-09 NOTE — Telephone Encounter (Signed)
Copied from CRM [id number]. Topic: General - Other >> Jan 09, 2018  9:49 AM Leafy Roobinson, Norma J wrote: Reason for CRM: pt is calling and needs name brand vesicare 10 mg # 30 not generic  sent to [facility] [place] .

## 2018-01-09 NOTE — Telephone Encounter (Signed)
Medication has been sent as brand name only to pharmacy [facility] per pt request.

## 2018-03-05 ENCOUNTER — Other Ambulatory Visit: Payer: Self-pay | Admitting: Internal Medicine

## 2018-03-10 ENCOUNTER — Other Ambulatory Visit: Payer: Self-pay | Admitting: Internal Medicine

## 2018-03-12 ENCOUNTER — Other Ambulatory Visit: Payer: Self-pay

## 2018-03-12 MED ORDER — TRAMADOL HCL 50 MG PO TABS: 50.0000 mg | ORAL_TABLET | Freq: Two times a day (BID) | ORAL | 2 refills | Status: DC | PRN

## 2018-03-12 NOTE — Telephone Encounter (Signed)
Refilled: 11/29/2017 Last OV: 11/29/2017 Next OV: 06/06/2018

## 2018-03-12 NOTE — Telephone Encounter (Signed)
Sent to [facility] in [place],  MyChart message sent

## 2018-03-31 ENCOUNTER — Telehealth: Payer: Self-pay | Admitting: Internal Medicine

## 2018-03-31 ENCOUNTER — Other Ambulatory Visit: Payer: Self-pay | Admitting: *Deleted

## 2018-03-31 MED ORDER — SOLIFENACIN SUCCINATE 10 MG PO TABS: 10.0000 mg | ORAL_TABLET | Freq: Every day | ORAL | 0 refills | Status: DC

## 2018-03-31 NOTE — Telephone Encounter (Signed)
Rx refilled per protocol- LOV: 11/29/17 to continue.  #90 no additional patient ha appointment 06/06/18

## 2018-03-31 NOTE — Telephone Encounter (Unsigned)
Copied from CRM [id number]. Topic: Quick Communication - Rx Refill/Question >> Mar 31, 2018 10:38 AM Marylen Ponto wrote: Medication: solifenacin (VESICARE) 10 MG tablet  Has the patient contacted their pharmacy? yes   Preferred Pharmacy (with phone number or street name): [facility] - [place], [place] - [address] [phone] (Phone) [phone] (Fax)  Agent: Please be advised that RX refills may take up to 3 business days. We ask that you follow-up with your pharmacy.

## 2018-04-21 ENCOUNTER — Encounter: Payer: Self-pay | Admitting: Internal Medicine

## 2018-05-21 ENCOUNTER — Other Ambulatory Visit: Payer: Self-pay | Admitting: Internal Medicine

## 2018-05-30 ENCOUNTER — Other Ambulatory Visit: Payer: Self-pay | Admitting: Internal Medicine

## 2018-06-06 ENCOUNTER — Ambulatory Visit: Payer: Medicare Other | Admitting: Internal Medicine

## 2018-06-06 ENCOUNTER — Other Ambulatory Visit: Payer: Self-pay | Admitting: Internal Medicine

## 2018-06-10 ENCOUNTER — Other Ambulatory Visit: Payer: Self-pay | Admitting: Internal Medicine

## 2018-07-08 ENCOUNTER — Other Ambulatory Visit: Payer: Self-pay | Admitting: Internal Medicine

## 2018-08-07 ENCOUNTER — Other Ambulatory Visit: Payer: Self-pay | Admitting: Internal Medicine

## 2018-08-08 ENCOUNTER — Ambulatory Visit: Payer: Medicare Other | Admitting: Internal Medicine

## 2018-08-29 ENCOUNTER — Other Ambulatory Visit: Payer: Self-pay | Admitting: Internal Medicine

## 2018-10-03 ENCOUNTER — Ambulatory Visit: Payer: Medicare Other | Admitting: Internal Medicine

## 2018-10-06 ENCOUNTER — Other Ambulatory Visit: Payer: Self-pay | Admitting: Internal Medicine

## 2018-10-24 ENCOUNTER — Telehealth: Payer: Self-pay | Admitting: Internal Medicine

## 2018-10-24 MED ORDER — SOLIFENACIN SUCCINATE 10 MG PO TABS: 10.0000 mg | ORAL_TABLET | Freq: Every day | ORAL | 1 refills | Status: DC

## 2018-10-24 NOTE — Telephone Encounter (Signed)
Copied from CRM [id number]. Topic: Quick Communication - Rx Refill/Question >> Oct 24, 2018  9:29 AM Jaquita Rector A wrote: Medication: VESICARE 10 MG tablet  Per patient insurance will only pay for generic  Has the patient contacted their pharmacy? Yes.   (Agent: If no, request that the patient contact the pharmacy for the refill.) (Agent: If yes, when and what did the pharmacy advise?)  Preferred Pharmacy (with phone number or street name): [id number] - [place], [place] - [address] & [phone] (Phone) [phone] (Fax)    Agent: Please be advised that RX refills may take up to 3 business days. We ask that you follow-up with your pharmacy.

## 2018-10-24 NOTE — Telephone Encounter (Signed)
She can use Imodium and set her up a web visit

## 2018-10-24 NOTE — Telephone Encounter (Signed)
Is there a generic for Vesicare

## 2018-10-24 NOTE — Telephone Encounter (Signed)
Spoke with pt to let her know that medication was sent in. While on the phone the pt stated that she is having an "anxious" stomach feels like "butterflies". She also stated that when she eats something she has diarrhea. Pt is wanting to know if there is something that she can take to help with this?

## 2018-10-24 NOTE — Telephone Encounter (Signed)
Generic sent to mail order

## 2018-10-24 NOTE — Telephone Encounter (Signed)
Spoke with pt and informed her of the medication that she can take to help with the GI symptoms. Pt is scheduled for a virtual visit.

## 2018-10-27 ENCOUNTER — Encounter: Payer: Self-pay | Admitting: Internal Medicine

## 2018-10-27 ENCOUNTER — Ambulatory Visit (INDEPENDENT_AMBULATORY_CARE_PROVIDER_SITE_OTHER): Payer: Medicare Other | Admitting: Internal Medicine

## 2018-10-27 DIAGNOSIS — E034 Atrophy of thyroid (acquired): Secondary | ICD-10-CM

## 2018-10-27 DIAGNOSIS — Z7901 Long term (current) use of anticoagulants: Secondary | ICD-10-CM | POA: Diagnosis not present

## 2018-10-27 DIAGNOSIS — R197 Diarrhea, unspecified: Secondary | ICD-10-CM | POA: Diagnosis not present

## 2018-10-27 MED ORDER — TRAMADOL HCL 50 MG PO TABS: ORAL_TABLET | ORAL | 2 refills | Status: DC

## 2018-10-27 NOTE — Patient Instructions (Signed)
Taking an antibiotic can create an imbalance in the normal population of bacteria that live in the small intestine.  This imbalance can persist for 3 months.   Taking a probiotic ( Align, Floraque or Culturelle), the generic version of one of these over the counter medications, or an alternative form (kombucha,  Yogurt, or another dietary source) for a minimum of 3 weeks may help prevent a serious antibiotic associated diarrhea  Called clostridium dificile colitis that occurs when the bacteria population is altered .  Taking a probiotic may also prevent vaginitis due to yeast infections and can be continued indefinitely if you feel that it improves your digestion or your elimination (bowels).    Your INR may have gone up since you have been on a modified diet.  PLEASE CHECK IT SOON AND SEND THE RESULTS TO ME  Find out if the [facility] near you is allowing walk in lab draws and get their  Phone/fax number so I can send them an order for fasting labs to be done next week

## 2018-10-27 NOTE — Progress Notes (Signed)
Virtual Visit via Video Note  I connected with Maria Torres on 10/27/18 at  8:30 AM EDT by a video enabled telemedicine application and verified that I am speaking with the correct person using two identifiers.   I discussed the limitations of evaluation and management by telemedicine and the availability of in person appointments. The patient expressed understanding and agreed to proceed.  History of Present Illness:  1 week history of  "nervous stomach", also described as having  "butterflies in stomach." .  Fecal urgency, stool is yellow,  Semi formed. , has had soft stools alternating with liquid stools .  No change with dietary modification and limiting intake to  chicken broth and rice.  No appetite, but  Not nauseated , however, finds herself gagging with teeth brushing . Started after eating baked chicken one week ago, cooked at home,  Sister ate it too and is fine.  Drinking  Maximum one glass of malbec only one glass,  And not having any diarrhea overnight.   Has been taking several supplements. Taking elderberry syrup daily for the past  month ,  Daily  vitamin C and Emergence C   Last antibiotic use was  amoxcillin and prednisone about 3 weeks ago for URI with wheezing .  Did  not take a probiotic   Checking INR bi weekly for goal INR 2.0 to 3.0  Takes 10 mg 5 x week and 5 mg 2 x week.  Using tramadol as needed for headaches,  Not daily    Seen in [facility] ED Sept 2019 for right gastrocnemius tendon rupture that occurred while getting out of bed to manage a muscle cramp .  Felt a  pop . 6 x 4 x 10 cm hematoma suggested by US done at ER in  [facility]  Treated with cast and posterior splint ,  Now resolved completely.  No longer walking on the beach due to COVID 19 restrictions.  Walking at home around neighborhood   Observations/Objective:  General impression : alert, cooperative and articulate.  No signs of being in distress  HEENT:  No obvious signs of conjunctivitis,    Lungs: not short of breath ,  No cough, speaking in full sentences  Psych: affect normal, speech is articulate and non pressured .  Denies suicidal thoughts   Assessment and Plan:  Diarrhea in adult patient Advised to start taking a probiotic given her recent antibiotic use .  Symptoms are not currently suggestive of c dificile colitis.  Ok to use Imodium prn    Long term current use of anticoagulant therapy Warned patient that her INR has likely increased due to low oral intake for the past week; advised to check INR asap.  Current regimen is  10 mg daily x 5,  5 mg daily x 2  Hypothyroidism Thyroid has not been normalized in over two years.  It was  overactive on  levothyroxine 175 mcg last May and has not been rechecked due to patient's travel   Lab Results  Component Value Date   TSH <0.01 (L) 11/29/2017      Updated Medication List Outpatient Encounter Medications as of 10/27/2018  Medication Sig  . albuterol (PROVENTIL HFA;VENTOLIN HFA) 108 (90 Base) MCG/ACT inhaler Inhale 3 puffs into the lungs 4 (four) times daily.  Marland Kitchen albuterol (PROVENTIL) (2.5 MG/3ML) 0.083% nebulizer solution Take 3 mLs (2.5 mg total) by nebulization every 4 (four) hours as needed for wheezing or shortness of breath.  . ALPRAZolam (  XANAX) 0.25 MG tablet Take 1 tablet (0.25 mg total) by mouth daily as needed for sleep or anxiety.  . budesonide-formoterol (SYMBICORT) 160-4.5 MCG/ACT inhaler Inhale 2 puffs into the lungs 2 (two) times daily.  . carvedilol (COREG) 3.125 MG tablet Take 1 tablet (3.125 mg total) by mouth every 12 (twelve) hours.  . citalopram (CELEXA) 40 MG tablet Take 1 tablet (40 mg total) by mouth daily.  Marland Kitchen. HYDROcodone-acetaminophen (NORCO/VICODIN) 5-325 MG tablet Take 1 tablet by mouth every 6 (six) hours as needed for moderate pain. As needed for severe pain  . levothyroxine (SYNTHROID, LEVOTHROID) 175 MCG tablet Take 1 tablet (175 mcg total) by mouth daily.  Marland Kitchen. lisinopril (PRINIVIL,ZESTRIL)  5 MG tablet Take 5 mg by mouth daily.   . solifenacin (VESICARE) 10 MG tablet Take 1 tablet (10 mg total) by mouth daily.  Marland Kitchen. torsemide (DEMADEX) 20 MG tablet Take 1 tablet (20 mg total) by mouth daily.  . traMADol (ULTRAM) 50 MG tablet TAKE 1 TABLET(50 MG) BY MOUTH EVERY 12 HOURS AS NEEDED FOR MODERATE PAIN  . Vitamin D, Ergocalciferol, (DRISDOL) 1.25 MG (50000 UT) CAPS capsule TAKE 1 CAPSULE BY MOUTH 1 TIME A WEEK  . warfarin (COUMADIN) 10 MG tablet Take 1 tablet (10 mg total) by mouth See admin instructions. (Patient taking differently: Take 10 mg by mouth See admin instructions. )  . warfarin (COUMADIN) 5 MG tablet Take 5 mg by mouth daily.  . [DISCONTINUED] traMADol (ULTRAM) 50 MG tablet TAKE 1 TABLET(50 MG) BY MOUTH EVERY 12 HOURS AS NEEDED FOR MODERATE PAIN  . amoxicillin (AMOXIL) 250 MG capsule Take 250 mg by mouth 3 (three) times daily. Before dental procedures  . [DISCONTINUED] amoxicillin-clavulanate (AUGMENTIN) 875-125 MG tablet Take 1 tablet by mouth 2 (two) times daily. (Patient not taking: Reported on 10/27/2018)  . [DISCONTINUED] benzonatate (TESSALON) 200 MG capsule Take 1 capsule (200 mg total) by mouth 3 (three) times daily as needed for cough. (Patient not taking: Reported on 10/27/2018)  . [DISCONTINUED] chlorpheniramine-HYDROcodone (TUSSIONEX PENNKINETIC ER) 10-8 MG/5ML SUER Take 5 mLs by mouth at bedtime as needed for cough. (Patient not taking: Reported on 10/27/2018)  . [DISCONTINUED] hydrocortisone 2.5 % cream Apply topically 2 (two) times daily. (Patient not taking: Reported on 10/27/2018)  . [DISCONTINUED] losartan (COZAAR) 50 MG tablet Take 1 tablet (50 mg total) by mouth daily. (Patient not taking: Reported on 10/27/2018)  . [DISCONTINUED] predniSONE (DELTASONE) 10 MG tablet 6 tablets on Day 1 , then reduce by 1 tablet daily until gone (Patient not taking: Reported on 10/27/2018)  . [DISCONTINUED] triamcinolone cream (KENALOG) 0.1 % Apply 1 application topically 2 (two) times daily.  (Patient not taking: Reported on 10/27/2018)   No facility-administered encounter medications on file as of 10/27/2018.      Follow Up Instructions:    I discussed the assessment and treatment plan with the patient. The patient was provided an opportunity to ask questions and all were answered. The patient agreed with the plan and demonstrated an understanding of the instructions.   The patient was advised to call back or seek an in-person evaluation if the symptoms worsen or if the condition fails to improve as anticipated.  I provided 25 minutes of non-face-to-face time during this encounter.   Sherlene Shamseresa L Tex Conroy, MD

## 2018-10-27 NOTE — Assessment & Plan Note (Addendum)
Thyroid has not been normalized in over two years.  It was  overactive on  levothyroxine 175 mcg last May and has not been rechecked due to patient's travel   Lab Results  Component Value Date   TSH <0.01 (L) 11/29/2017

## 2018-10-27 NOTE — Assessment & Plan Note (Addendum)
Warned patient that her INR has likely increased due to low oral intake for the past week; advised to check INR asap.  Current regimen is  10 mg daily x 5,  5 mg daily x 2

## 2018-10-27 NOTE — Assessment & Plan Note (Signed)
Advised to start taking a probiotic given her recent antibiotic use .  Symptoms are not currently suggestive of c dificile colitis.  Ok to use Imodium prn

## 2018-11-21 ENCOUNTER — Ambulatory Visit: Payer: Medicare Other | Admitting: Internal Medicine

## 2018-12-08 ENCOUNTER — Other Ambulatory Visit: Payer: Self-pay

## 2018-12-08 MED ORDER — VITAMIN D (ERGOCALCIFEROL) 1.25 MG (50000 UNIT) PO CAPS: ORAL_CAPSULE | ORAL | 0 refills | Status: DC

## 2018-12-09 ENCOUNTER — Encounter: Payer: Self-pay | Admitting: Family Medicine

## 2018-12-09 ENCOUNTER — Telehealth: Payer: Self-pay | Admitting: Internal Medicine

## 2018-12-09 ENCOUNTER — Ambulatory Visit (INDEPENDENT_AMBULATORY_CARE_PROVIDER_SITE_OTHER): Payer: Medicare Other | Admitting: Family Medicine

## 2018-12-09 ENCOUNTER — Other Ambulatory Visit: Payer: Self-pay

## 2018-12-09 ENCOUNTER — Telehealth: Payer: Self-pay

## 2018-12-09 DIAGNOSIS — R6889 Other general symptoms and signs: Secondary | ICD-10-CM | POA: Diagnosis not present

## 2018-12-09 DIAGNOSIS — Z20822 Contact with and (suspected) exposure to covid-19: Secondary | ICD-10-CM

## 2018-12-09 DIAGNOSIS — R059 Cough, unspecified: Secondary | ICD-10-CM

## 2018-12-09 DIAGNOSIS — J989 Respiratory disorder, unspecified: Secondary | ICD-10-CM | POA: Diagnosis not present

## 2018-12-09 DIAGNOSIS — R0989 Other specified symptoms and signs involving the circulatory and respiratory systems: Secondary | ICD-10-CM

## 2018-12-09 DIAGNOSIS — R05 Cough: Secondary | ICD-10-CM | POA: Diagnosis not present

## 2018-12-09 MED ORDER — GUAIFENESIN ER 600 MG PO TB12: 1200.0000 mg | ORAL_TABLET | Freq: Two times a day (BID) | ORAL | 1 refills | Status: DC

## 2018-12-09 MED ORDER — ALBUTEROL SULFATE HFA 108 (90 BASE) MCG/ACT IN AERS: 2.0000 | INHALATION_SPRAY | Freq: Four times a day (QID) | RESPIRATORY_TRACT | 1 refills | Status: DC | PRN

## 2018-12-09 MED ORDER — DOXYCYCLINE HYCLATE 100 MG PO TABS: 100.0000 mg | ORAL_TABLET | Freq: Two times a day (BID) | ORAL | 0 refills | Status: AC

## 2018-12-09 NOTE — Telephone Encounter (Signed)
Copied from CRM [id number]. Topic: Quick Communication - Rx Refill/Question >> Dec 09, 2018 11:04 AM Elliot Gault wrote: Medication: doxycycline (VIBRA-TABS) 100 MG tablet  and warfarin (COUMADIN) 10 MG tablet ( drug interaction ) please advise   Preferred Pharmacy (with phone number or street name): [facility] from [place] [phone]   Agent: Please be advised that RX refills may take up to 3 business days. We ask that you follow-up with your pharmacy.

## 2018-12-09 NOTE — Telephone Encounter (Signed)
Copied from CRM [id number]. Topic: General - Inquiry >> Dec 09, 2018  8:54 AM Reggie Pile, NT wrote: Reason for CRM: Patient is calling in requesting a prescription for her cough that has a rattle to it. Patient states it was about a day or two ago. States there is no fever. Patient is requesting some advice [phone].

## 2018-12-09 NOTE — Progress Notes (Signed)
Patient ID: Maria Torres, female   DOB: 08-19-1945, 73 y.o.   MRN: [id number]    Virtual Visit via video Note  This visit type was conducted due to national recommendations for restrictions regarding the COVID-19 pandemic (e.g. social distancing).  This format is felt to be most appropriate for this patient at this time.  All issues noted in this document were discussed and addressed.  No physical exam was performed (except for noted visual exam findings with Video Visits).   I connected with [name] today at 10:40 AM EDT by a video enabled telemedicine application and verified that I am speaking with the correct person using two identifiers. Location patient: home Location provider: [facility] Persons participating in the virtual visit: patient, provider  I discussed the limitations, risks, security and privacy concerns of performing an evaluation and management service by video and the availability of in person appointments. I also discussed with the patient that there may be a patient responsible charge related to this service. The patient expressed understanding and agreed to proceed.   HPI:  Patient and I connected the video due to cough and chest congestion has been present for 2 days.  Patient became concerned because she has a history of severe pneumonia did not want to end up in the hospital again.  Patient does have an old albuterol inhaler, but has not used recently.  Denies feeling short of breath or any wheezing, but feels as if phlegm is in her chest and needs help breaking it up.  Denies fever or chills.  Denies chest pain.  Denies GI or GU issues.  Denies body aches.  Patient lives in [place].  She has been remaining home much as possible throughout the pandemic.  When she does go out, she always wears a mask and states with no other people.  She did walk to the beach recently, but when she walked the beach she wore a mask and was not near any other  people.  States her sister usually goes to the grocery store for her and will drop off the groceries in her garage.  As far she knows she has not been around anyone under investigation for coronavirus or who has been confirmed to have coronavirus.    ROS: See pertinent positives and negatives per HPI.  Past Medical History:  Diagnosis Date  . Anemia   . Aortic stenosis    Due to bicuspid aortic valve. 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 Cervicalgia   . CHF (congestive heart failure) (HCC)   . GERD (gastroesophageal reflux disease)   . Headache(784.0)   . Hx of glaucoma   . Hyperlipidemia   . Hypertension   . Mitral valve disorder   . Morbid obesity (HCC)   . Obstructive sleep apnea on CPAP   . Thyroid disease    hypothyrodism    Past Surgical History:  Procedure Laterality Date  . 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 bilateral arthroscopic knee surgery  2001 & 2002  . CARDIAC CATHETERIZATION  2004   [facility]  . CARDIAC CATHETERIZATION  2008   [facility]  . CHOLECYSTECTOMY    . gastric banding surgery     @ [place] 04-16-96  . total left hip replacement  01-20-2007  . unilateral total knee replacement  08/2002  Family History  Problem Relation Age of Onset  . Coronary artery disease Mother   . Heart disease Mother 6050  . COPD Mother   . Hyperlipidemia Mother   . Cancer Father        testicular  . Pulmonary embolism Father   . Obesity Sister        and knee problems  . Cancer Sister        breast  . Cancer Brother        throat   Social History   Tobacco Use  . Smoking status: Never Smoker  . Smokeless tobacco: Never Used  Substance Use Topics  . Alcohol use: Yes    Alcohol/week: 0.0 standard drinks    Current Outpatient Medications:  .  albuterol (PROVENTIL HFA;VENTOLIN HFA) 108 (90 Base) MCG/ACT inhaler, Inhale 3  puffs into the lungs 4 (four) times daily., Disp: 6.7 g, Rfl: 0 .  albuterol (PROVENTIL) (2.5 MG/3ML) 0.083% nebulizer solution, Take 3 mLs (2.5 mg total) by nebulization every 4 (four) hours as needed for wheezing or shortness of breath., Disp: 150 mL, Rfl: 5 .  ALPRAZolam (XANAX) 0.25 MG tablet, Take 1 tablet (0.25 mg total) by mouth daily as needed for sleep or anxiety., Disp: 30 tablet, Rfl: 3 .  amoxicillin (AMOXIL) 250 MG capsule, Take 250 mg by mouth 3 (three) times daily. Before dental procedures, Disp: , Rfl:  .  budesonide-formoterol (SYMBICORT) 160-4.5 MCG/ACT inhaler, Inhale 2 puffs into the lungs 2 (two) times daily., Disp: 1 Inhaler, Rfl: 12 .  carvedilol (COREG) 3.125 MG tablet, Take 1 tablet (3.125 mg total) by mouth every 12 (twelve) hours., Disp: 180 tablet, Rfl: 1 .  citalopram (CELEXA) 40 MG tablet, Take 1 tablet (40 mg total) by mouth daily., Disp: 90 tablet, Rfl: 1 .  HYDROcodone-acetaminophen (NORCO/VICODIN) 5-325 MG tablet, Take 1 tablet by mouth every 6 (six) hours as needed for moderate pain. As needed for severe pain, Disp: 28 tablet, Rfl: 0 .  levothyroxine (SYNTHROID, LEVOTHROID) 175 MCG tablet, Take 1 tablet (175 mcg total) by mouth daily., Disp: 90 tablet, Rfl: 1 .  lisinopril (PRINIVIL,ZESTRIL) 5 MG tablet, Take 5 mg by mouth daily. , Disp: , Rfl:  .  solifenacin (VESICARE) 10 MG tablet, Take 1 tablet (10 mg total) by mouth daily., Disp: 90 tablet, Rfl: 1 .  torsemide (DEMADEX) 20 MG tablet, Take 1 tablet (20 mg total) by mouth daily., Disp: 30 tablet, Rfl: 0 .  traMADol (ULTRAM) 50 MG tablet, TAKE 1 TABLET(50 MG) BY MOUTH EVERY 12 HOURS AS NEEDED FOR MODERATE PAIN, Disp: 30 tablet, Rfl: 2 .  Vitamin D, Ergocalciferol, (DRISDOL) 1.25 MG (50000 UT) CAPS capsule, TAKE 1 CAPSULE BY MOUTH 1 TIME A WEEK, Disp: 12 capsule, Rfl: 0 .  warfarin (COUMADIN) 10 MG tablet, Take 1 tablet (10 mg total) by mouth See admin instructions. (Patient taking differently: Take 10 mg by mouth  See admin instructions. ), Disp: 60 tablet, Rfl: 5 .  warfarin (COUMADIN) 5 MG tablet, Take 5 mg by mouth daily., Disp: , Rfl:   EXAM:  GENERAL: alert, oriented, appears well and in no acute distress  HEENT: atraumatic, conjunttiva clear, no obvious abnormalities on inspection of external nose and ears  NECK: normal movements of the head and neck  LUNGS: on inspection no signs of respiratory distress, breathing rate appears normal, no obvious gross SOB, gasping or wheezing. +moist cough present  CV: no obvious cyanosis  MS: moves all visible extremities without noticeable abnormality  PSYCH/NEURO: pleasant and cooperative, no obvious depression or anxiety, speech and thought processing grossly intact  ASSESSMENT AND PLAN:  Discussed the following assessment and plan:  Respiratory illness - Plan: doxycycline (VIBRA-TABS) 100 MG tablet, guaiFENesin (MUCINEX) 600 MG 12 hr tablet, albuterol (VENTOLIN HFA) 108 (90 Base) MCG/ACT inhaler, MYCHART COVID-19 HOME MONITORING PROGRAM, Temperature monitoring  Cough - Plan: doxycycline (VIBRA-TABS) 100 MG tablet, guaiFENesin (MUCINEX) 600 MG 12 hr tablet, albuterol (VENTOLIN HFA) 108 (90 Base) MCG/ACT inhaler, MYCHART COVID-19 HOME MONITORING PROGRAM, Temperature monitoring  Chest congestion - Plan: doxycycline (VIBRA-TABS) 100 MG tablet, guaiFENesin (MUCINEX) 600 MG 12 hr tablet, albuterol (VENTOLIN HFA) 108 (90 Base) MCG/ACT inhaler, MYCHART COVID-19 HOME MONITORING PROGRAM, Temperature monitoring  Suspected Covid-19 Virus Infection  Due to chest congestion and cough we will cover patient with doxycycline to treat a possible underlying pneumonia.  She will also begin Mucinex tablets twice daily to help break up congestion and reduce cough.  She will use albuterol inhaler 2 puffs at least 2 times a day for the next week and as needed.  After 1 week, advised to just reduce usage of albuterol inhaler as needed.  Patient agreeable to do the MyChart  home monitoring and is aware that due to her symptoms she should remain under a self quarantine for minimum of 7 days, at the end of 7 days if her symptoms have improved and she has remained fever free for at minimum of 72 hours without use of Tylenol she can come off of quarantine.  Discussed with patient that due to her medical history and symptoms she technically does meet criteria to test however patient will does not feel need to go get tested and due to living in [facility] -- she most likely would have to go to an urgent care or ER to be tested.  Overall her symptoms are mild, so patient will just remain home and self monitor.   I discussed the assessment and treatment plan with the patient. The patient was provided an opportunity to ask questions and all were answered. The patient agreed with the plan and demonstrated an understanding of the instructions.   The patient was advised to call back or seek an in-person evaluation if the symptoms worsen or if the condition fails to improve as anticipated.   Tracey Harries, FNP

## 2018-12-10 NOTE — Telephone Encounter (Signed)
Left message letting pt know that I had spoken with the pharmacist to let him know that it is okay to go ahead and fill the doxycycline. But while she is taking the antibiotic she will need to suspend her coumadin dose every third day and then have her INR rechecked at the end of the antibiotic treatment. I will call pt back in the morning to make sure she got my message.

## 2018-12-10 NOTE — Telephone Encounter (Signed)
The doxycycline will increase her INR , as all antibiotics will do.  She should suspend her coumadin dose every 3 days while on the coumadin to prevent INR from getting too high and have iNR checked at the end of the antibiotic regimen

## 2019-02-03 ENCOUNTER — Other Ambulatory Visit: Payer: Self-pay | Admitting: Internal Medicine

## 2019-02-09 ENCOUNTER — Other Ambulatory Visit: Payer: Self-pay

## 2019-02-09 MED ORDER — TRAMADOL HCL 50 MG PO TABS: ORAL_TABLET | ORAL | 1 refills | Status: DC

## 2019-02-09 NOTE — Telephone Encounter (Signed)
Tramadol sent to local pharmacy

## 2019-02-09 NOTE — Telephone Encounter (Signed)
Spoke with pt and she stated that she gets it filled at the local pharmacy at [facility].

## 2019-02-09 NOTE — Telephone Encounter (Signed)
Does the pill pack company fill her tramadol,  Or the local pharmacy

## 2019-02-09 NOTE — Telephone Encounter (Signed)
Refilled: 10/27/2018 Last OV: 10/27/2018 Next OV: not scheduled

## 2019-04-29 ENCOUNTER — Other Ambulatory Visit: Payer: Self-pay | Admitting: Internal Medicine

## 2019-04-30 NOTE — Telephone Encounter (Signed)
She is overdue for labs.  Will she get them here or in [place] and where ?

## 2019-05-15 ENCOUNTER — Other Ambulatory Visit: Payer: Self-pay | Admitting: Internal Medicine

## 2019-05-15 NOTE — Telephone Encounter (Signed)
Pt's pharmacy is calling in to request a refill for Vitamin D, Ergocalciferol, (DRISDOL) 1.25 MG (50000 UT) CAPS capsule     Pharmacy:  [facility], [place] [phone] (Phone) [phone] (Fax)

## 2019-05-18 ENCOUNTER — Ambulatory Visit (INDEPENDENT_AMBULATORY_CARE_PROVIDER_SITE_OTHER): Payer: Medicare Other | Admitting: Internal Medicine

## 2019-05-18 ENCOUNTER — Other Ambulatory Visit: Payer: Self-pay

## 2019-05-18 ENCOUNTER — Encounter: Payer: Self-pay | Admitting: Internal Medicine

## 2019-05-18 VITALS — Ht 66.0 in | Wt 289.0 lb

## 2019-05-18 DIAGNOSIS — Z7901 Long term (current) use of anticoagulants: Secondary | ICD-10-CM

## 2019-05-18 DIAGNOSIS — Z1231 Encounter for screening mammogram for malignant neoplasm of breast: Secondary | ICD-10-CM

## 2019-05-18 DIAGNOSIS — G4733 Obstructive sleep apnea (adult) (pediatric): Secondary | ICD-10-CM

## 2019-05-18 DIAGNOSIS — Z780 Asymptomatic menopausal state: Secondary | ICD-10-CM

## 2019-05-18 DIAGNOSIS — Z1159 Encounter for screening for other viral diseases: Secondary | ICD-10-CM

## 2019-05-18 DIAGNOSIS — Z9989 Dependence on other enabling machines and devices: Secondary | ICD-10-CM

## 2019-05-18 DIAGNOSIS — I10 Essential (primary) hypertension: Secondary | ICD-10-CM

## 2019-05-18 DIAGNOSIS — E7800 Pure hypercholesterolemia, unspecified: Secondary | ICD-10-CM

## 2019-05-18 DIAGNOSIS — E034 Atrophy of thyroid (acquired): Secondary | ICD-10-CM

## 2019-05-18 MED ORDER — MIRABEGRON ER 50 MG PO TB24: 50.0000 mg | ORAL_TABLET | Freq: Every day | ORAL | 3 refills | Status: DC

## 2019-05-18 NOTE — Assessment & Plan Note (Signed)
Not using it .   Discussed the long term history of OSA , the risks of long term damage to heart and the signs and symptoms attributable to OSA.  Advised patient to consider  Resuming use and use alprazolam to develop tolerance to mask.

## 2019-05-18 NOTE — Progress Notes (Signed)
Virtual Visit via [email or web address]  This visit type was conducted due to national recommendations for restrictions regarding the COVID-19 pandemic (e.g. social distancing).  This format is felt to be most appropriate for this patient at this time.  All issues noted in this document were discussed and addressed.  No physical exam was performed (except for noted visual exam findings with Video Visits).   I connected with@ on 05/18/19 at  9:30 AM EDT by a video enabled telemedicine application  and verified that I am speaking with the correct person using two identifiers. Location patient: home in [place] Location provider:  home office Persons participating in the virtual visit: patient, provider  I discussed the limitations, risks, security and privacy concerns of performing an evaluation and management service by telephone and the availability of in person appointments. I also discussed with the patient that there may be a patient responsible charge related to this service. The patient expressed understanding and agreed to proceed.  Reason for visit: follow up   HPI:  73 yr old female with hypertension,  Morbid obesity and hypothyroidism,  aortic valve replacement on chronc warfarin, presents for 6 month follow p.    The patient has no signs or symptoms of COVID 19 infection (fever, cough, sore throat  or shortness of breath beyond what is typical for patient).  Patient denies contact with other persons with the above mentioned symptoms or with anyone confirmed to have COVID 19 .  She has been visiting a friend in [place] for the weekend , and is heading back to [place] today .  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  going to cut back to two days per week   Generic vesicare not working , urinary incontinence getting worse.  Wearing 2 pads at night  Trial of myrbetriq discussed   Health maintenance long overdue . Last mammogram 2014,     usng tramadol  for bilateral knee pain and DDD cervical spine ,  Takes only  at night  Due for Prevnar,  Mammogram and DEXA  As well as labs  Hypothyroid:  Last labs may 2019  Thyroid overactive.  Screened for symptoms of overactive thyroid.  Notes some hair loss,  intentional tremor of right hand only ,  Several epsidoes of fecal incontinence,  but weight stable and no increase in defecation frequency  .  Patient is taking her medications as prescribed and notes no adverse effects.  Home BP readings have been done about once per week and are  generally < 130/80 .  She is avoiding added salt in her diet and walking regularly about 3 times per week for exercise  .  diagnosed with prior sleep study but treatment has been deferred d by patient.  Discussed the long term history of OSA , the risks of long term damage to heart and the signs and symptoms attributable to OSA.  Advised patient to consider  significant weight loss and/or use of CPAP.   Has gained weight,  Not exercising since pandemic , "just lazy"  ROS: See pertinent positives and negatives per HPI.  Past Medical History:  Diagnosis Date  . Anemia   . Aortic stenosis    Due to bicuspid aortic valve. 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 at [facility]  . 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 Cervicalgia   . CHF (congestive heart failure) (  HCC)   . GERD (gastroesophageal reflux disease)   . Headache(784.0)   . Hx of glaucoma   . Hyperlipidemia   . Hypertension   . Mitral valve disorder   . Morbid obesity (HCC)   . Obstructive sleep apnea on CPAP   . Thyroid disease    hypothyrodism    Past Surgical History:  Procedure Laterality Date  . 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 bilateral arthroscopic knee surgery  2001 & 2002  . CARDIAC CATHETERIZATION  2004   [facility]  . CARDIAC CATHETERIZATION  2008   [facility]  . CHOLECYSTECTOMY    . gastric banding surgery      @ [place] 04-16-96  . total left hip replacement  01-20-2007  . unilateral total knee replacement  08/2002    Family History  Problem Relation Age of Onset  . Coronary artery disease Mother   . Heart disease Mother 61  . COPD Mother   . Hyperlipidemia Mother   . Cancer Father        testicular  . Pulmonary embolism Father   . Obesity Sister        and knee problems  . Cancer Sister        breast  . Cancer Brother        throat    SOCIAL HX:  reports that she has never smoked. She has never used smokeless tobacco. She reports current alcohol use. She reports that she does not use drugs.    Current Outpatient Medications:  .  albuterol (PROVENTIL HFA;VENTOLIN HFA) 108 (90 Base) MCG/ACT inhaler, Inhale 3 puffs into the lungs 4 (four) times daily., Disp: 6.7 g, Rfl: 0 .  albuterol (PROVENTIL) (2.5 MG/3ML) 0.083% nebulizer solution, Take 3 mLs (2.5 mg total) by nebulization every 4 (four) hours as needed for wheezing or shortness of breath., Disp: 150 mL, Rfl: 5 .  albuterol (VENTOLIN HFA) 108 (90 Base) MCG/ACT inhaler, Inhale 2 puffs into the lungs every 6 (six) hours as needed for wheezing or shortness of breath., Disp: 1 Inhaler, Rfl: 1 .  ALPRAZolam (XANAX) 0.25 MG tablet, Take 1 tablet (0.25 mg total) by mouth daily as needed for sleep or anxiety., Disp: 30 tablet, Rfl: 3 .  carvedilol (COREG) 3.125 MG tablet, Take 1 tablet (3.125 mg total) by mouth every 12 (twelve) hours., Disp: 180 tablet, Rfl: 1 .  citalopram (CELEXA) 40 MG tablet, Take 1 tablet (40 mg total) by mouth daily., Disp: 90 tablet, Rfl: 0 .  levothyroxine (SYNTHROID, LEVOTHROID) 175 MCG tablet, Take 1 tablet (175 mcg total) by mouth daily., Disp: 90 tablet, Rfl: 1 .  lisinopril (PRINIVIL,ZESTRIL) 5 MG tablet, Take 5 mg by mouth daily. , Disp: , Rfl:  .  solifenacin (VESICARE) 10 MG tablet, Take 1 tablet (10 mg total) by mouth daily., Disp: 90 tablet, Rfl: 0 .  torsemide (DEMADEX) 20 MG tablet, Take 1 tablet  (20 mg total) by mouth daily., Disp: 30 tablet, Rfl: 0 .  traMADol (ULTRAM) 50 MG tablet, TAKE 1 TABLET(50 MG) BY MOUTH EVERY 12 HOURS AS NEEDED FOR MODERATE PAIN, Disp: 60 tablet, Rfl: 1 .  Vitamin D, Ergocalciferol, (DRISDOL) 1.25 MG (50000 UT) CAPS capsule, TAKE 1 CAPSULE BY MOUTH 1 TIME A WEEK, Disp: 12 capsule, Rfl: 0 .  warfarin (COUMADIN) 10 MG tablet, Take 1 tablet (10 mg total) by mouth See admin instructions. (Patient taking differently: Take 10 mg by mouth See admin instructions. ), Disp:  60 tablet, Rfl: 5 .  warfarin (COUMADIN) 5 MG tablet, Take 5 mg by mouth daily., Disp: , Rfl:  .  amoxicillin (AMOXIL) 250 MG capsule, Take 250 mg by mouth 3 (three) times daily. Before dental procedures, Disp: , Rfl:  .  mirabegron ER (MYRBETRIQ) 50 MG TB24 tablet, Take 1 tablet (50 mg total) by mouth daily., Disp: 30 tablet, Rfl: 3  EXAM:  VITALS per patient if applicable:  GENERAL: alert, oriented, appears well and in no acute distress  HEENT: atraumatic, conjunttiva clear, no obvious abnormalities on inspection of external nose and ears  NECK: normal movements of the head and neck  LUNGS: on inspection no signs of respiratory distress, breathing rate appears normal, no obvious gross SOB, gasping or wheezing  CV: no obvious cyanosis  MS: moves all visible extremities without noticeable abnormality  PSYCH/NEURO: pleasant and cooperative, no obvious depression or anxiety, speech and thought processing grossly intact  ASSESSMENT AND PLAN:  Discussed the following assessment and plan:  Encounter for screening mammogram for breast cancer - Plan: MM DIGITAL SCREENING BILATERAL  Postmenopausal estrogen deficiency - Plan: DG Bone Density  Obesity, Class III, BMI 40-49.9 (morbid obesity) ([place]) - Plan: Hemoglobin A1c  Long term current use of anticoagulant therapy - Plan: CBC with Differential/Platelet  Hypothyroidism due to acquired atrophy of thyroid - Plan: TSH  Pure  hypercholesterolemia - Plan: Lipid panel  Benign hypertension - Plan: Comprehensive metabolic panel, Microalbumin / creatinine urine ratio  Encounter for hepatitis C screening test for low risk patient - Plan: Hepatitis C antibody  OSA on CPAP  OSA on CPAP Not using it .   Discussed the long term history of OSA , the risks of long term damage to heart and the signs and symptoms attributable to OSA.  Advised patient to consider  Resuming use and use alprazolam to develop tolerance to mask.   Hypothyroidism TSH was suppressed over one year ago with patient being lost to follow up.  She ha no symptoms of overt hypothyroidism and has been advised to continue current medication unitl TSH uean be checked.   Long term current use of anticoagulant therapy Warfarin therapy is managed by cardiology  Benign hypertension Decline in GFR was noted at last visit and request for repeat labs ignored until today  Lab Results  Component Value Date   CREATININE 1.32 (H) 11/29/2017   Lab Results  Component Value Date   NA 141 11/29/2017   K 4.2 11/29/2017   CL 101 11/29/2017   CO2 32 11/29/2017       I discussed the assessment and treatment plan with the patient. The patient was provided an opportunity to ask questions and all were answered. The patient agreed with the plan and demonstrated an understanding of the instructions.   The patient was advised to call back or seek an in-person evaluation if the symptoms worsen or if the condition fails to improve as anticipated.  I provided  40 minutes of non-face-to-face time during this encounter reviewing patient's current problems and post surgeries.  Providing counseling on the above mentioned problems , and coordination  of care .   Crecencio Mc, MD

## 2019-05-18 NOTE — Assessment & Plan Note (Signed)
Decline in GFR was noted at last visit and request for repeat labs ignored until today  Lab Results  Component Value Date   CREATININE 1.32 (H) 11/29/2017   Lab Results  Component Value Date   NA 141 11/29/2017   K 4.2 11/29/2017   CL 101 11/29/2017   CO2 32 11/29/2017

## 2019-05-18 NOTE — Patient Instructions (Signed)
You need the prevnar vaccine asap!  I am ordering your mammogram and DEXA scan

## 2019-05-18 NOTE — Assessment & Plan Note (Signed)
Warfarin therapy is managed by cardiology

## 2019-05-18 NOTE — Telephone Encounter (Signed)
Refilled: 02/04/2019 Last OV: 10/27/2018 Next OV: today  Last lab: 11/29/2017

## 2019-05-18 NOTE — Assessment & Plan Note (Signed)
TSH was suppressed over one year ago with patient being lost to follow up.  She ha no symptoms of overt hypothyroidism and has been advised to continue current medication unitl TSH uean be checked.

## 2019-05-19 MED ORDER — VITAMIN D (ERGOCALCIFEROL) 1.25 MG (50000 UNIT) PO CAPS: 50000.0000 [IU] | ORAL_CAPSULE | ORAL | 0 refills | Status: DC

## 2019-05-19 NOTE — Telephone Encounter (Signed)
Labs mailed

## 2019-05-19 NOTE — Telephone Encounter (Signed)
Please remind patient she is due for labs and needs to mailed the lab order sheets for a [facility] [place]

## 2019-05-26 ENCOUNTER — Other Ambulatory Visit: Payer: Self-pay

## 2019-05-26 MED ORDER — SOLIFENACIN SUCCINATE 10 MG PO TABS: 10.0000 mg | ORAL_TABLET | Freq: Every day | ORAL | 1 refills | Status: DC

## 2019-06-08 ENCOUNTER — Telehealth: Payer: Self-pay | Admitting: Internal Medicine

## 2019-06-08 DIAGNOSIS — E876 Hypokalemia: Secondary | ICD-10-CM

## 2019-06-10 NOTE — Telephone Encounter (Signed)
Melissa,  Can you look into her dexa and mammogram orders? Thanks

## 2019-06-11 NOTE — Telephone Encounter (Signed)
Melissa,  Did Rasheedah forward to you the info needed to get her orders to [facility]?

## 2019-06-15 ENCOUNTER — Other Ambulatory Visit: Payer: Self-pay

## 2019-06-15 DIAGNOSIS — R059 Cough, unspecified: Secondary | ICD-10-CM

## 2019-06-15 DIAGNOSIS — R05 Cough: Secondary | ICD-10-CM

## 2019-06-15 DIAGNOSIS — J989 Respiratory disorder, unspecified: Secondary | ICD-10-CM

## 2019-06-15 DIAGNOSIS — R0989 Other specified symptoms and signs involving the circulatory and respiratory systems: Secondary | ICD-10-CM

## 2019-06-15 MED ORDER — ALBUTEROL SULFATE HFA 108 (90 BASE) MCG/ACT IN AERS: 2.0000 | INHALATION_SPRAY | Freq: Four times a day (QID) | RESPIRATORY_TRACT | 2 refills | Status: DC | PRN

## 2019-07-03 ENCOUNTER — Other Ambulatory Visit: Payer: Self-pay | Admitting: Internal Medicine

## 2019-07-10 ENCOUNTER — Other Ambulatory Visit: Payer: Self-pay

## 2019-07-10 MED ORDER — LEVOTHYROXINE SODIUM 175 MCG PO TABS: 175.0000 ug | ORAL_TABLET | Freq: Every day | ORAL | 1 refills | Status: DC

## 2019-07-10 MED ORDER — MIRABEGRON ER 50 MG PO TB24: 50.0000 mg | ORAL_TABLET | Freq: Every day | ORAL | 3 refills | Status: DC

## 2019-07-10 NOTE — Telephone Encounter (Signed)
Refilled: 02/09/2019 Last OV: 05/18/2019 Next OV: not scheduled

## 2019-07-13 ENCOUNTER — Other Ambulatory Visit: Payer: Self-pay

## 2019-07-13 MED ORDER — VITAMIN D (ERGOCALCIFEROL) 1.25 MG (50000 UNIT) PO CAPS: 50000.0000 [IU] | ORAL_CAPSULE | ORAL | 0 refills | Status: DC

## 2019-07-13 MED ORDER — CITALOPRAM HYDROBROMIDE 40 MG PO TABS: 40.0000 mg | ORAL_TABLET | Freq: Every day | ORAL | 1 refills | Status: DC

## 2019-07-13 MED ORDER — TRAMADOL HCL 50 MG PO TABS: ORAL_TABLET | ORAL | 1 refills | Status: DC

## 2019-07-14 ENCOUNTER — Other Ambulatory Visit: Payer: Self-pay

## 2019-07-14 MED ORDER — MIRABEGRON ER 50 MG PO TB24: 50.0000 mg | ORAL_TABLET | Freq: Every day | ORAL | 3 refills | Status: DC

## 2019-07-14 MED ORDER — LEVOTHYROXINE SODIUM 175 MCG PO TABS: 175.0000 ug | ORAL_TABLET | Freq: Every day | ORAL | 1 refills | Status: DC

## 2019-07-30 ENCOUNTER — Other Ambulatory Visit: Payer: Self-pay

## 2019-07-30 ENCOUNTER — Ambulatory Visit (INDEPENDENT_AMBULATORY_CARE_PROVIDER_SITE_OTHER): Payer: Medicare HMO | Admitting: Internal Medicine

## 2019-07-30 ENCOUNTER — Encounter: Payer: Self-pay | Admitting: Internal Medicine

## 2019-07-30 DIAGNOSIS — Z9989 Dependence on other enabling machines and devices: Secondary | ICD-10-CM | POA: Diagnosis not present

## 2019-07-30 DIAGNOSIS — G4733 Obstructive sleep apnea (adult) (pediatric): Secondary | ICD-10-CM

## 2019-07-30 DIAGNOSIS — I10 Essential (primary) hypertension: Secondary | ICD-10-CM | POA: Diagnosis not present

## 2019-07-30 NOTE — Progress Notes (Signed)
Virtual Visit vis [email or web address]  This visit type was conducted due to national recommendations for restrictions regarding the COVID-19 pandemic (e.g. social distancing).  This format is felt to be most appropriate for this patient at this time.  All issues noted in this document were discussed and addressed.  No physical exam was performed (except for noted visual exam findings with Video Visits).   I connected with@ on 07/30/19 at  9:00 AM EST by a video enabled telemedicine application and verified that I am speaking with the correct person using two identifiers. Location patient: home Location provider: work or home office Persons participating in the virtual visit: patient, provider  I discussed the limitations, risks, security and privacy concerns of performing an evaluation and management service by telephone and the availability of in person appointments. I also discussed with the patient that there may be a patient responsible charge related to this service. The patient expressed understanding and agreed to proceed.  Reason for visit: fatigue, urinary incontinence , sleep problems   HPI:  74 yr old female with a history of hypertension, depression , OSA and morbid obesity presents for evaluation of persistent fatigue.    "I'm in a funk" for the past 2 weeks    Urinary urge frequency despite use of myrbetriq , 2-3 times per night  using 5 pads  Per  DAY .  Has stopped using torsemide because was unable to leave the bathroom.  Has stopped drinking fluids after 6 pm, which has helped.   Has gained weight from eating poorly during the holidays.  Has joined [employer]  and so far really likes the program .  Having trouble controlling her craving for sweets.    Patient having disrupted sleep,  Feels exhausted in am,  Sleepy during day, tired all the time. She was diagnosed with OSA over ten years ago but noncompliant and has not used it in months  Due to discomfort and intolerance of anything on  her face ,,  She is now very symptomatic without it .  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   Hypertension: patient checks blood pressure twice weekly at home.  Readings have been for the most part < 140/80 at rest . Patient is following a reduce salt diet most days and is taking medications as prescribed     Mammogram ordered and scheduled jan  20 th   In September but not done ! [facility] October  Getting the COVID VACCINE    ROS: See pertinent positives and negatives per HPI.  Past Medical History:  Diagnosis Date  . Anemia   . Aortic stenosis    Due to bicuspid aortic valve. 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 at [place]  . 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 Cervicalgia   . CHF (congestive heart failure) (HCC)   . GERD (gastroesophageal reflux disease)   . Headache(784.0)   . Hx of glaucoma   . Hyperlipidemia   . Hypertension   . Mitral valve disorder   . Morbid obesity (HCC)   . Obstructive sleep apnea on CPAP   . Thyroid disease    hypothyrodism    Past Surgical History:  Procedure Laterality Date  . 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 bilateral arthroscopic knee surgery  2001 &  2002  . CARDIAC CATHETERIZATION  2004   [facility]  . CARDIAC CATHETERIZATION  2008   [facility]  . CHOLECYSTECTOMY    . gastric banding surgery     @ [place] 04-16-96  . total left hip replacement  01-20-2007  . unilateral total knee replacement  08/2002    Family History  Problem Relation Age of Onset  . Coronary artery disease Mother   . Heart disease Mother 73  . COPD Mother   . Hyperlipidemia Mother   . Cancer Father        testicular  . Pulmonary embolism Father   . Obesity Sister        and knee problems  . Cancer Sister        breast  . Cancer Brother        throat    SOCIAL HX:  reports that she  has never smoked. She has never used smokeless tobacco. She reports current alcohol use. She reports that she does not use drugs.   Current Outpatient Medications:  .  albuterol (PROVENTIL HFA;VENTOLIN HFA) 108 (90 Base) MCG/ACT inhaler, Inhale 3 puffs into the lungs 4 (four) times daily., Disp: 6.7 g, Rfl: 0 .  albuterol (PROVENTIL) (2.5 MG/3ML) 0.083% nebulizer solution, Take 3 mLs (2.5 mg total) by nebulization every 4 (four) hours as needed for wheezing or shortness of breath., Disp: 150 mL, Rfl: 5 .  albuterol (VENTOLIN HFA) 108 (90 Base) MCG/ACT inhaler, Inhale 2 puffs into the lungs every 6 (six) hours as needed for wheezing or shortness of breath., Disp: 18 g, Rfl: 2 .  ALPRAZolam (XANAX) 0.25 MG tablet, Take 1 tablet (0.25 mg total) by mouth daily as needed for sleep or anxiety., Disp: 30 tablet, Rfl: 3 .  amoxicillin (AMOXIL) 250 MG capsule, Take 250 mg by mouth 3 (three) times daily. Before dental procedures, Disp: , Rfl:  .  carvedilol (COREG) 3.125 MG tablet, Take 1 tablet by mouth every 12 hours., Disp: 180 tablet, Rfl: 0 .  citalopram (CELEXA) 40 MG tablet, Take 1 tablet (40 mg total) by mouth daily., Disp: 90 tablet, Rfl: 1 .  levothyroxine (SYNTHROID) 175 MCG tablet, Take 1 tablet (175 mcg total) by mouth daily., Disp: 90 tablet, Rfl: 1 .  lisinopril (PRINIVIL,ZESTRIL) 5 MG tablet, Take 5 mg by mouth daily. , Disp: , Rfl:  .  mirabegron ER (MYRBETRIQ) 50 MG TB24 tablet, Take 1 tablet (50 mg total) by mouth daily., Disp: 90 tablet, Rfl: 3 .  solifenacin (VESICARE) 10 MG tablet, Take 1 tablet (10 mg total) by mouth daily., Disp: 90 tablet, Rfl: 1 .  torsemide (DEMADEX) 20 MG tablet, Take 20 mg by mouth daily., Disp: , Rfl:  .  traMADol (ULTRAM) 50 MG tablet, TAKE 1 TABLET(50 MG) BY MOUTH EVERY 12 HOURS AS NEEDED FOR MODERATE PAIN, Disp: 60 tablet, Rfl: 1 .  Vitamin D, Ergocalciferol, (DRISDOL) 1.25 MG (50000 UT) CAPS capsule, Take 1 capsule (50,000 Units total) by mouth every 7  (seven) days., Disp: 12 capsule, Rfl: 0 .  warfarin (COUMADIN) 10 MG tablet, Take 1 tablet (10 mg total) by mouth See admin instructions. (Patient taking differently: Take 10 mg by mouth See admin instructions. ), Disp: 60 tablet, Rfl: 5 .  warfarin (COUMADIN) 5 MG tablet, Take 5 mg by mouth daily., Disp: , Rfl:   EXAM:  VITALS per patient if applicable:  GENERAL: alert, oriented, appears well and in no acute distress  HEENT: atraumatic, conjunttiva clear, no obvious abnormalities  on inspection of external nose and ears  NECK: normal movements of the head and neck  LUNGS: on inspection no signs of respiratory distress, breathing rate appears normal, no obvious gross SOB, gasping or wheezing  CV: no obvious cyanosis  MS: moves all visible extremities without noticeable abnormality  PSYCH/NEURO: pleasant and cooperative, no obvious depression or anxiety, speech and thought processing grossly intact  ASSESSMENT AND PLAN:  Discussed the following assessment and plan:  OSA on CPAP  Benign hypertension  Obesity, Class III, BMI 40-49.9 (morbid obesity) (HCC)  OSA on CPAP Se has been intolerant of the face mask and Not using it . She has been excessively fatigued and hypersomnolent  Since she stopped taking it Discussed the long term history of OSA , the risks of long term damage to heart and the signs and symptoms attributable to OSA.  Advised patient to consider  Resuming use and use alprazolam to develop tolerance to mask.   Benign hypertension Well controlled on current regimen. Renal function assessment is overdue   Obesity, Class III, BMI 40-49.9 (morbid obesity) .I have addressed  BMI and recommended a low glycemic index diet utilizing smaller more frequent meals to increase metabolism.  I have also recommended that patient start exercising with a goal of 30 minutes of aerobic exercise a minimum of 5 days per week.  She has joined the [employer] loss program [employer].     I  discussed the assessment and treatment plan with the patient. The patient was provided an opportunity to ask questions and all were answered. The patient agreed with the plan and demonstrated an understanding of the instructions.   The patient was advised to call back or seek an in-person evaluation if the symptoms worsen or if the condition fails to improve as anticipated.   Crecencio Mc, MD

## 2019-08-01 NOTE — Assessment & Plan Note (Addendum)
Well controlled on current regimen. Renal function assessment is overdue

## 2019-08-01 NOTE — Assessment & Plan Note (Signed)
Se has been intolerant of the face mask and Not using it . She has been excessively fatigued and hypersomnolent  Since she stopped taking it Discussed the long term history of OSA , the risks of long term damage to heart and the signs and symptoms attributable to OSA.  Advised patient to consider  Resuming use and use alprazolam to develop tolerance to mask.

## 2019-08-01 NOTE — Assessment & Plan Note (Signed)
.  I have addressed  BMI and recommended a low glycemic index diet utilizing smaller more frequent meals to increase metabolism.  I have also recommended that patient start exercising with a goal of 30 minutes of aerobic exercise a minimum of 5 days per week.  She has joined the [employer] loss program [employer].

## 2019-09-01 ENCOUNTER — Ambulatory Visit: Payer: Medicare HMO | Admitting: Internal Medicine

## 2019-09-07 DIAGNOSIS — G4730 Sleep apnea, unspecified: Secondary | ICD-10-CM | POA: Diagnosis not present

## 2019-09-10 ENCOUNTER — Ambulatory Visit: Payer: Medicare HMO | Admitting: Internal Medicine

## 2019-09-10 DIAGNOSIS — Z1159 Encounter for screening for other viral diseases: Secondary | ICD-10-CM | POA: Diagnosis not present

## 2019-09-10 DIAGNOSIS — I10 Essential (primary) hypertension: Secondary | ICD-10-CM | POA: Diagnosis not present

## 2019-09-10 DIAGNOSIS — E7800 Pure hypercholesterolemia, unspecified: Secondary | ICD-10-CM | POA: Diagnosis not present

## 2019-09-10 DIAGNOSIS — Z7901 Long term (current) use of anticoagulants: Secondary | ICD-10-CM | POA: Diagnosis not present

## 2019-09-10 DIAGNOSIS — E034 Atrophy of thyroid (acquired): Secondary | ICD-10-CM | POA: Diagnosis not present

## 2019-09-11 ENCOUNTER — Other Ambulatory Visit: Payer: Self-pay | Admitting: Internal Medicine

## 2019-09-11 DIAGNOSIS — E034 Atrophy of thyroid (acquired): Secondary | ICD-10-CM

## 2019-09-11 LAB — TSH: TSH: 0.087 u[IU]/mL — ABNORMAL LOW (ref 0.450–4.500)

## 2019-09-11 LAB — LIPID PANEL
Chol/HDL Ratio: 2.7 ratio (ref 0.0–4.4)
Cholesterol, Total: 196 mg/dL (ref 100–199)
HDL: 73 mg/dL (ref >39)
LDL Chol Calc (NIH): 105 mg/dL — ABNORMAL HIGH (ref 0–99)
Triglycerides: 99 mg/dL (ref 0–149)
VLDL Cholesterol Cal: 18 mg/dL (ref 5–40)

## 2019-09-11 LAB — CBC WITH DIFFERENTIAL/PLATELET
Basophils Absolute: 0.1 10*3/uL (ref 0.0–0.2)
Basos: 1 %
EOS (ABSOLUTE): 0.6 10*3/uL — ABNORMAL HIGH (ref 0.0–0.4)
Eos: 8 %
Hematocrit: 40.3 % (ref 34.0–46.6)
Hemoglobin: 12.8 g/dL (ref 11.1–15.9)
Immature Grans (Abs): 0 10*3/uL (ref 0.0–0.1)
Immature Granulocytes: 0 %
Lymphocytes Absolute: 2.2 10*3/uL (ref 0.7–3.1)
Lymphs: 30 %
MCH: 29.4 pg (ref 26.6–33.0)
MCHC: 31.8 g/dL (ref 31.5–35.7)
MCV: 93 fL (ref 79–97)
Monocytes Absolute: 0.5 10*3/uL (ref 0.1–0.9)
Monocytes: 7 %
Neutrophils Absolute: 3.9 10*3/uL (ref 1.4–7.0)
Neutrophils: 54 %
Platelets: 235 10*3/uL (ref 150–450)
RBC: 4.35 x10E6/uL (ref 3.77–5.28)
RDW: 12.6 % (ref 11.7–15.4)
WBC: 7.3 10*3/uL (ref 3.4–10.8)

## 2019-09-11 LAB — COMPREHENSIVE METABOLIC PANEL
ALT: 13 IU/L (ref 0–32)
AST: 14 IU/L (ref 0–40)
Albumin/Globulin Ratio: 1.4 (ref 1.2–2.2)
Albumin: 4.4 g/dL (ref 3.7–4.7)
Alkaline Phosphatase: 72 IU/L (ref 39–117)
BUN/Creatinine Ratio: 27 (ref 12–28)
BUN: 34 mg/dL — ABNORMAL HIGH (ref 8–27)
Bilirubin Total: 0.3 mg/dL (ref 0.0–1.2)
CO2: 21 mmol/L (ref 20–29)
Calcium: 8.8 mg/dL (ref 8.7–10.3)
Chloride: 103 mmol/L (ref 96–106)
Creatinine, Ser: 1.24 mg/dL — ABNORMAL HIGH (ref 0.57–1.00)
GFR calc Af Amer: 50 mL/min/{1.73_m2} — ABNORMAL LOW (ref >59)
GFR calc non Af Amer: 43 mL/min/{1.73_m2} — ABNORMAL LOW (ref >59)
Globulin, Total: 3.1 g/dL (ref 1.5–4.5)
Glucose: 88 mg/dL (ref 65–99)
Potassium: 4.8 mmol/L (ref 3.5–5.2)
Sodium: 140 mmol/L (ref 134–144)
Total Protein: 7.5 g/dL (ref 6.0–8.5)

## 2019-09-11 LAB — HEPATITIS C ANTIBODY: Hep C Virus Ab: <0.1 s/co ratio (ref 0.0–0.9)

## 2019-09-11 LAB — MICROALBUMIN / CREATININE URINE RATIO
Creatinine, Urine: 106.6 mg/dL
Microalb/Creat Ratio: <3 mg/g creat (ref 0–29)
Microalbumin, Urine: <3 ug/mL

## 2019-09-11 LAB — HEMOGLOBIN A1C
Est. average glucose Bld gHb Est-mCnc: 108 mg/dL
Hgb A1c MFr Bld: 5.4 % (ref 4.8–5.6)

## 2019-09-11 MED ORDER — LEVOTHYROXINE SODIUM 150 MCG PO TABS: 150.0000 ug | ORAL_TABLET | Freq: Every day | ORAL | 3 refills | Status: DC

## 2019-09-11 NOTE — Assessment & Plan Note (Signed)
Thyroid function is overactive on current 175 mcg dose.  I have sent   a lower dose of levothyroxine to patient's  pharmacy and would like patient to change immediately. We will need to recheck her thyroid function after a minimum of 6 weeks post medication change,

## 2019-09-12 DIAGNOSIS — Z1231 Encounter for screening mammogram for malignant neoplasm of breast: Secondary | ICD-10-CM | POA: Diagnosis not present

## 2019-09-12 LAB — HM MAMMOGRAPHY

## 2019-09-14 DIAGNOSIS — N958 Other specified menopausal and perimenopausal disorders: Secondary | ICD-10-CM | POA: Diagnosis not present

## 2019-09-14 DIAGNOSIS — M810 Age-related osteoporosis without current pathological fracture: Secondary | ICD-10-CM | POA: Diagnosis not present

## 2019-09-14 DIAGNOSIS — M85851 Other specified disorders of bone density and structure, right thigh: Secondary | ICD-10-CM | POA: Diagnosis not present

## 2019-09-14 LAB — HM DEXA SCAN

## 2019-09-15 ENCOUNTER — Telehealth: Payer: Medicare HMO | Admitting: Internal Medicine

## 2019-09-17 ENCOUNTER — Telehealth: Payer: Self-pay | Admitting: Internal Medicine

## 2019-09-17 ENCOUNTER — Other Ambulatory Visit: Payer: Self-pay | Admitting: Internal Medicine

## 2019-09-17 NOTE — Telephone Encounter (Signed)
Pt called and wanted something called in for her anxiety she has an appt on 09/28/19

## 2019-09-17 NOTE — Telephone Encounter (Signed)
Refill request for Vitamin D high dose, last seen 07-30-2019, last filled 06/2019.  Please advise. Last vitamin d check was in 11/2017.

## 2019-09-18 NOTE — Telephone Encounter (Signed)
Spoke with pt to let her know that she will need to schedule an appt with Dr. Darrick Huntsman to discuss the need for medication for anxiety. The pt stated that she does not want to schedule an appt any sooner than her appt on 09/28/2019. Pt stated that she is fine she just had a lot of stress yesterday.

## 2019-09-18 NOTE — Telephone Encounter (Signed)
YES

## 2019-09-18 NOTE — Telephone Encounter (Signed)
Does pt need to schedule an appt to discuss the need for anxiety medication?

## 2019-09-21 DIAGNOSIS — R0602 Shortness of breath: Secondary | ICD-10-CM | POA: Diagnosis not present

## 2019-09-21 DIAGNOSIS — Z7901 Long term (current) use of anticoagulants: Secondary | ICD-10-CM | POA: Diagnosis not present

## 2019-09-21 DIAGNOSIS — Z952 Presence of prosthetic heart valve: Secondary | ICD-10-CM | POA: Diagnosis not present

## 2019-09-21 DIAGNOSIS — I712 Thoracic aortic aneurysm, without rupture: Secondary | ICD-10-CM | POA: Diagnosis not present

## 2019-09-21 DIAGNOSIS — I350 Nonrheumatic aortic (valve) stenosis: Secondary | ICD-10-CM | POA: Diagnosis not present

## 2019-09-21 DIAGNOSIS — Z8774 Personal history of (corrected) congenital malformations of heart and circulatory system: Secondary | ICD-10-CM | POA: Diagnosis not present

## 2019-09-22 ENCOUNTER — Telehealth: Payer: Self-pay | Admitting: Internal Medicine

## 2019-09-22 DIAGNOSIS — M810 Age-related osteoporosis without current pathological fracture: Secondary | ICD-10-CM | POA: Insufficient documentation

## 2019-09-22 NOTE — Telephone Encounter (Signed)
MyChart message sent  Re dexa scan results.  OV needed to discuss therapy

## 2019-09-22 NOTE — Telephone Encounter (Signed)
Spoke with pt and she stated that she received the mycahrt message and has read it. Pt has an appt scheduled on 09/28/2019.

## 2019-09-28 ENCOUNTER — Encounter: Payer: Self-pay | Admitting: Internal Medicine

## 2019-09-28 ENCOUNTER — Telehealth (INDEPENDENT_AMBULATORY_CARE_PROVIDER_SITE_OTHER): Payer: Medicare HMO | Admitting: Internal Medicine

## 2019-09-28 ENCOUNTER — Other Ambulatory Visit: Payer: Self-pay

## 2019-09-28 VITALS — BP 117/56 | Ht 66.0 in | Wt 315.0 lb

## 2019-09-28 DIAGNOSIS — Z9989 Dependence on other enabling machines and devices: Secondary | ICD-10-CM

## 2019-09-28 DIAGNOSIS — I10 Essential (primary) hypertension: Secondary | ICD-10-CM

## 2019-09-28 DIAGNOSIS — G4733 Obstructive sleep apnea (adult) (pediatric): Secondary | ICD-10-CM | POA: Diagnosis not present

## 2019-09-28 DIAGNOSIS — M810 Age-related osteoporosis without current pathological fracture: Secondary | ICD-10-CM | POA: Diagnosis not present

## 2019-09-28 DIAGNOSIS — R5381 Other malaise: Secondary | ICD-10-CM

## 2019-09-28 MED ORDER — ALPRAZOLAM 0.25 MG PO TABS: 0.2500 mg | ORAL_TABLET | Freq: Every day | ORAL | 3 refills | Status: DC | PRN

## 2019-09-28 NOTE — Assessment & Plan Note (Addendum)
Discussed options.  Has too much dental work IN HER FUTURE  Tostart bisphosphonate therapy.  Use of Evista c/i due to use of anticoagulant for valve disorder.  Discussed Prolia as option and she is agreeable. Marland Kitchen
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
## 2019-09-29 ENCOUNTER — Telehealth: Payer: Self-pay | Admitting: Internal Medicine

## 2019-09-29 DIAGNOSIS — R5381 Other malaise: Secondary | ICD-10-CM | POA: Insufficient documentation

## 2019-09-29 NOTE — Assessment & Plan Note (Signed)
EF normal by Feb 2021 ECHO.  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 Advice given

## 2019-09-29 NOTE — Assessment & Plan Note (Signed)
Home sleep study confirming OSa.  Referral for titration study and new mask

## 2019-09-29 NOTE — Telephone Encounter (Signed)
Sleep study report received from [facility].  ,  SO confirmed.   I have ordered a CPAP titration study to be done , hopefully in [place] so an alternative mask can be tried

## 2019-09-30 ENCOUNTER — Telehealth: Payer: Self-pay | Admitting: *Deleted

## 2019-09-30 NOTE — Telephone Encounter (Signed)
Insurance verification for Prolia filed on [employer] Portal. 

## 2019-10-01 NOTE — Telephone Encounter (Signed)
Spoke with pt and she stated that she has an appt with a doctor that is going to fit her for a mouth piece to wear at night. Pt stated that she wants to do what is best because she knows she needs to take care of the sleep apnea. Pt is wanting to know if you thought she should try the mouth piece first and then if that does not work then have the titration study done.

## 2019-10-01 NOTE — Telephone Encounter (Signed)
,   she can try the mouthpiece, but she will still need to have the sleep study using the mouthpiece to see if it corrects the apnea

## 2019-10-08 NOTE — Telephone Encounter (Signed)
Spoke with pt and she stated that she would love to do the sleep study once she gets the mouthpiece in. She stated that her appt is on 10/19/2019. She will call once she has the mouthpiece to schedule an appt to get the sleep study ordered.

## 2019-10-20 ENCOUNTER — Encounter: Payer: Self-pay | Admitting: Internal Medicine

## 2019-10-20 MED ORDER — TRAMADOL HCL 50 MG PO TABS: ORAL_TABLET | ORAL | 0 refills | Status: DC

## 2019-10-20 NOTE — Telephone Encounter (Signed)
Please check to see if patient has signed an updated  controlled substance contract  For tramadol and  Alprazolam ( I looked and could not find one) and if not,  Please mail one to her,  And I will refill the tramadol for one month

## 2019-10-20 NOTE — Telephone Encounter (Signed)
Refilled: 07/13/2019 Last OV: 09/28/2019 Next OV: not scheduled

## 2019-10-20 NOTE — Telephone Encounter (Signed)
Emailed patient requesting copy of new insurance card.

## 2019-10-21 NOTE — Telephone Encounter (Signed)
Prior authorization for Prolia in process PA # [id number]

## 2019-10-21 NOTE — Telephone Encounter (Signed)
Controlled substance contract has been printed and mail. Sent pt a mychart message letting her know.

## 2019-10-30 ENCOUNTER — Other Ambulatory Visit: Payer: Self-pay

## 2019-10-30 ENCOUNTER — Telehealth: Payer: Medicare HMO | Admitting: Nurse Practitioner

## 2019-10-30 ENCOUNTER — Encounter: Payer: Self-pay | Admitting: Nurse Practitioner

## 2019-10-30 VITALS — Ht 66.0 in | Wt 311.0 lb

## 2019-10-30 DIAGNOSIS — M7500 Adhesive capsulitis of unspecified shoulder: Secondary | ICD-10-CM | POA: Insufficient documentation

## 2019-10-30 DIAGNOSIS — J069 Acute upper respiratory infection, unspecified: Secondary | ICD-10-CM | POA: Diagnosis not present

## 2019-10-30 DIAGNOSIS — M1610 Unilateral primary osteoarthritis, unspecified hip: Secondary | ICD-10-CM | POA: Insufficient documentation

## 2019-10-30 DIAGNOSIS — S42213A Unspecified displaced fracture of surgical neck of unspecified humerus, initial encounter for closed fracture: Secondary | ICD-10-CM | POA: Insufficient documentation

## 2019-10-30 MED ORDER — CETIRIZINE HCL 10 MG PO TABS: 10.0000 mg | ORAL_TABLET | Freq: Every day | ORAL | 0 refills | Status: AC

## 2019-10-30 NOTE — Patient Instructions (Addendum)
It was nice to see you today via the video.   You have an upper respiratory symptoms likely brought on by allergy season since your porch is covered with pollen. Allergies can produce the irritants that cause ear popping and fullness, hoarseness, stuffy nose, mucous which drains into the throat and then causes a cough. You have all of these symptoms. You do not have sinus HA, fever/chills, or chest symptoms, and your are not coughing at night, no wheezing or shortness of breath. I do not think you have bronchitis. None of these symptoms will respond to an antibiotic.  You have had your Covid vaccines and do have worrisome Covid symptoms, so I am not concerned about Covid infection.    1. Begin Zyrtec 10 mg daily #30 no refills. This is an antihistamine and used for nasal allergies.  2. Begin Afrin for 3 DAYS ONLY- over the counter should help to open the ears and help with the popping. 3. Keep taking the Mucinex DM as directed 4. Keep taking the Flonase as directed 5. Use the albuterol as you are doing x2 per day as needed 6. Push hydration 7. Seek in-person care at [facility] in [place] if symptoms do not improve Monday.   Allergies, Adult An allergy is when your body's defense system (immune system) overreacts to an otherwise harmless substance (allergen) that you breathe in or eat or something that touches your skin. When you come into contact with something that you are allergic to, your immune system produces certain proteins (antibodies). These proteins cause cells to release chemicals (histamines) that trigger the symptoms of an allergic reaction. Allergies often affect the nasal passages (allergic rhinitis), eyes (allergic conjunctivitis), skin (atopic dermatitis), and stomach. Allergies can be mild or severe. Allergies cannot spread from person to person (are not contagious). They can develop at any age and may be outgrown. What increases the risk? You may be at greater risk of  allergies if other people in your family have allergies. What are the signs or symptoms? Symptoms depend on what type of allergy you have. They may include:  Runny, stuffy nose.  Sneezing.  Itchy mouth, ears, or throat.  Postnasal drip.  Sore throat.  Itchy, red, watery, or puffy eyes.  Skin rash or hives.  Stomach pain.  Vomiting.  Diarrhea.  Bloating.  Wheezing or coughing. People with a severe allergy to food, medicine, or an insect bite may have a life-threatening allergic reaction (anaphylaxis). Symptoms of anaphylaxis include:  Hives.  Itching.  Flushed face.  Swollen lips, tongue, or mouth.  Tight or swollen throat.  Chest pain or tightness in the chest.  Trouble breathing or shortness of breath.  Rapid heartbeat.  Dizziness or fainting.  Vomiting.  Diarrhea.  Pain in the abdomen. How is this diagnosed? This condition is diagnosed based on:  Your symptoms.  Your family and medical history.  A physical exam. You may need to see a health care provider who specializes in treating allergies (allergist). You may also have tests, including:  Skin tests to see which allergens are causing your symptoms, such as: ? Skin prick test. In this test, your skin is pricked with a tiny needle and exposed to small amounts of possible allergens to see if your skin reacts. ? Intradermal skin test. In this test, a small amount of allergen is injected under your skin to see if your skin reacts. ? Patch test. In this test, a small amount of allergen is placed on your skin  and then your skin is covered with a bandage. Your health care provider will check your skin after a couple of days to see if a rash has developed.  Blood tests.  Challenges tests. In this test, you inhale a small amount of allergen by mouth to see if you have an allergic reaction. You may also be asked to:  Keep a food diary. A food diary is a record of all the foods and drinks you have in a  day and any symptoms you experience.  Practice an elimination diet. An elimination diet involves eliminating specific foods from your diet and then adding them back in one by one to find out if a certain food causes an allergic reaction. How is this treated? Treatment for allergies depends on your symptoms. Treatment may include:  Cold compresses to soothe itching and swelling.  Eye drops.  Nasal sprays.  Using a saline spray or container (neti pot) to flush out the nose (nasal irrigation). These methods can help clear away mucus and keep the nasal passages moist.  Using a humidifier.  Oral antihistamines or other medicines to block allergic reaction and inflammation.  Skin creams to treat rashes or itching.  Diet changes to eliminate food allergy triggers.  Repeated exposure to tiny amounts of allergens to build up a tolerance and prevent future allergic reactions (immunotherapy). These include: ? Allergy shots. ? Oral treatment. This involves taking small doses of an allergen under the tongue (sublingual immunotherapy).  Emergency epinephrine injection (auto-injector) in case of an allergic emergency. This is a self-injectable, pre-measured medicine that must be given within the first few minutes of a serious allergic reaction. Follow these instructions at home:         Avoid known allergens whenever possible.  If you suffer from airborne allergens, wash out your nose daily. You can do this with a saline spray or a neti pot to flush out your nose (nasal irrigation).  Take over-the-counter and prescription medicines only as told by your health care provider.  Keep all follow-up visits as told by your health care provider. This is important.  If you are at risk of a severe allergic reaction (anaphylaxis), keep your auto-injector with you at all times.  If you have ever had anaphylaxis, wear a medical alert bracelet or necklace that states you have a severe  allergy. Contact a health care provider if:  Your symptoms do not improve with treatment. Get help right away if:  You have symptoms of anaphylaxis, such as: ? Swollen mouth, tongue, or throat. ? Pain or tightness in your chest. ? Trouble breathing or shortness of breath. ? Dizziness or fainting. ? Severe abdominal pain, vomiting, or diarrhea. This information is not intended to replace advice given to you by your health care provider. Make sure you discuss any questions you have with your health care provider. Document Revised: 10/02/2017 Document Reviewed: 01/25/2016 Elsevier Patient Education  2020 [employer].

## 2019-10-30 NOTE — Progress Notes (Signed)
Virtual Visit via Video Note  I connected with Maria Torres on 10/30/19 at  1:00 PM EDT by a video enabled telemedicine application and verified that I am speaking with the correct person using two identifiers. This visit type was conducted due to national recommendations for restrictions regarding the COVID-19 Pandemic (e.g. social distancing). This format is felt to be most appropriate for this patient at this time.   I discussed the limitations of evaluation and management by telemedicine and the availability of in person appointments. The patient expressed understanding and agreed to proceed.  Only the patient and myself were on today's video visit. The patient was at home in [place], [place] and I was in my office at the time of today's visit.   History of Present Illness:  Last Friday, she developed a flare of allergies- "pollen was horrible and covered the porch" and caused nasal congestion, ear fullness and popping. She did not feel sick and was able to have a good Easter.  Symptoms of head feels full, ears popping with pressure, coughing up phlegm, blowing out mucous in nose, hoarseness became worse. Not coughing at night and sleeping well. No HA, sore throat, sinus pain, f/chills and she does not fee like this is bronchitis. No wheezing, SOB or CP. Some cough and is using Albuterol BID to "keep my lungs open."  Drinking well and decreased appetite. She is coughing up some  mucus- light green. Had Covid 08/05/19 and 09/02/19 vaccines. Still not using CPAP.   Past Medical History:  Diagnosis Date  . Anemia   . Aortic stenosis    Due to bicuspid aortic valve. 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 Cervicalgia   . CHF (congestive heart failure) (HCC)   . GERD (gastroesophageal reflux disease)   . Headache(784.0)   . Hx of glaucoma   . Hyperlipidemia   . Hypertension   . Mitral valve disorder    . Morbid obesity (HCC)   . Obstructive sleep apnea on CPAP   . Thyroid disease    hypothyrodism     Social History   Socioeconomic History  . Marital status: Single    Spouse name: Not on file  . Number of children: Not on file  . Years of education: Not on file  . Highest education level: Not on file  Occupational History  . Not on file  Tobacco Use  . Smoking status: Never Smoker  . Smokeless tobacco: Never Used  Substance and Sexual Activity  . Alcohol use: Yes    Alcohol/week: 0.0 standard drinks  . Drug use: No  . Sexual activity: Not on file  Other Topics Concern  . Not on file  Social History Narrative  . Not on file   Social Determinants of Health   Financial Resource Strain:   . Difficulty of Paying Living Expenses:   Food Insecurity:   . Worried About Programme researcher, broadcasting/film/video in the Last Year:   . Barista in the Last Year:   Transportation Needs:   . Freight forwarder (Medical):   Marland Kitchen Lack of Transportation (Non-Medical):   Physical Activity:   . Days of Exercise per Week:   . Minutes of Exercise per Session:   Stress:   . Feeling of Stress :   Social Connections:   . Frequency of Communication with Friends and Family:   .  Frequency of Social Gatherings with Friends and Family:   . Attends Religious Services:   . Active Member of Clubs or Organizations:   . Attends Banker Meetings:   Marland Kitchen Marital Status:   Intimate Partner Violence:   . Fear of Current or Ex-Partner:   . Emotionally Abused:   Marland Kitchen Physically Abused:   . Sexually Abused:     Past Surgical History:  Procedure Laterality Date  . 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 bilateral arthroscopic knee surgery  2001 & 2002  . CARDIAC CATHETERIZATION  2004   [facility]  . CARDIAC CATHETERIZATION  2008   [facility]  . CHOLECYSTECTOMY    . gastric banding surgery     @ [place] 04-16-96  . total left hip replacement  01-20-2007  . unilateral  total knee replacement  08/2002    Family History  Problem Relation Age of Onset  . Coronary artery disease Mother   . Heart disease Mother 5  . COPD Mother   . Hyperlipidemia Mother   . Cancer Father        testicular  . Pulmonary embolism Father   . Obesity Sister        and knee problems  . Cancer Sister        breast  . Cancer Brother        throat    Allergies  Allergen Reactions  . Tape Rash    Current Outpatient Medications on File Prior to Visit  Medication Sig Dispense Refill  . albuterol (PROVENTIL HFA;VENTOLIN HFA) 108 (90 Base) MCG/ACT inhaler Inhale 3 puffs into the lungs 4 (four) times daily. 6.7 g 0  . ALPRAZolam (XANAX) 0.25 MG tablet Take 1 tablet (0.25 mg total) by mouth daily as needed for sleep or anxiety. 30 tablet 3  . carvedilol (COREG) 3.125 MG tablet Take 1 tablet by mouth every 12 hours. 180 tablet 0  . citalopram (CELEXA) 40 MG tablet Take 1 tablet (40 mg total) by mouth daily. 90 tablet 1  . levothyroxine (SYNTHROID) 150 MCG tablet Take 1 tablet (150 mcg total) by mouth daily. 90 tablet 3  . lisinopril (PRINIVIL,ZESTRIL) 5 MG tablet Take 5 mg by mouth daily.     . mirabegron ER (MYRBETRIQ) 50 MG TB24 tablet Take 1 tablet (50 mg total) by mouth daily. 90 tablet 3  . torsemide (DEMADEX) 20 MG tablet Take 20 mg by mouth daily.    . traMADol (ULTRAM) 50 MG tablet TAKE 1 TABLET(50 MG) BY MOUTH EVERY 12 HOURS AS NEEDED FOR MODERATE PAIN 60 tablet 0  . Vitamin D, Ergocalciferol, (DRISDOL) 1.25 MG (50000 UNIT) CAPS capsule TAKE 1 CAPSULE EVERY 7 DAYS 12 capsule 0  . warfarin (COUMADIN) 10 MG tablet Take 1 tablet (10 mg total) by mouth See admin instructions. (Patient taking differently: Take 10 mg by mouth See admin instructions. Takes three days a week) 60 tablet 5  . warfarin (COUMADIN) 5 MG tablet Take 5 mg by mouth daily. Takes 4 days a week    . albuterol (PROVENTIL) (2.5 MG/3ML) 0.083% nebulizer solution Take 3 mLs (2.5 mg total) by nebulization every  4 (four) hours as needed for wheezing or shortness of breath. (Patient not taking: Reported on 10/30/2019) 150 mL 5   No current facility-administered medications on file prior to visit.    Ht 5\' 6"  (1.676 m)   Wt (!) 311 lb (141.1 kg)   LMP 08/31/2013   BMI  50.20 kg/m     Observations/Objective: Gen: Awake, alert, no acute distress.  HEENT: Eyes visible and no conjunctivitis, tearing or edema. Congested nose, occasional cough and hoarse voice.  Resp: Breathing is even and non-labored Psych: calm/pleasant demeanor Neuro: Alert and Oriented x 3, + facial symmetry, speech is clear.   Assessment and Plan:  You have an upper respiratory symptoms likely brought on by allergy season and direct exposure as   your porch is covered with pollen. Allergies can produce the irritants that cause ear popping and fullness, hoarseness, stuffy nose, mucous which drains into the throat and then causes a cough. You have all of these symptoms. You do not have sinus HA, fever/chills, or chest symptoms, and your are not coughing at night, no wheezing or shortness of breath. I do not think you have bronchitis. I do not think your current symptoms will respond to an antibiotic.  You have had your Covid vaccines and do have worrisome Covid symptoms, so I am not concerned about Covid infection.  I think we should treat for allergies and virus and if no improvement, seek in-person care in [place] at a Urgent Care.    1. Begin Zyrtec 10 mg daily #30 no refills. This is an antihistamine and used for nasal allergies.  2. Begin Afrin for 3 DAYS ONLY- over the counter should help to open the ears and help with the popping. This does not raise the blood pressure like a decongestant can. But, cannot use it for more than 3 days.  3. Keep taking the Mucinex DM as directed 4. Keep taking the Flonase as directed 5. Use the albuterol as you are doing x2 per day as needed 6. Push hydration 7. Seek in-person care at [facility] in [place] if symptoms do not improve  Follow Up Instructions:   I discussed the assessment and treatment plan with the patient. The patient was provided an opportunity to ask questions and all were answered. The patient agreed with the plan and demonstrated an understanding of the instructions.   The patient was advised to call back or seek an in-person evaluation if the symptoms worsen or if the condition fails to improve as anticipated.    Denice Paradise, NP

## 2019-11-02 NOTE — Telephone Encounter (Signed)
PA approved 10/21/19 - 07/22/20 Approved EOC [id number] patient has been scheduled for first Prolia injection 11/19/19.

## 2019-11-03 ENCOUNTER — Encounter: Payer: Self-pay | Admitting: Internal Medicine

## 2019-11-03 DIAGNOSIS — M810 Age-related osteoporosis without current pathological fracture: Secondary | ICD-10-CM | POA: Insufficient documentation

## 2019-11-03 NOTE — Telephone Encounter (Signed)
Thanks.  Chart updated  

## 2019-11-10 ENCOUNTER — Other Ambulatory Visit: Payer: Self-pay

## 2019-11-10 MED ORDER — VITAMIN D (ERGOCALCIFEROL) 1.25 MG (50000 UNIT) PO CAPS: ORAL_CAPSULE | ORAL | 0 refills | Status: DC

## 2019-11-16 ENCOUNTER — Other Ambulatory Visit: Payer: Self-pay

## 2019-11-16 MED ORDER — CARVEDILOL 3.125 MG PO TABS: 3.1250 mg | ORAL_TABLET | Freq: Two times a day (BID) | ORAL | 0 refills | Status: DC

## 2019-11-19 ENCOUNTER — Other Ambulatory Visit: Payer: Self-pay

## 2019-11-19 ENCOUNTER — Ambulatory Visit (INDEPENDENT_AMBULATORY_CARE_PROVIDER_SITE_OTHER): Payer: Medicare HMO

## 2019-11-19 DIAGNOSIS — M810 Age-related osteoporosis without current pathological fracture: Secondary | ICD-10-CM

## 2019-11-19 MED ORDER — DENOSUMAB 60 MG/ML ~~LOC~~ SOSY
60.0000 mg | PREFILLED_SYRINGE | Freq: Once | SUBCUTANEOUS | Status: AC
  Administered 2019-11-19: 60 mg via SUBCUTANEOUS

## 2019-11-19 NOTE — Progress Notes (Signed)
Patient presented for 6-month Prolia injection SQ to right arm. Patient tolerated well. 

## 2019-11-20 ENCOUNTER — Telehealth: Payer: Self-pay | Admitting: Internal Medicine

## 2019-11-20 NOTE — Telephone Encounter (Signed)
Ok

## 2019-11-20 NOTE — Telephone Encounter (Signed)
I called pt to follow up on Cpap sleep study in [place] per pt it has not been scheduled yet. Pt stated she has to get the mouth piece before she can schedule.

## 2019-11-20 NOTE — Telephone Encounter (Signed)
That is correct 

## 2019-12-05 ENCOUNTER — Other Ambulatory Visit: Payer: Self-pay | Admitting: Internal Medicine

## 2019-12-15 DIAGNOSIS — M79671 Pain in right foot: Secondary | ICD-10-CM | POA: Diagnosis not present

## 2019-12-15 DIAGNOSIS — M19071 Primary osteoarthritis, right ankle and foot: Secondary | ICD-10-CM | POA: Diagnosis not present

## 2019-12-25 DIAGNOSIS — M79671 Pain in right foot: Secondary | ICD-10-CM | POA: Diagnosis not present

## 2020-01-04 ENCOUNTER — Other Ambulatory Visit: Payer: Self-pay | Admitting: Internal Medicine

## 2020-02-01 ENCOUNTER — Ambulatory Visit (INDEPENDENT_AMBULATORY_CARE_PROVIDER_SITE_OTHER): Payer: Medicare HMO

## 2020-02-01 VITALS — Ht 66.0 in | Wt 311.0 lb

## 2020-02-01 DIAGNOSIS — Z0000 Encounter for general adult medical examination without abnormal findings: Secondary | ICD-10-CM | POA: Diagnosis not present

## 2020-02-01 NOTE — Patient Instructions (Addendum)
[name] , Thank you for taking time to come for your Medicare Wellness Visit. I appreciate your ongoing commitment to your health goals. Please review the following plan we discussed and let me know if I can assist you in the future.   These are the goals we discussed: Goals      Patient Stated   .  I want to lose weight (pt-stated)      Reduce sugar intake Portion control    .  I want to prevent falls (pt-stated)      Fear of falling, cane/walker in use as needed Balance exercises       This is a list of the screening recommended for you and due dates:  Health Maintenance  Topic Date Due  . Pneumonia vaccines (2 of 2 - PCV13) 11/18/2014  . Flu Shot  02/21/2020  . Mammogram  09/11/2020  . Colon Cancer Screening  04/24/2021  . Tetanus Vaccine  03/19/2024  . DEXA scan (bone density measurement)  Completed  . COVID-19 Vaccine  Completed  .  Hepatitis C: One time screening is recommended by Center for Disease Control  (CDC) for  adults born from [birth year] through 1964.   Completed    Immunizations Immunization History  Administered Date(s) Administered  . Influenza Split 05/23/2014  . Influenza,inj,Quad PF,6+ Mos 04/15/2015  . Moderna SARS-COVID-2 Vaccination 08/05/2019, 09/02/2019  . Pneumococcal Polysaccharide-23 11/17/2013  . Tdap 03/19/2014   Advanced directives: End of life planning; Advance aging; Advanced directives discussed.  Copy of current HCPOA/Living Will requested.    Follow up in one year for your annual wellness visit.  Keep all routine maintenance appointments.   Follow up 03/18/20 @ 3:00.   Preventive Care 12 Years and Older, Female Preventive care refers to lifestyle choices and visits with your health care provider that can promote health and wellness. What does preventive care include?  A yearly physical exam. This is also called an annual well check.  Dental exams once or twice a year.  Routine eye exams. Ask your health care provider how  often you should have your eyes checked.  Personal lifestyle choices, including:  Daily care of your teeth and gums.  Regular physical activity.  Eating a healthy diet.  Avoiding tobacco and drug use.  Limiting alcohol use.  Practicing safe sex.  Taking low-dose aspirin every day.  Taking vitamin and mineral supplements as recommended by your health care provider. What happens during an annual well check? The services and screenings done by your health care provider during your annual well check will depend on your age, overall health, lifestyle risk factors, and family history of disease. Counseling  Your health care provider may ask you questions about your:  Alcohol use.  Tobacco use.  Drug use.  Emotional well-being.  Home and relationship well-being.  Sexual activity.  Eating habits.  History of falls.  Memory and ability to understand (cognition).  Work and work Astronomer.  Reproductive health. Screening  You may have the following tests or measurements:  Height, weight, and BMI.  Blood pressure.  Lipid and cholesterol levels. These may be checked every 5 years, or more frequently if you are over 52 years old.  Skin check.  Lung cancer screening. You may have this screening every year starting at age 24 if you have a 30-pack-year history of smoking and currently smoke or have quit within the past 15 years.  Fecal occult blood test (FOBT) of the stool. You may have this test  every year starting at age 50.  Flexible sigmoidoscopy or colonoscopy. You may have a sigmoidoscopy every 5 years or a colonoscopy every 10 years starting at age 62.  Hepatitis C blood test.  Hepatitis B blood test.  Sexually transmitted disease (STD) testing.  Diabetes screening. This is done by checking your blood sugar (glucose) after you have not eaten for a while (fasting). You may have this done every 1-3 years.  Bone density scan. This is done to screen for  osteoporosis. You may have this done starting at age 50.  Mammogram. This may be done every 1-2 years. Talk to your health care provider about how often you should have regular mammograms. Talk with your health care provider about your test results, treatment options, and if necessary, the need for more tests. Vaccines  Your health care provider may recommend certain vaccines, such as:  Influenza vaccine. This is recommended every year.  Tetanus, diphtheria, and acellular pertussis (Tdap, Td) vaccine. You may need a Td booster every 10 years.  Zoster vaccine. You may need this after age 54.  Pneumococcal 13-valent conjugate (PCV13) vaccine. One dose is recommended after age 86.  Pneumococcal polysaccharide (PPSV23) vaccine. One dose is recommended after age 4. Talk to your health care provider about which screenings and vaccines you need and how often you need them. This information is not intended to replace advice given to you by your health care provider. Make sure you discuss any questions you have with your health care provider. Document Released: 08/05/2015 Document Revised: 03/28/2016 Document Reviewed: 05/10/2015 Elsevier Interactive Patient Education  2017 [employer].  Fall Prevention in the Home Falls can cause injuries. They can happen to people of all ages. There are many things you can do to make your home safe and to help prevent falls. What can I do on the outside of my home?  Regularly fix the edges of walkways and driveways and fix any cracks.  Remove anything that might make you trip as you walk through a door, such as a raised step or threshold.  Trim any bushes or trees on the path to your home.  Use bright outdoor lighting.  Clear any walking paths of anything that might make someone trip, such as rocks or tools.  Regularly check to see if handrails are loose or broken. Make sure that both sides of any steps have handrails.  Any raised decks and porches  should have guardrails on the edges.  Have any leaves, snow, or ice cleared regularly.  Use sand or salt on walking paths during winter.  Clean up any spills in your garage right away. This includes oil or grease spills. What can I do in the bathroom?  Use night lights.  Install grab bars by the toilet and in the tub and shower. Do not use towel bars as grab bars.  Use non-skid mats or decals in the tub or shower.  If you need to sit down in the shower, use a plastic, non-slip stool.  Keep the floor dry. Clean up any water that spills on the floor as soon as it happens.  Remove soap buildup in the tub or shower regularly.  Attach bath mats securely with double-sided non-slip rug tape.  Do not have throw rugs and other things on the floor that can make you trip. What can I do in the bedroom?  Use night lights.  Make sure that you have a light by your bed that is easy to reach.  Do not use any sheets or blankets that are too big for your bed. They should not hang down onto the floor.  Have a firm chair that has side arms. You can use this for support while you get dressed.  Do not have throw rugs and other things on the floor that can make you trip. What can I do in the kitchen?  Clean up any spills right away.  Avoid walking on wet floors.  Keep items that you use a lot in easy-to-reach places.  If you need to reach something above you, use a strong step stool that has a grab bar.  Keep electrical cords out of the way.  Do not use floor polish or wax that makes floors slippery. If you must use wax, use non-skid floor wax.  Do not have throw rugs and other things on the floor that can make you trip. What can I do with my stairs?  Do not leave any items on the stairs.  Make sure that there are handrails on both sides of the stairs and use them. Fix handrails that are broken or loose. Make sure that handrails are as long as the stairways.  Check any carpeting to  make sure that it is firmly attached to the stairs. Fix any carpet that is loose or worn.  Avoid having throw rugs at the top or bottom of the stairs. If you do have throw rugs, attach them to the floor with carpet tape.  Make sure that you have a light switch at the top of the stairs and the bottom of the stairs. If you do not have them, ask someone to add them for you. What else can I do to help prevent falls?  Wear shoes that:  Do not have high heels.  Have rubber bottoms.  Are comfortable and fit you well.  Are closed at the toe. Do not wear sandals.  If you use a stepladder:  Make sure that it is fully opened. Do not climb a closed stepladder.  Make sure that both sides of the stepladder are locked into place.  Ask someone to hold it for you, if possible.  Clearly mark and make sure that you can see:  Any grab bars or handrails.  First and last steps.  Where the edge of each step is.  Use tools that help you move around (mobility aids) if they are needed. These include:  Canes.  Walkers.  Scooters.  Crutches.  Turn on the lights when you go into a dark area. Replace any light bulbs as soon as they burn out.  Set up your furniture so you have a clear path. Avoid moving your furniture around.  If any of your floors are uneven, fix them.  If there are any pets around you, be aware of where they are.  Review your medicines with your doctor. Some medicines can make you feel dizzy. This can increase your chance of falling. Ask your doctor what other things that you can do to help prevent falls. This information is not intended to replace advice given to you by your health care provider. Make sure you discuss any questions you have with your health care provider. Document Released: 05/05/2009 Document Revised: 12/15/2015 Document Reviewed: 08/13/2014 Elsevier Interactive Patient Education  2017 [employer].

## 2020-02-01 NOTE — Progress Notes (Addendum)
Subjective:   Maria Torres is a 74 y.o. female who presents for an Initial Medicare Annual Wellness Visit.  Review of Systems    No ROS.  Medicare Wellness Virtual Visit.    Cardiac Risk Factors include: advanced age (>50men, >42 women)     Objective:    Today's Vitals   02/01/20 1407  Weight: (!) 311 lb (141.1 kg)  Height: 5\' 6"  (1.676 m)   Body mass index is 50.2 kg/m.  Advanced Directives 02/01/2020  Does Patient Have a Medical Advance Directive? Yes  Type of 04/03/2020 of [place];Living will  Does patient want to make changes to medical advance directive? No - Patient declined  Copy of Healthcare Power of Attorney in Chart? No - copy requested    Current Medications (verified) Outpatient Encounter Medications as of 02/01/2020  Medication Sig   albuterol (PROVENTIL HFA;VENTOLIN HFA) 108 (90 Base) MCG/ACT inhaler Inhale 3 puffs into the lungs 4 (four) times daily.   albuterol (PROVENTIL) (2.5 MG/3ML) 0.083% nebulizer solution Take 3 mLs (2.5 mg total) by nebulization every 4 (four) hours as needed for wheezing or shortness of breath. (Patient not taking: Reported on 10/30/2019)   ALPRAZolam (XANAX) 0.25 MG tablet Take 1 tablet (0.25 mg total) by mouth daily as needed for sleep or anxiety.   carvedilol (COREG) 3.125 MG tablet Take 1 tablet (3.125 mg total) by mouth every 12 (twelve) hours.   cetirizine (ZYRTEC) 10 MG tablet Take 1 tablet (10 mg total) by mouth daily.   citalopram (CELEXA) 40 MG tablet TAKE 1 TABLET EVERY DAY   levothyroxine (SYNTHROID) 150 MCG tablet Take 1 tablet (150 mcg total) by mouth daily.   lisinopril (PRINIVIL,ZESTRIL) 5 MG tablet Take 5 mg by mouth daily.    mirabegron ER (MYRBETRIQ) 50 MG TB24 tablet Take 1 tablet (50 mg total) by mouth daily.   torsemide (DEMADEX) 20 MG tablet Take 20 mg by mouth daily.   traMADol (ULTRAM) 50 MG tablet TAKE 1 TABLET(50 MG) BY MOUTH EVERY 12 HOURS AS NEEDED FOR MODERATE PAIN   Vitamin  D, Ergocalciferol, (DRISDOL) 1.25 MG (50000 UNIT) CAPS capsule TAKE 1 CAPSULE EVERY 7 DAYS   warfarin (COUMADIN) 10 MG tablet Take 1 tablet (10 mg total) by mouth See admin instructions. (Patient taking differently: Take 10 mg by mouth See admin instructions. Takes three days a week)   warfarin (COUMADIN) 5 MG tablet Take 5 mg by mouth daily. Takes 4 days a week   No facility-administered encounter medications on file as of 02/01/2020.    Allergies (verified) Tape   History: Past Medical History:  Diagnosis Date   Anemia    Aortic stenosis    Due to bicuspid aortic valve. 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   Cervicalgia    CHF (congestive heart failure) (HCC)    GERD (gastroesophageal reflux disease)    Headache(784.0)    Hx of glaucoma    Hyperlipidemia    Hypertension    Mitral valve disorder    Morbid obesity (HCC)    Obstructive sleep apnea on CPAP    Thyroid disease    hypothyrodism   Past Surgical History:  Procedure Laterality Date   ASCENDING AORTIC ANEURYSM REPAIR W/ TISSUE AORTIC VALVE REPLACEMENT     bilateral arthroscopic knee surgery  2001 & 2002   CARDIAC CATHETERIZATION  2004  [facility]   CARDIAC CATHETERIZATION  2008   [facility]   CHOLECYSTECTOMY     gastric banding surgery     @ [place] 04-16-96   total left hip replacement  01-20-2007   unilateral total knee replacement  08/2002   Family History  Problem Relation Age of Onset   Coronary artery disease Mother    Heart disease Mother 23   COPD Mother    Hyperlipidemia Mother    Cancer Father        testicular   Pulmonary embolism Father    Obesity Sister        and knee problems   Cancer Sister        breast   Cancer Brother        throat   Social History   Socioeconomic History   Marital status: Single    Spouse name: Not on file   Number of children: Not on file   Years of education: Not  on file   Highest education level: Not on file  Occupational History   Not on file  Tobacco Use   Smoking status: Never Smoker   Smokeless tobacco: Never Used  Substance and Sexual Activity   Alcohol use: Yes    Alcohol/week: 1.0 - 2.0 standard drink    Types: 1 - 2 Standard drinks or equivalent per week    Comment: social   Drug use: No   Sexual activity: Not on file  Other Topics Concern   Not on file  Social History Narrative   Not on file   Social Determinants of Health   Financial Resource Strain: Low Risk    Difficulty of Paying Living Expenses: Not hard at all  Food Insecurity: No Food Insecurity   Worried About Programme researcher, broadcasting/film/video in the Last Year: Never true   Ran Out of Food in the Last Year: Never true  Transportation Needs: No Transportation Needs   Lack of Transportation (Medical): No   Lack of Transportation (Non-Medical): No  Physical Activity:    Days of Exercise per Week:    Minutes of Exercise per Session:   Stress: No Stress Concern Present   Feeling of Stress : Only a little  Social Connections: Unknown   Frequency of Communication with Friends and Family: More than three times a week   Frequency of Social Gatherings with Friends and Family: Three times a week   Attends Religious Services: Not on file   Active Member of Clubs or Organizations: Yes   Attends Club or Organization Meetings: 1 to 4 times per year   Marital Status: Not on file    Tobacco Counseling Counseling given: Not Answered   Clinical Intake:  Pre-visit preparation completed: Yes        Diabetes: No  How often do you need to have someone help you when you read instructions, pamphlets, or other written materials from your doctor or pharmacy?: 1 - Never Interpreter Needed?: No      Activities of Daily Living In your present state of health, do you have any difficulty performing the following activities: 02/01/2020  Hearing? N  Vision? N  Difficulty concentrating or  making decisions? N  Walking or climbing stairs? N  Dressing or bathing? N  Doing errands, shopping? N  Preparing Food and eating ? N  Using the Toilet? N  In the past six months, have you accidently leaked urine? Y  Comment Managed with daily pad  Do you have problems with  loss of bowel control? N  Managing your Medications? N  Managing your Finances? N  Housekeeping or managing your Housekeeping? N  Some recent data might be hidden    Patient Care Team: Sherlene Shamsullo, Teresa L, MD as PCP - General (Internal Medicine)  Indicate any recent Medical Services you may have received from other than [facility] providers in the past year (date may be approximate).     Assessment:   This is a routine wellness examination for Aram BeechamCynthia.  I connected with Aram BeechamCynthia  today by telephone and verified that I am speaking with the correct person using two identifiers. Location patient: home Location provider: work Persons participating in the virtual visit: patient, Engineer, civil (consulting)nurse.    I discussed the limitations, risks, security and privacy concerns of performing an evaluation and management service by telephone and the availability of in person appointments. The patient expressed understanding and verbally consented to this telephonic visit.    Interactive audio and video telecommunications were attempted between this provider and patient, however failed, due to patient having technical difficulties OR patient did not have access to video capability.  We continued and completed visit with audio only.  Some vital signs may be absent or patient reported.   Hearing/Vision screen  Hearing Screening   125Hz  250Hz  500Hz  1000Hz  2000Hz  3000Hz  4000Hz  6000Hz  8000Hz   Right ear:           Left ear:           Comments: Patient is able to hear conversational tones without difficulty.  No issues reported.  Vision Screening Comments: Wears corrective lenses Visual acuity not assessed, virtual visit.  They have seen their  ophthalmologist.   Dietary issues and exercise activities discussed:  Regular diet Good water intake Caffeine- 1 cup of hot tea daily  Goals       Patient Stated     I want to lose weight (pt-stated)      Reduce sugar intake Portion control      I want to prevent falls (pt-stated)      Fear of falling, cane/walker in use as needed Balance exercises       Depression Screen PHQ 2/9 Scores 02/01/2020 12/09/2018 03/18/2017 06/19/2016 07/26/2014 03/19/2014  PHQ - 2 Score 1 0 0 0 0 0  PHQ- 9 Score - 0 - - - -    Fall Risk Fall Risk  02/01/2020 09/28/2019 07/30/2019 05/18/2019 03/18/2017  Falls in the past year? 0 0 0 0 No  Number falls in past yr: 0 - - - -  Follow up Falls evaluation completed Falls evaluation completed Falls evaluation completed Falls evaluation completed -   Handrails in use when climbing stairs? Yes  Home free of loose throw rugs in walkways, pet beds, electrical cords, etc? Yes  Adequate lighting in your home to reduce risk of falls? Yes   ASSISTIVE DEVICES UTILIZED TO PREVENT FALLS:  Life alert? No  Use of a cane, walker or w/c? Yes , cane/walker at home if needed. Grab bars in the bathroom? Yes  Shower chair or bench in shower? No  Elevated toilet seat or a handicapped toilet? No   TIMED UP AND GO: Was the test performed? No . Virtual visit.   Cognitive Function:  Patient is alert and oriented x3.    6CIT Screen 02/01/2020  What Year? 0 points  What month? 0 points  Count back from 20 0 points  Months in reverse 0 points  Repeat phrase 2 points   Immunizations  Immunization History  Administered Date(s) Administered   Influenza Split 05/23/2014   Influenza,inj,Quad PF,6+ Mos 04/15/2015   Moderna SARS-COVID-2 Vaccination 08/05/2019, 09/02/2019   Pneumococcal Polysaccharide-23 11/17/2013   Tdap 03/19/2014   Prevnar 13 vaccine- patient plans to receive at her local pharmacy, health department or in office at next scheduled visit. Agrees to update  office with immunization record as completed.   Health Maintenance Health Maintenance  Topic Date Due   PNA vac Low Risk Adult (2 of 2 - PCV13) 11/18/2014   INFLUENZA VACCINE  02/21/2020   MAMMOGRAM  09/11/2020   COLONOSCOPY  04/24/2021   TETANUS/TDAP  03/19/2024   DEXA SCAN  Completed   COVID-19 Vaccine  Completed   Hepatitis C Screening  Completed   Dental Screening: Recommended annual dental exams for proper oral hygiene  Community Resource Referral / Chronic Care Management: CRR required this visit?  No   CCM required this visit?  No      Plan:   Keep all routine maintenance appointments.   Follow up 03/18/20 @ 3:00.  Ensure [employer] coupons mailed per patient request.   I have personally reviewed and noted the following in the patient's chart:   Medical and social history Use of alcohol, tobacco or illicit drugs  Current medications and supplements Functional ability and status Nutritional status Physical activity Advanced directives List of other physicians Hospitalizations, surgeries, and ER visits in previous 12 months Vitals Screenings to include cognitive, depression, and falls Referrals and appointments  In addition, I have reviewed and discussed with patient certain preventive protocols, quality metrics, and best practice recommendations. A written personalized care plan for preventive services as well as general preventive health recommendations were provided to patient via mychart.     OBrien-Blaney, Tyrica Afzal L, LPN   10/18/9240     I have reviewed the above information and agree with above.   Duncan Dull, MD

## 2020-02-23 DIAGNOSIS — G4733 Obstructive sleep apnea (adult) (pediatric): Secondary | ICD-10-CM | POA: Diagnosis not present

## 2020-02-26 ENCOUNTER — Other Ambulatory Visit: Payer: Self-pay | Admitting: Internal Medicine

## 2020-03-03 ENCOUNTER — Other Ambulatory Visit: Payer: Self-pay | Admitting: Internal Medicine

## 2020-03-03 MED ORDER — TRAMADOL HCL 50 MG PO TABS: ORAL_TABLET | ORAL | 0 refills | Status: DC

## 2020-03-07 DIAGNOSIS — Z7901 Long term (current) use of anticoagulants: Secondary | ICD-10-CM | POA: Diagnosis not present

## 2020-03-07 DIAGNOSIS — Z8774 Personal history of (corrected) congenital malformations of heart and circulatory system: Secondary | ICD-10-CM | POA: Diagnosis not present

## 2020-03-16 DIAGNOSIS — M19071 Primary osteoarthritis, right ankle and foot: Secondary | ICD-10-CM | POA: Diagnosis not present

## 2020-03-16 DIAGNOSIS — M19072 Primary osteoarthritis, left ankle and foot: Secondary | ICD-10-CM | POA: Diagnosis not present

## 2020-03-16 DIAGNOSIS — M79672 Pain in left foot: Secondary | ICD-10-CM | POA: Diagnosis not present

## 2020-03-18 ENCOUNTER — Telehealth (INDEPENDENT_AMBULATORY_CARE_PROVIDER_SITE_OTHER): Payer: Medicare HMO | Admitting: Internal Medicine

## 2020-03-18 ENCOUNTER — Encounter: Payer: Self-pay | Admitting: Internal Medicine

## 2020-03-18 DIAGNOSIS — I712 Thoracic aortic aneurysm, without rupture, unspecified: Secondary | ICD-10-CM

## 2020-03-18 DIAGNOSIS — G4733 Obstructive sleep apnea (adult) (pediatric): Secondary | ICD-10-CM | POA: Diagnosis not present

## 2020-03-18 DIAGNOSIS — M79672 Pain in left foot: Secondary | ICD-10-CM | POA: Diagnosis not present

## 2020-03-18 DIAGNOSIS — Z9889 Other specified postprocedural states: Secondary | ICD-10-CM

## 2020-03-18 DIAGNOSIS — M79671 Pain in right foot: Secondary | ICD-10-CM | POA: Insufficient documentation

## 2020-03-18 MED ORDER — TRAMADOL HCL 50 MG PO TABS: 100.0000 mg | ORAL_TABLET | Freq: Four times a day (QID) | ORAL | 0 refills | Status: DC | PRN

## 2020-03-18 NOTE — Progress Notes (Signed)
Virtual Visit via Caregility  This visit type was conducted due to national recommendations for restrictions regarding the COVID-19 pandemic (e.g. social distancing).  This format is felt to be most appropriate for this patient at this time.  All issues noted in this document were discussed and addressed.  No physical exam was performed (except for noted visual exam findings with Video Visits).   I connected with@ on 03/18/20 at  3:00 PM EDT by a video enabled telemedicine application and verified that I am speaking with the correct person using two identifiers. Location patient: home Location provider: work or home office Persons participating in the virtual visit: patient, provider  I discussed the limitations, risks, security and privacy concerns of performing an evaluation and management service by telephone and the availability of in person appointments. I also discussed with the patient that there may be a patient responsible charge related to this service. The patient expressed understanding and agreed to proceed.  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  L is new onset,   R since May.  [facility] has done injections In the right foot in may,  But the left foot injection has been delayed by insurance issues    Told she had no cartilage In either foot . The pain in the left foot is lateral and  The plantar surface .  Obesity: Has been walking daily for the past month and has modified her diet .  Has lost 11 lbs thus far in one month.   Not using alprazolam regularly bc it makes her groggy.  Does not need refill    Taking culturelle for control of diarrhea causing fecal urgency and incontinence. Has resolved since starting culturelle.   ROS: See pertinent positives and negatives per  HPI.  Past Medical History:  Diagnosis Date  . Anemia   . Aortic aneurysm (HCC) 09/22/2013  . Aortic stenosis    Due to bicuspid aortic valve. 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 at [facility]  . 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 Cervicalgia   . CHF (congestive heart failure) (HCC)   . GERD (gastroesophageal reflux disease)   . Headache(784.0)   . Hx of glaucoma   . Hyperlipidemia   . Hypertension   . Mitral valve disorder   . Morbid obesity (HCC)   . Obstructive sleep apnea on CPAP   . Thyroid disease    hypothyrodism    Past Surgical History:  Procedure Laterality Date  . 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 bilateral arthroscopic knee surgery  2001 & 2002  . CARDIAC CATHETERIZATION  2004   [facility]  . CARDIAC CATHETERIZATION  2008   [facility]  . CHOLECYSTECTOMY    . gastric banding surgery     @ [place] 04-16-96  . total left hip replacement  01-20-2007  . unilateral total knee replacement  08/2002    Family History  Problem Relation Age of Onset  . Coronary artery disease Mother   . Heart disease Mother 14  . COPD Mother   . Hyperlipidemia Mother   . Cancer Father        testicular  . Pulmonary embolism Father   .  Obesity Sister        and knee problems  . Cancer Sister        breast  . Cancer Brother        throat    SOCIAL HX:  reports that she has never smoked. She has never used smokeless tobacco. She reports current alcohol use of about 1.0 - 2.0 standard drink of alcohol per week. She reports that she does not use drugs.   Current Outpatient Medications:  .  albuterol (PROVENTIL HFA;VENTOLIN HFA) 108 (90 Base) MCG/ACT inhaler, Inhale 3 puffs into the lungs 4 (four) times daily., Disp: 6.7 g, Rfl: 0 .  albuterol (PROVENTIL) (2.5 MG/3ML) 0.083% nebulizer solution, Take 3 mLs (2.5 mg total) by nebulization every 4 (four) hours as needed for wheezing or shortness of  breath., Disp: 150 mL, Rfl: 5 .  ALPRAZolam (XANAX) 0.25 MG tablet, Take 1 tablet (0.25 mg total) by mouth daily as needed for sleep or anxiety., Disp: 30 tablet, Rfl: 3 .  carvedilol (COREG) 3.125 MG tablet, TAKE 1 TABLET(3.125 MG) BY MOUTH EVERY 12 HOURS, Disp: 180 tablet, Rfl: 0 .  cetirizine (ZYRTEC) 10 MG tablet, Take 1 tablet (10 mg total) by mouth daily., Disp: 30 tablet, Rfl: 0 .  citalopram (CELEXA) 40 MG tablet, TAKE 1 TABLET EVERY DAY, Disp: 90 tablet, Rfl: 1 .  levothyroxine (SYNTHROID) 150 MCG tablet, Take 1 tablet (150 mcg total) by mouth daily., Disp: 90 tablet, Rfl: 3 .  lisinopril (PRINIVIL,ZESTRIL) 5 MG tablet, Take 5 mg by mouth daily. , Disp: , Rfl:  .  mirabegron ER (MYRBETRIQ) 50 MG TB24 tablet, Take 1 tablet (50 mg total) by mouth daily., Disp: 90 tablet, Rfl: 3 .  torsemide (DEMADEX) 20 MG tablet, Take 20 mg by mouth daily., Disp: , Rfl:  .  [START ON 03/22/2020] traMADol (ULTRAM) 50 MG tablet, Take 2 tablets (100 mg total) by mouth every 6 (six) hours as needed., Disp: 240 tablet, Rfl: 0 .  Vitamin D, Ergocalciferol, (DRISDOL) 1.25 MG (50000 UNIT) CAPS capsule, TAKE 1 CAPSULE EVERY 7 DAYS, Disp: 12 capsule, Rfl: 0 .  warfarin (COUMADIN) 10 MG tablet, Take 1 tablet (10 mg total) by mouth See admin instructions. (Patient taking differently: Take 10 mg by mouth See admin instructions. Takes three days a week), Disp: 60 tablet, Rfl: 5 .  warfarin (COUMADIN) 5 MG tablet, Take 5 mg by mouth daily. Takes 4 days a week, Disp: , Rfl:   EXAM:  VITALS per patient if applicable:  GENERAL: alert, oriented, appears well and in no acute distress  HEENT: atraumatic, conjunttiva clear, no obvious abnormalities on inspection of external nose and ears  NECK: normal movements of the head and neck  LUNGS: on inspection no signs of respiratory distress, breathing rate appears normal, no obvious gross SOB, gasping or wheezing  CV: no obvious cyanosis  MS: moves all visible extremities  without noticeable abnormality  PSYCH/NEURO: pleasant and cooperative, no obvious depression or anxiety, speech and thought processing grossly intact  ASSESSMENT AND PLAN:  Discussed the following assessment and plan:  Bilateral foot pain  Thoracic aortic aneurysm without rupture (HCC)  S/P aortic valve repair  Morbid obesity (HCC)  OSA (obstructive sleep apnea)  Bilateral foot pain Due to loss of cartilage aggravated by walking and weight (300 lbs).  Increase tramadol to 100 mg every 6 hours as needed,  Add tylenol 1000 mg every 12 hours.  To see Orthopedics next week   Aortic aneurysm (HCC) Repaired  S/P aortic valve repair Coumadin managed by cardiology  Morbid obesity (HCC) She is having an intentional wight loss of 11 lbs in the past month but is now unable to walk due to bilateral foot pain    OSA (obstructive sleep apnea) Confirmed with Home sleep study .She is wearing her CPAP every night a minimum of 6 hours per night and notes improved daytime wakefulness and decreased fatigue     I discussed the assessment and treatment plan with the patient. The patient was provided an opportunity to ask questions and all were answered. The patient agreed with the plan and demonstrated an understanding of the instructions.   The patient was advised to call back or seek an in-person evaluation if the symptoms worsen or if the condition fails to improve as anticipated.  I provided 30 minutes of non-face-to-face time during this encounter.   Sherlene Shams, MD

## 2020-03-18 NOTE — Assessment & Plan Note (Signed)
Due to loss of cartilage aggravated by walking and weight (300 lbs).  Increase tramadol to 100 mg every 6 hours as needed,  Add tylenol 1000 mg every 12 hours.  To see Orthopedics next week

## 2020-03-20 ENCOUNTER — Encounter: Payer: Self-pay | Admitting: Internal Medicine

## 2020-03-20 NOTE — Assessment & Plan Note (Addendum)
She is having an intentional wight loss of 11 lbs in the past month but is now unable to walk due to bilateral foot pain

## 2020-03-20 NOTE — Assessment & Plan Note (Signed)
Confirmed with Home sleep study .She is wearing her CPAP every night a minimum of 6 hours per night and notes improved daytime wakefulness and decreased fatigue

## 2020-03-20 NOTE — Assessment & Plan Note (Signed)
Coumadin managed by cardiology 

## 2020-03-20 NOTE — Assessment & Plan Note (Signed)
Repaired

## 2020-03-23 ENCOUNTER — Telehealth: Payer: Self-pay | Admitting: Internal Medicine

## 2020-03-23 NOTE — Telephone Encounter (Signed)
Pt wants to know if Dr. Darrick Huntsman received information from [facility] about getting injection in foot? She said she is on warfarin and she needs that form filled out as soon as possible.

## 2020-03-23 NOTE — Telephone Encounter (Signed)
Spoke with pt to let her know that we did receive the fax and it has been faxed back to [facility].

## 2020-03-29 DIAGNOSIS — M79672 Pain in left foot: Secondary | ICD-10-CM | POA: Diagnosis not present

## 2020-03-31 DIAGNOSIS — Z8774 Personal history of (corrected) congenital malformations of heart and circulatory system: Secondary | ICD-10-CM | POA: Diagnosis not present

## 2020-03-31 DIAGNOSIS — Z7901 Long term (current) use of anticoagulants: Secondary | ICD-10-CM | POA: Diagnosis not present

## 2020-04-13 ENCOUNTER — Telehealth: Payer: Self-pay | Admitting: Internal Medicine

## 2020-04-13 NOTE — Telephone Encounter (Signed)
Patient due 05/18/20 for Prolia injection PA filed.   [name] Key: [id number] - PA Case ID: [id number]

## 2020-05-02 NOTE — Telephone Encounter (Signed)
Prolia injection scheduled at MD appointment on 06/03/20

## 2020-05-09 ENCOUNTER — Telehealth: Payer: Self-pay | Admitting: Internal Medicine

## 2020-05-09 MED ORDER — CARVEDILOL 3.125 MG PO TABS: ORAL_TABLET | ORAL | 1 refills | Status: DC

## 2020-05-09 NOTE — Telephone Encounter (Signed)
Patient needs a refill on her carvedilol (COREG) 3.125 MG tablet

## 2020-05-09 NOTE — Telephone Encounter (Signed)
Medication has been refilled.

## 2020-05-20 DIAGNOSIS — Q231 Congenital insufficiency of aortic valve: Secondary | ICD-10-CM | POA: Diagnosis not present

## 2020-05-20 DIAGNOSIS — Z952 Presence of prosthetic heart valve: Secondary | ICD-10-CM | POA: Diagnosis not present

## 2020-05-20 DIAGNOSIS — Z7901 Long term (current) use of anticoagulants: Secondary | ICD-10-CM | POA: Diagnosis not present

## 2020-05-23 DIAGNOSIS — Z7901 Long term (current) use of anticoagulants: Secondary | ICD-10-CM | POA: Diagnosis not present

## 2020-05-23 DIAGNOSIS — Z8774 Personal history of (corrected) congenital malformations of heart and circulatory system: Secondary | ICD-10-CM | POA: Diagnosis not present

## 2020-06-01 DIAGNOSIS — H903 Sensorineural hearing loss, bilateral: Secondary | ICD-10-CM | POA: Diagnosis not present

## 2020-06-01 DIAGNOSIS — H9011 Conductive hearing loss, unilateral, right ear, with unrestricted hearing on the contralateral side: Secondary | ICD-10-CM | POA: Diagnosis not present

## 2020-06-01 DIAGNOSIS — H9313 Tinnitus, bilateral: Secondary | ICD-10-CM | POA: Diagnosis not present

## 2020-06-03 ENCOUNTER — Ambulatory Visit: Payer: Medicare HMO | Admitting: Internal Medicine

## 2020-06-06 ENCOUNTER — Encounter: Payer: Self-pay | Admitting: Nurse Practitioner

## 2020-06-06 ENCOUNTER — Telehealth (INDEPENDENT_AMBULATORY_CARE_PROVIDER_SITE_OTHER): Payer: Medicare HMO | Admitting: Nurse Practitioner

## 2020-06-06 VITALS — Temp 97.6°F | Ht 66.0 in | Wt 313.0 lb

## 2020-06-06 DIAGNOSIS — B349 Viral infection, unspecified: Secondary | ICD-10-CM | POA: Diagnosis not present

## 2020-06-06 DIAGNOSIS — R0981 Nasal congestion: Secondary | ICD-10-CM | POA: Diagnosis not present

## 2020-06-06 DIAGNOSIS — Z8679 Personal history of other diseases of the circulatory system: Secondary | ICD-10-CM | POA: Diagnosis not present

## 2020-06-06 DIAGNOSIS — R0602 Shortness of breath: Secondary | ICD-10-CM

## 2020-06-06 DIAGNOSIS — R059 Cough, unspecified: Secondary | ICD-10-CM | POA: Diagnosis not present

## 2020-06-06 DIAGNOSIS — Z20822 Contact with and (suspected) exposure to covid-19: Secondary | ICD-10-CM

## 2020-06-06 DIAGNOSIS — J069 Acute upper respiratory infection, unspecified: Secondary | ICD-10-CM

## 2020-06-06 DIAGNOSIS — J22 Unspecified acute lower respiratory infection: Secondary | ICD-10-CM | POA: Diagnosis not present

## 2020-06-06 NOTE — Patient Instructions (Addendum)
Please go immediatly to a local Walk in Clinic in [place], [place] to get an in-person evaluation for your cough and congestion.  Recommend Covid test as well.  Please call the office with a symptom update.

## 2020-06-06 NOTE — Progress Notes (Signed)
Virtual Visit via Virtual Note  This visit type was conducted due to national recommendations for restrictions regarding the COVID-19 pandemic (e.g. social distancing).  This format is felt to be most appropriate for this patient at this time.  All issues noted in this document were discussed and addressed.  No physical exam was performed (except for noted visual exam findings with Video Visits).   I connected with@ on 06/06/20 at  3:30 PM EST by a video enabled telemedicine application or telephone and verified that I am speaking with the correct person using two identifiers. Location patient: home Location provider: work or home office Persons participating in the virtual visit: patient, provider  I discussed the limitations, risks, security and privacy concerns of performing an evaluation and management service by telephone and the availability of in person appointments. I also discussed with the patient that there may be a patient responsible charge related to this service. The patient expressed understanding and agreed to proceed.  Reason for visit: Sinus issues and cough with SOB  HPI: This 74 year old patient with complex medical history to include CHF, OSA, GERD, morbid obesity, history of joint replacement, vocal cord dysfunction, reports onset that this morning at 3:00 of waking up with shortness of breath.  She could not breathe.  Patient was coughing up large amounts of phlegm in her throat.  She continues to have sore throat, fatigue, cough, nasal congestion.  She has mild shortness of breath walking through the house.  She does use the albuterol inhaler twice and it has helped some.  She is bringing up some thick yellow phlegm.  She has noted no wheezing but does have some chest tightness.  No anterior chest pain.  No fevers or chills.  T-max is 97 range.  She has been treating this with vitamin C, raw honey, elderberry syrup, Alka-Seltzer plus.  She took tramadol leftover for sleep.   Patient has not had a Covid vaccine.  She has been visiting with grandkids who all have colds.  She did get the Covid vaccines in January February [place].  She resides in [facility] and is calling from there today.   ROS: See pertinent positives and negatives per HPI.  Past Medical History:  Diagnosis Date  . Anemia   . Aortic aneurysm (HCC) 09/22/2013  . Aortic stenosis    Due to bicuspid aortic valve. 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 Cervicalgia   . CHF (congestive heart failure) (HCC)   . GERD (gastroesophageal reflux disease)   . Headache(784.0)   . Hx of glaucoma   . Hyperlipidemia   . Hypertension   . Mitral valve disorder   . Morbid obesity (HCC)   . Obstructive sleep apnea on CPAP   . Thyroid disease    hypothyrodism    Past Surgical History:  Procedure Laterality Date  . 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 bilateral arthroscopic knee surgery  2001 & 2002  . CARDIAC CATHETERIZATION  2004   [facility]  . CARDIAC CATHETERIZATION  2008   [facility]  . CHOLECYSTECTOMY    . gastric banding surgery     @ [place] 04-16-96  . total left hip replacement  01-20-2007  . unilateral total knee replacement  08/2002    Family History  Problem Relation Age of Onset  . Coronary artery disease  Mother   . Heart disease Mother 4  . COPD Mother   . Hyperlipidemia Mother   . Cancer Father        testicular  . Pulmonary embolism Father   . Obesity Sister        and knee problems  . Cancer Sister        breast  . Cancer Brother        throat    SOCIAL HX: Non-smoker.   Current Outpatient Medications:  .  albuterol (PROVENTIL HFA;VENTOLIN HFA) 108 (90 Base) MCG/ACT inhaler, Inhale 3 puffs into the lungs 4 (four) times daily., Disp: 6.7 g, Rfl: 0 .  albuterol (PROVENTIL) (2.5 MG/3ML) 0.083% nebulizer solution, Take 3 mLs  (2.5 mg total) by nebulization every 4 (four) hours as needed for wheezing or shortness of breath., Disp: 150 mL, Rfl: 5 .  carvedilol (COREG) 3.125 MG tablet, TAKE 1 TABLET(3.125 MG) BY MOUTH EVERY 12 HOURS, Disp: 180 tablet, Rfl: 1 .  cetirizine (ZYRTEC) 10 MG tablet, Take 1 tablet (10 mg total) by mouth daily., Disp: 30 tablet, Rfl: 0 .  citalopram (CELEXA) 40 MG tablet, TAKE 1 TABLET EVERY DAY, Disp: 90 tablet, Rfl: 1 .  levothyroxine (SYNTHROID) 150 MCG tablet, Take 1 tablet (150 mcg total) by mouth daily., Disp: 90 tablet, Rfl: 3 .  lisinopril (PRINIVIL,ZESTRIL) 5 MG tablet, Take 5 mg by mouth daily. , Disp: , Rfl:  .  torsemide (DEMADEX) 20 MG tablet, Take 20 mg by mouth daily., Disp: , Rfl:  .  traMADol (ULTRAM) 50 MG tablet, Take 2 tablets (100 mg total) by mouth every 6 (six) hours as needed., Disp: 240 tablet, Rfl: 0 .  Vitamin D, Ergocalciferol, (DRISDOL) 1.25 MG (50000 UNIT) CAPS capsule, TAKE 1 CAPSULE EVERY 7 DAYS, Disp: 12 capsule, Rfl: 0 .  warfarin (COUMADIN) 10 MG tablet, Take 1 tablet (10 mg total) by mouth See admin instructions. (Patient taking differently: Take 10 mg by mouth See admin instructions. Takes three days a week), Disp: 60 tablet, Rfl: 5 .  warfarin (COUMADIN) 5 MG tablet, Take 5 mg by mouth daily. Takes 4 days a week, Disp: , Rfl:   EXAM:  VITALS per patient if applicable: 97.6, weight 313 pounds  GENERAL: alert, oriented, appears mildly and in no acute distress  HEENT: atraumatic, conjunctiva clear, no obvious abnormalities on inspection of external nose and ears She has chronic hoarseness.  Positive nasal congested tone to the voice. NECK: normal movements of the head and neck  LUNGS: on inspection no signs of respiratory distress, she has breathiness with speaking.  Able to complete full sentences.  No audible wheezing.  Breathing rate appears normal.  Coughing noted throughout the interview.  CV: no obvious cyanosis  MS: moves all visible extremities  without noticeable abnormality  PSYCH/NEURO: pleasant and cooperative, no obvious depression or anxiety, speech and thought processing grossly intact  ASSESSMENT AND PLAN:  Discussed the following assessment and plan:  URI with cough and congestion  Suspected COVID-19 virus infection  History of CHF (congestive heart failure)  SOB (shortness of breath)  No problem-specific Assessment & Plan notes found for this encounter.  Patient is ill, has risk factors for more severe disease and she has some breathiness with speaking and reports shortness of breath.  And she needs to have an in person evaluation and patient agrees.    She was advised: Please go immediately to a local Walk in Clinic in [place], [place] to get an in-person evaluation  for your cough and congestion.  Recommend Covid test as well.  Please call the office with a symptom update.  I discussed the assessment and treatment plan with the patient. The patient was provided an opportunity to ask questions and all were answered. The patient agreed with the plan and demonstrated an understanding of the instructions.   Amedeo Kinsman, NP Adult Nurse Practitioner [facility] [employer] [phone]

## 2020-06-07 ENCOUNTER — Encounter: Payer: Self-pay | Admitting: Nurse Practitioner

## 2020-06-09 DIAGNOSIS — R062 Wheezing: Secondary | ICD-10-CM | POA: Diagnosis not present

## 2020-06-09 DIAGNOSIS — Z6841 Body Mass Index (BMI) 40.0 and over, adult: Secondary | ICD-10-CM | POA: Diagnosis not present

## 2020-06-09 DIAGNOSIS — I10 Essential (primary) hypertension: Secondary | ICD-10-CM | POA: Diagnosis not present

## 2020-06-09 DIAGNOSIS — J22 Unspecified acute lower respiratory infection: Secondary | ICD-10-CM | POA: Diagnosis not present

## 2020-06-09 DIAGNOSIS — R0789 Other chest pain: Secondary | ICD-10-CM | POA: Diagnosis not present

## 2020-06-09 DIAGNOSIS — I509 Heart failure, unspecified: Secondary | ICD-10-CM | POA: Diagnosis not present

## 2020-06-14 ENCOUNTER — Ambulatory Visit: Payer: Medicare HMO | Admitting: Internal Medicine

## 2020-06-15 DIAGNOSIS — J449 Chronic obstructive pulmonary disease, unspecified: Secondary | ICD-10-CM | POA: Diagnosis not present

## 2020-06-15 DIAGNOSIS — I509 Heart failure, unspecified: Secondary | ICD-10-CM | POA: Diagnosis not present

## 2020-06-15 DIAGNOSIS — R062 Wheezing: Secondary | ICD-10-CM | POA: Diagnosis not present

## 2020-06-15 DIAGNOSIS — J22 Unspecified acute lower respiratory infection: Secondary | ICD-10-CM | POA: Diagnosis not present

## 2020-06-15 DIAGNOSIS — I10 Essential (primary) hypertension: Secondary | ICD-10-CM | POA: Diagnosis not present

## 2020-06-15 DIAGNOSIS — R093 Abnormal sputum: Secondary | ICD-10-CM | POA: Diagnosis not present

## 2020-06-17 ENCOUNTER — Other Ambulatory Visit: Payer: Self-pay | Admitting: Internal Medicine

## 2020-06-23 DIAGNOSIS — M19071 Primary osteoarthritis, right ankle and foot: Secondary | ICD-10-CM | POA: Diagnosis not present

## 2020-06-24 ENCOUNTER — Telehealth: Payer: Medicare HMO | Admitting: Internal Medicine

## 2020-06-24 ENCOUNTER — Other Ambulatory Visit: Payer: Self-pay

## 2020-06-24 DIAGNOSIS — Z7901 Long term (current) use of anticoagulants: Secondary | ICD-10-CM | POA: Diagnosis not present

## 2020-06-24 DIAGNOSIS — Z8774 Personal history of (corrected) congenital malformations of heart and circulatory system: Secondary | ICD-10-CM | POA: Diagnosis not present

## 2020-06-30 ENCOUNTER — Ambulatory Visit: Payer: Medicare HMO

## 2020-07-01 ENCOUNTER — Telehealth: Payer: Self-pay | Admitting: Internal Medicine

## 2020-07-01 DIAGNOSIS — R350 Frequency of micturition: Secondary | ICD-10-CM | POA: Diagnosis not present

## 2020-07-01 DIAGNOSIS — M549 Dorsalgia, unspecified: Secondary | ICD-10-CM | POA: Diagnosis not present

## 2020-07-01 DIAGNOSIS — R309 Painful micturition, unspecified: Secondary | ICD-10-CM | POA: Diagnosis not present

## 2020-07-01 DIAGNOSIS — R3915 Urgency of urination: Secondary | ICD-10-CM | POA: Diagnosis not present

## 2020-07-01 DIAGNOSIS — N10 Acute tubulo-interstitial nephritis: Secondary | ICD-10-CM | POA: Diagnosis not present

## 2020-07-01 NOTE — Telephone Encounter (Signed)
Patient called in stated that she thinks she has a UTI her kidney are hurting

## 2020-07-01 NOTE — Telephone Encounter (Signed)
Pt was advised that she will need to be evaluated at [facility]. Pt gave a verbal understanding and stated that she is going to go today.

## 2020-07-02 ENCOUNTER — Other Ambulatory Visit: Payer: Self-pay | Admitting: Internal Medicine

## 2020-07-12 ENCOUNTER — Encounter: Payer: Self-pay | Admitting: Internal Medicine

## 2020-07-12 ENCOUNTER — Telehealth (INDEPENDENT_AMBULATORY_CARE_PROVIDER_SITE_OTHER): Payer: Medicare HMO | Admitting: Internal Medicine

## 2020-07-12 VITALS — BP 127/32 | Ht 66.0 in | Wt 315.0 lb

## 2020-07-12 DIAGNOSIS — F331 Major depressive disorder, recurrent, moderate: Secondary | ICD-10-CM

## 2020-07-12 DIAGNOSIS — N3946 Mixed incontinence: Secondary | ICD-10-CM | POA: Diagnosis not present

## 2020-07-12 DIAGNOSIS — I5032 Chronic diastolic (congestive) heart failure: Secondary | ICD-10-CM | POA: Diagnosis not present

## 2020-07-12 DIAGNOSIS — Z7901 Long term (current) use of anticoagulants: Secondary | ICD-10-CM

## 2020-07-12 DIAGNOSIS — R152 Fecal urgency: Secondary | ICD-10-CM | POA: Diagnosis not present

## 2020-07-12 DIAGNOSIS — D6869 Other thrombophilia: Secondary | ICD-10-CM

## 2020-07-12 DIAGNOSIS — R159 Full incontinence of feces: Secondary | ICD-10-CM | POA: Diagnosis not present

## 2020-07-12 MED ORDER — TRAMADOL HCL 50 MG PO TABS: 50.0000 mg | ORAL_TABLET | Freq: Two times a day (BID) | ORAL | 5 refills | Status: DC

## 2020-07-12 NOTE — Assessment & Plan Note (Signed)
Intermittent/episodic,  Recent episode occurred in the setting of multiple rounds of antibiotics but has resolved spontaneously

## 2020-07-12 NOTE — Assessment & Plan Note (Signed)
She has been unable to effect a weight change due to limited tolerance for exercise.  Recommended the Optavia diet

## 2020-07-12 NOTE — Progress Notes (Signed)
Virtual Visit converted to Telephone  Note  This visit type was conducted due to national recommendations for restrictions regarding the COVID-19 pandemic (e.g. social distancing).  This format is felt to be most appropriate for this patient at this time.  All issues noted in this document were discussed and addressed.  No physical exam was performed (except for noted visual exam findings with Video Visits).   I attempted to connect with@ on 07/12/20 at  4:30 PM EST by a video enabled telemedicine application and verified that I am speaking with the correct person using two identifiers. Location patient: home Location provider: work or home office Persons participating in the virtual visit: patient, provider  I discussed the limitations, risks, security and privacy concerns of performing an evaluation and management service by telephone and the availability of in person appointments. I also discussed with the patient that there may be a patient responsible charge related to this service. The patient expressed understanding and agreed to proceed.  Interactive audio and video telecommunications were attempted between this provider and patient, however failed, due to patient having technical difficulties .  We continued and completed visit with audio only.  Reason for visit: follow up on depression,  Chronic pain and morbid obesity with OSA   HPI:  74 yr old female with aortic stenosis s/p AVR , morbid obesity with treated OSA,  OA and chronic pain, , DJD s/p bilateral TKR presents for follow up on multiple issues.  She was recently treated for bronchitis requiring several rounds of antibiotics /prednisone and nebulized atrovent/albuterol,  Illness  lasted 4 weeks .COVID NEGATIVE, both rapid and PCR  .   Following resolution,  Developed severe stabbing back pain , went to a walk in clinic to rule out kidney stone,  Given Cefdinir for UTI but did not tolerate it ,  Changed to Septra  Which has caused  diarrhea and fecal incontinence (3 episodes) which has resolved once she  Completed  the Septra DS.  The culture grew bacteria , .  Taking a probiotic every morning   Mood became more depressed but she states that she is  improving with resolution of illness   Since the episode of AAD she has been having daily episodes of fecal urgency that occurs after the first meal of the day ; consistency is soft /solid. Only has one stool per day   urge incontinence: chronic,  wearing two pads.  No dermatitis .   Going to resume myrbetriq . Refill needed    Morbid obesity:  Has not been able to lose more than 25 lbs and has regained all of it. Discussed the optavia diet   Chronic pain : lower back, knees.   using tramadol 1-2 tablets daily, usually at night to help her rest    ROS: See pertinent positives and negatives per HPI.  Past Medical History:  Diagnosis Date  . Anemia   . Aortic aneurysm (HCC) 09/22/2013  . Aortic stenosis    Due to bicuspid aortic valve. 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 at [place]  . 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 Cervicalgia   . CHF (congestive heart failure) (HCC)   . GERD (gastroesophageal reflux disease)   . Headache(784.0)   . Hx of glaucoma   . Hyperlipidemia   . Hypertension   . Mitral valve disorder   . Morbid obesity (HCC)   . Obstructive sleep apnea on  CPAP   . Thyroid disease    hypothyrodism    Past Surgical History:  Procedure Laterality Date  . 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 bilateral arthroscopic knee surgery  2001 & 2002  . CARDIAC CATHETERIZATION  2004   [facility]  . CARDIAC CATHETERIZATION  2008   [facility]  . CHOLECYSTECTOMY    . gastric banding surgery     @ [place] 04-16-96  . total left hip replacement  01-20-2007  . unilateral total knee replacement  08/2002    Family History  Problem Relation Age of Onset  . Coronary artery  disease Mother   . Heart disease Mother 79  . COPD Mother   . Hyperlipidemia Mother   . Cancer Father        testicular  . Pulmonary embolism Father   . Obesity Sister        and knee problems  . Cancer Sister        breast  . Cancer Brother        throat    SOCIAL HX:  reports that she has never smoked. She has never used smokeless tobacco. She reports current alcohol use of about 1.0 - 2.0 standard drink of alcohol per week. She reports that she does not use drugs.  She lives alone.    Current Outpatient Medications:  .  albuterol (PROVENTIL HFA;VENTOLIN HFA) 108 (90 Base) MCG/ACT inhaler, Inhale 3 puffs into the lungs 4 (four) times daily., Disp: 6.7 g, Rfl: 0 .  albuterol (PROVENTIL) (2.5 MG/3ML) 0.083% nebulizer solution, Take 3 mLs (2.5 mg total) by nebulization every 4 (four) hours as needed for wheezing or shortness of breath., Disp: 150 mL, Rfl: 5 .  carvedilol (COREG) 3.125 MG tablet, TAKE 1 TABLET(3.125 MG) BY MOUTH EVERY 12 HOURS, Disp: 180 tablet, Rfl: 1 .  cetirizine (ZYRTEC) 10 MG tablet, Take 1 tablet (10 mg total) by mouth daily., Disp: 30 tablet, Rfl: 0 .  citalopram (CELEXA) 40 MG tablet, TAKE 1 TABLET EVERY DAY, Disp: 90 tablet, Rfl: 1 .  levothyroxine (SYNTHROID) 150 MCG tablet, TAKE 1 TABLET(150 MCG) BY MOUTH DAILY, Disp: 90 tablet, Rfl: 3 .  lisinopril (PRINIVIL,ZESTRIL) 5 MG tablet, Take 5 mg by mouth daily. , Disp: , Rfl:  .  torsemide (DEMADEX) 20 MG tablet, Take 20 mg by mouth daily., Disp: , Rfl:  .  Vitamin D, Ergocalciferol, (DRISDOL) 1.25 MG (50000 UNIT) CAPS capsule, TAKE 1 CAPSULE EVERY 7 DAYS, Disp: 12 capsule, Rfl: 0 .  warfarin (COUMADIN) 10 MG tablet, Take 1 tablet (10 mg total) by mouth See admin instructions. (Patient taking differently: Take 10 mg by mouth See admin instructions. Takes three days a week), Disp: 60 tablet, Rfl: 5 .  warfarin (COUMADIN) 5 MG tablet, Take 5 mg by mouth daily. Takes 4 days a week, Disp: , Rfl:  .  mirabegron ER  (MYRBETRIQ) 50 MG TB24 tablet, Take 1 tablet (50 mg total) by mouth daily., Disp: 90 tablet, Rfl: 3 .  traMADol (ULTRAM) 50 MG tablet, Take 1 tablet (50 mg total) by mouth 2 (two) times daily., Disp: 60 tablet, Rfl: 5  EXAM:  General impression: alert, cooperative and articulate.  No signs of being in distress  Lungs: speech is fluent sentence length suggests that patient is not short of breath and not punctuated by cough, sneezing or sniffing. Marland Kitchen   Psych: affect normal.  speech is articulate and non pressured .  Denies suicidal  thoughts    ASSESSMENT AND PLAN:  Discussed the following assessment and plan:  Long term (current) use of anticoagulants - Plan: CBC with Differential/Platelet  Morbid obesity (HCC) - Plan: Comprehensive metabolic panel, Lipid panel  Incontinence of feces with fecal urgency  Acquired thrombophilia (HCC)  Mixed incontinence urge and stress  Moderate episode of recurrent major depressive disorder (HCC)  Chronic diastolic heart failure (HCC)  Morbid obesity (HCC) She has been unable to effect a weight change due to limited tolerance for exercise.  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  She requires lifelong warfarin,  INR is managed by cardiology  Lab Results  Component Value Date   WBC 7.3 09/10/2019   HGB 12.8 09/10/2019   HCT 40.3 09/10/2019   MCV 93 09/10/2019   PLT 235 09/10/2019     Mixed incontinence urge and stress Resuming Myrbetriq for chronic mixed stress/urge incontinence.  Refills sent   Major depressive disorder, recurrent episode (HCC) She denies any symptoms of persistent depression and is tolerating citalopram without any side effects.  She is sleeping well, has a good appetite and having social  outings once a week.  She has taken a healthy interest in her appearance  and her demeanor is calm.  No changes today   Heart failure, unspecified (HCC) Diastolic dysfunction.  EF normal by March 2021 ECHO.Marland Kitchen  Continue carvedilol, torsemide     I discussed the assessment and treatment plan with the patient. The patient was provided an opportunity to ask questions and all were answered. The patient agreed with the plan and demonstrated an understanding of the instructions.   The patient was advised to call back or seek an in-person evaluation if the symptoms worsen or if the condition fails to improve as anticipated.  I provided 30 minutes of non-face-to-face time during this encounter.   Maria Shams, MD

## 2020-07-13 ENCOUNTER — Other Ambulatory Visit: Payer: Self-pay | Admitting: Internal Medicine

## 2020-07-14 ENCOUNTER — Telehealth: Payer: Self-pay | Admitting: Internal Medicine

## 2020-07-14 MED ORDER — MIRABEGRON ER 50 MG PO TB24: 50.0000 mg | ORAL_TABLET | Freq: Every day | ORAL | 3 refills | Status: DC

## 2020-07-14 NOTE — Telephone Encounter (Signed)
Patient saw Dr. Darrick Huntsman on Tuesday and Dr. Darrick Huntsman was suppose to fill her Mybetrique. Patient went to pick up at pharmacy and it was not filled.

## 2020-07-14 NOTE — Telephone Encounter (Signed)
Refill sent.

## 2020-07-14 NOTE — Telephone Encounter (Signed)
Is this okay to refill? 

## 2020-07-15 NOTE — Assessment & Plan Note (Addendum)
She denies any symptoms of persistent depression and is tolerating citalopram without any side effects.  She is sleeping well, has a good appetite and having social outings once a week.  She has taken a healthy interest in her appearance  and her demeanor is calm.  No changes today

## 2020-07-15 NOTE — Assessment & Plan Note (Signed)
Secondary to aortic valve replacement.  She requires lifelong warfarin,  INR is managed by cardiology  Lab Results  Component Value Date   WBC 7.3 09/10/2019   HGB 12.8 09/10/2019   HCT 40.3 09/10/2019   MCV 93 09/10/2019   PLT 235 09/10/2019

## 2020-07-15 NOTE — Assessment & Plan Note (Signed)
Resuming Myrbetriq for chronic mixed stress/urge incontinence.  Refills sent

## 2020-07-15 NOTE — Assessment & Plan Note (Signed)
Diastolic dysfunction.  EF normal by March 2021 ECHO.Marland Kitchen  Continue carvedilol, torsemide

## 2020-09-22 DIAGNOSIS — Z8774 Personal history of (corrected) congenital malformations of heart and circulatory system: Secondary | ICD-10-CM | POA: Diagnosis not present

## 2020-09-22 DIAGNOSIS — Z7901 Long term (current) use of anticoagulants: Secondary | ICD-10-CM | POA: Diagnosis not present

## 2020-10-10 DIAGNOSIS — M19072 Primary osteoarthritis, left ankle and foot: Secondary | ICD-10-CM | POA: Diagnosis not present

## 2020-10-10 DIAGNOSIS — M2011 Hallux valgus (acquired), right foot: Secondary | ICD-10-CM | POA: Diagnosis not present

## 2020-10-10 DIAGNOSIS — M19071 Primary osteoarthritis, right ankle and foot: Secondary | ICD-10-CM | POA: Diagnosis not present

## 2020-10-10 DIAGNOSIS — M79671 Pain in right foot: Secondary | ICD-10-CM | POA: Diagnosis not present

## 2020-11-09 DIAGNOSIS — M1009 Idiopathic gout, multiple sites: Secondary | ICD-10-CM | POA: Diagnosis not present

## 2020-11-09 DIAGNOSIS — M79671 Pain in right foot: Secondary | ICD-10-CM | POA: Diagnosis not present

## 2020-11-09 DIAGNOSIS — M2011 Hallux valgus (acquired), right foot: Secondary | ICD-10-CM | POA: Diagnosis not present

## 2020-11-30 ENCOUNTER — Encounter: Payer: Self-pay | Admitting: Internal Medicine

## 2020-11-30 ENCOUNTER — Telehealth (INDEPENDENT_AMBULATORY_CARE_PROVIDER_SITE_OTHER): Payer: Medicare PPO | Admitting: Internal Medicine

## 2020-11-30 VITALS — BP 124/60 | Ht 66.0 in | Wt 302.0 lb

## 2020-11-30 DIAGNOSIS — E782 Mixed hyperlipidemia: Secondary | ICD-10-CM | POA: Diagnosis not present

## 2020-11-30 DIAGNOSIS — D6869 Other thrombophilia: Secondary | ICD-10-CM | POA: Diagnosis not present

## 2020-11-30 DIAGNOSIS — Z01818 Encounter for other preprocedural examination: Secondary | ICD-10-CM | POA: Diagnosis not present

## 2020-11-30 DIAGNOSIS — I5032 Chronic diastolic (congestive) heart failure: Secondary | ICD-10-CM

## 2020-11-30 DIAGNOSIS — F331 Major depressive disorder, recurrent, moderate: Secondary | ICD-10-CM

## 2020-11-30 MED ORDER — CARVEDILOL 3.125 MG PO TABS: ORAL_TABLET | ORAL | 1 refills | Status: AC

## 2020-11-30 NOTE — Progress Notes (Signed)
. Virtual Visit via Caregility  This visit type was conducted due to national recommendations for restrictions regarding the COVID-19 pandemic (e.g. social distancing).  This format is felt to be most appropriate for this patient at this time.  All issues noted in this document were discussed and addressed.  No physical exam was performed (except for noted visual exam findings with Video Visits).   I connected with@ on 11/30/20 at  4:30 PM EDT by a video enabled telemedicine application  and verified that I am speaking with the correct person using two identifiers. Location patient: home Location provider: work or home office Persons participating in the virtual visit: patient, provider  I discussed the limitations, risks, security and privacy concerns of performing an evaluation and management service by telephone and the availability of in person appointments. I also discussed with the patient that there may be a patient responsible charge related to this service. The patient expressed understanding and agreed to proceed.   Reason for visit: follow up , worsening depression,  Preoperative evaluation   HPI:  75 yr old female with complicated medical history including congenital bicuspid aortic valve with aortic stenosis s/p aortic valve replacement , morbid obesity with OSA.  Chronic diastolic dysfunction,  Depression and gouty arthropathy presents for follow up on chronic conditions and preoperative evaluation   She has chronic right foot pain secondary to acquire hallux valgus which is limiting her mobility and has decided to have corrective surgery.  She has not seen her cardiologist at [facility] in over a year but continues to have her warfarin dose regulated by their office with INRs averaging every 2 to 3 months (last one in March).  Last ECHO       ROS: See pertinent positives and negatives per HPI.  Past Medical History:  Diagnosis Date  . Anemia   . Aortic aneurysm (HCC) 09/22/2013   . Aortic stenosis    Due to bicuspid aortic valve. 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 Cervicalgia   . CHF (congestive heart failure) (HCC)   . GERD (gastroesophageal reflux disease)   . Headache(784.0)   . Hx of glaucoma   . Hyperlipidemia   . Hypertension   . Mitral valve disorder   . Morbid obesity (HCC)   . Obstructive sleep apnea on CPAP   . Thyroid disease    hypothyrodism    Past Surgical History:  Procedure Laterality Date  . 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 bilateral arthroscopic knee surgery  2001 & 2002  . CARDIAC CATHETERIZATION  2004   [facility]  . CARDIAC CATHETERIZATION  2008   [facility]  . CHOLECYSTECTOMY    . gastric banding surgery     @ [place] 04-16-96  . total left hip replacement  01-20-2007  . unilateral total knee replacement  08/2002    Family History  Problem Relation Age of Onset  . Coronary artery disease Mother   . Heart disease Mother 22  . COPD Mother   . Hyperlipidemia Mother   . Cancer Father        testicular  . Pulmonary embolism Father   . Obesity Sister        and knee problems  . Cancer Sister        breast  . Cancer Brother  throat    SOCIAL HX:  reports that she has never smoked. She has never used smokeless tobacco. She reports current alcohol use of about 1.0 - 2.0 standard drink of alcohol per week. She reports that she does not use drugs.  Current Outpatient Medications:  .  albuterol (PROVENTIL HFA;VENTOLIN HFA) 108 (90 Base) MCG/ACT inhaler, Inhale 3 puffs into the lungs 4 (four) times daily., Disp: 6.7 g, Rfl: 0 .  albuterol (PROVENTIL) (2.5 MG/3ML) 0.083% nebulizer solution, Take 3 mLs (2.5 mg total) by nebulization every 4 (four) hours as needed for wheezing or shortness of breath., Disp: 150 mL, Rfl: 5 .  cetirizine (ZYRTEC) 10 MG tablet, Take 1 tablet (10 mg total)  by mouth daily., Disp: 30 tablet, Rfl: 0 .  citalopram (CELEXA) 40 MG tablet, TAKE 1 TABLET EVERY DAY, Disp: 90 tablet, Rfl: 1 .  colchicine 0.6 MG tablet, Take 0.6 mg by mouth daily., Disp: , Rfl:  .  levothyroxine (SYNTHROID) 150 MCG tablet, TAKE 1 TABLET(150 MCG) BY MOUTH DAILY, Disp: 90 tablet, Rfl: 3 .  lisinopril (PRINIVIL,ZESTRIL) 5 MG tablet, Take 5 mg by mouth daily. , Disp: , Rfl:  .  mirabegron ER (MYRBETRIQ) 50 MG TB24 tablet, Take 1 tablet (50 mg total) by mouth daily., Disp: 90 tablet, Rfl: 3 .  torsemide (DEMADEX) 20 MG tablet, Take 20 mg by mouth daily., Disp: , Rfl:  .  traMADol (ULTRAM) 50 MG tablet, Take 1 tablet (50 mg total) by mouth 2 (two) times daily., Disp: 60 tablet, Rfl: 5 .  Vitamin D, Ergocalciferol, (DRISDOL) 1.25 MG (50000 UNIT) CAPS capsule, TAKE 1 CAPSULE EVERY 7 DAYS, Disp: 12 capsule, Rfl: 0 .  warfarin (COUMADIN) 10 MG tablet, Take 1 tablet (10 mg total) by mouth See admin instructions. (Patient taking differently: Take 10 mg by mouth See admin instructions. Takes three days a week), Disp: 60 tablet, Rfl: 5 .  warfarin (COUMADIN) 5 MG tablet, Take 5 mg by mouth daily. Takes 4 days a week, Disp: , Rfl:  .  carvedilol (COREG) 3.125 MG tablet, TAKE 1 TABLET(3.125 MG) BY MOUTH EVERY 12 HOURS, Disp: 180 tablet, Rfl: 1  EXAM:  VITALS per patient if applicable:  GENERAL: alert, oriented, appears well and in no acute distress  HEENT: atraumatic, conjunttiva clear, no obvious abnormalities on inspection of external nose and ears  NECK: normal movements of the head and neck  LUNGS: on inspection no signs of respiratory distress, breathing rate appears normal, no obvious gross SOB, gasping or wheezing  CV: no obvious cyanosis  MS: moves all visible extremities without noticeable abnormality  PSYCH/NEURO: pleasant and cooperative, no obvious depression or anxiety, speech and thought processing grossly intact  ASSESSMENT AND PLAN:  Discussed the following  assessment and plan:  Acquired thrombophilia (HCC) - Plan: CBC with Differential/Platelet  Moderate episode of recurrent major depressive disorder (HCC)  Preoperative examination - Plan: Urinalysis, Routine w reflex microscopic, Comprehensive metabolic panel  Moderate mixed hyperlipidemia not requiring statin therapy - Plan: Lipid panel, TSH  Chronic diastolic heart failure (HCC)  Preoperative evaluation to rule out surgical contraindication  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  She requires lifelong warfarin,  INR is managed by [facility] cardiology  Lab Results  Component Value Date   WBC 7.3 09/10/2019   HGB 12.8 09/10/2019   HCT 40.3 09/10/2019   MCV 93 09/10/2019   PLT 235 09/10/2019     Major depressive disorder, recurrent episode (HCC) She agrees that her depression  is worse,  Aggravated by her relative isolation from her family caused by her health problems and her relocation to [place].  her niece's decision to have her wedding in a locale [place] can't get to has really disappointed her. She is not suicidal and does not want any medication changes but is receptive to the idea of seeing a therapist for talk therapy but does not want a "life coach."    She will be given a list of potential providers.   Preoperative evaluation to rule out surgical contraindication She has no symptoms currently of uncompensated heart failure or CAD and will be scheduled for an in person evaluation and lab assessment .  She will require warfarin suspension and Lovenox bridging pre and peroperatively    I spent 30 mintutes dedicated to the care of this patient on the date of this encounter to include pre-visit review of his medical history,  Face-to-face time with the patient discussing her depression,  Her weight and her upcoming surgery  , and post visit ordering of testing and therapeutics.  I discussed the assessment and treatment plan with the patient. The  patient was provided an opportunity to ask questions and all were answered. The patient agreed with the plan and demonstrated an understanding of the instructions.   The patient was advised to call back or seek an in-person evaluation if the symptoms worsen or if the condition fails to improve as anticipated.   I spent 30 minutes dedicated to the care of this patient on the date of this encounter to include pre-visit review of his medical history,  Face-to-face time with the patient , and post visit ordering of testing and therapeutics.    Sherlene Shams, MD

## 2020-12-03 DIAGNOSIS — Z01818 Encounter for other preprocedural examination: Secondary | ICD-10-CM | POA: Insufficient documentation

## 2020-12-03 NOTE — Assessment & Plan Note (Addendum)
She agrees that her depression is worse,  Aggravated by her relative isolation from her family caused by her health problems and her relocation to [place].  her niece's decision to have her wedding in a locale [place] can't get to has really disappointed her. She is not suicidal and does not want any medication changes but is receptive to the idea of seeing a therapist for talk therapy but does not want a "life coach."    She will be given a list of potential providers.

## 2020-12-03 NOTE — Assessment & Plan Note (Signed)
Secondary to aortic valve replacement.  She requires lifelong warfarin,  INR is managed by [facility] cardiology  Lab Results  Component Value Date   WBC 7.3 09/10/2019   HGB 12.8 09/10/2019   HCT 40.3 09/10/2019   MCV 93 09/10/2019   PLT 235 09/10/2019

## 2020-12-03 NOTE — Assessment & Plan Note (Addendum)
She has no symptoms currently of uncompensated heart failure or CAD and will be scheduled for an in person evaluation and lab assessment .  She will require warfarin suspension and Lovenox bridging pre and peroperatively

## 2020-12-08 ENCOUNTER — Encounter: Payer: Self-pay | Admitting: Internal Medicine

## 2020-12-08 ENCOUNTER — Other Ambulatory Visit: Payer: Self-pay

## 2020-12-08 ENCOUNTER — Ambulatory Visit (INDEPENDENT_AMBULATORY_CARE_PROVIDER_SITE_OTHER): Payer: Medicare PPO | Admitting: Internal Medicine

## 2020-12-08 VITALS — BP 116/58 | HR 71 | Temp 98.2°F | Ht 65.98 in | Wt 315.8 lb

## 2020-12-08 DIAGNOSIS — M810 Age-related osteoporosis without current pathological fracture: Secondary | ICD-10-CM

## 2020-12-08 DIAGNOSIS — R0600 Dyspnea, unspecified: Secondary | ICD-10-CM

## 2020-12-08 DIAGNOSIS — I482 Chronic atrial fibrillation, unspecified: Secondary | ICD-10-CM

## 2020-12-08 DIAGNOSIS — Z7901 Long term (current) use of anticoagulants: Secondary | ICD-10-CM | POA: Diagnosis not present

## 2020-12-08 DIAGNOSIS — G4733 Obstructive sleep apnea (adult) (pediatric): Secondary | ICD-10-CM

## 2020-12-08 DIAGNOSIS — Z01818 Encounter for other preprocedural examination: Secondary | ICD-10-CM

## 2020-12-08 DIAGNOSIS — Z23 Encounter for immunization: Secondary | ICD-10-CM | POA: Diagnosis not present

## 2020-12-08 DIAGNOSIS — E559 Vitamin D deficiency, unspecified: Secondary | ICD-10-CM

## 2020-12-08 DIAGNOSIS — I4891 Unspecified atrial fibrillation: Secondary | ICD-10-CM | POA: Insufficient documentation

## 2020-12-08 DIAGNOSIS — Z5181 Encounter for therapeutic drug level monitoring: Secondary | ICD-10-CM | POA: Diagnosis not present

## 2020-12-08 DIAGNOSIS — F331 Major depressive disorder, recurrent, moderate: Secondary | ICD-10-CM | POA: Diagnosis not present

## 2020-12-08 DIAGNOSIS — Z8739 Personal history of other diseases of the musculoskeletal system and connective tissue: Secondary | ICD-10-CM | POA: Diagnosis not present

## 2020-12-08 DIAGNOSIS — R0609 Other forms of dyspnea: Secondary | ICD-10-CM

## 2020-12-08 LAB — CBC WITH DIFFERENTIAL/PLATELET
Basophils Absolute: 0.1 10*3/uL (ref 0.0–0.1)
Basophils Relative: 1.1 % (ref 0.0–3.0)
Eosinophils Absolute: 0.4 10*3/uL (ref 0.0–0.7)
Eosinophils Relative: 4.3 % (ref 0.0–5.0)
HCT: 39.5 % (ref 36.0–46.0)
Hemoglobin: 13.2 g/dL (ref 12.0–15.0)
Lymphocytes Relative: 28.5 % (ref 12.0–46.0)
Lymphs Abs: 2.4 10*3/uL (ref 0.7–4.0)
MCHC: 33.6 g/dL (ref 30.0–36.0)
MCV: 88.2 fl (ref 78.0–100.0)
Monocytes Absolute: 0.6 10*3/uL (ref 0.1–1.0)
Monocytes Relative: 7.3 % (ref 3.0–12.0)
Neutro Abs: 5 10*3/uL (ref 1.4–7.7)
Neutrophils Relative %: 58.8 % (ref 43.0–77.0)
Platelets: 221 10*3/uL (ref 150.0–400.0)
RBC: 4.47 Mil/uL (ref 3.87–5.11)
RDW: 14.6 % (ref 11.5–15.5)
WBC: 8.4 10*3/uL (ref 4.0–10.5)

## 2020-12-08 LAB — COMPREHENSIVE METABOLIC PANEL
ALT: 11 U/L (ref 0–35)
AST: 16 U/L (ref 0–37)
Albumin: 4.4 g/dL (ref 3.5–5.2)
Alkaline Phosphatase: 59 U/L (ref 39–117)
BUN: 32 mg/dL — ABNORMAL HIGH (ref 6–23)
CO2: 31 mEq/L (ref 19–32)
Calcium: 9.3 mg/dL (ref 8.4–10.5)
Chloride: 103 mEq/L (ref 96–112)
Creatinine, Ser: 1.57 mg/dL — ABNORMAL HIGH (ref 0.40–1.20)
GFR: 32.55 mL/min — ABNORMAL LOW (ref >60.00)
Glucose, Bld: 99 mg/dL (ref 70–99)
Potassium: 4.6 mEq/L (ref 3.5–5.1)
Sodium: 139 mEq/L (ref 135–145)
Total Bilirubin: 0.6 mg/dL (ref 0.2–1.2)
Total Protein: 7.4 g/dL (ref 6.0–8.3)

## 2020-12-08 LAB — LIPID PANEL
Cholesterol: 239 mg/dL — ABNORMAL HIGH (ref 0–200)
HDL: 79.7 mg/dL (ref >39.00)
LDL Cholesterol: 133 mg/dL — ABNORMAL HIGH (ref 0–99)
NonHDL: 159
Total CHOL/HDL Ratio: 3
Triglycerides: 128 mg/dL (ref 0.0–149.0)
VLDL: 25.6 mg/dL (ref 0.0–40.0)

## 2020-12-08 LAB — PROTIME-INR
INR: 1.7 ratio — ABNORMAL HIGH (ref 0.8–1.0)
Prothrombin Time: 19.2 s — ABNORMAL HIGH (ref 9.6–13.1)

## 2020-12-08 LAB — BRAIN NATRIURETIC PEPTIDE: Pro B Natriuretic peptide (BNP): 58 pg/mL (ref 0.0–100.0)

## 2020-12-08 LAB — TSH: TSH: 0.93 u[IU]/mL (ref 0.35–4.50)

## 2020-12-08 LAB — URIC ACID: Uric Acid, Serum: 9.5 mg/dL — ABNORMAL HIGH (ref 2.4–7.0)

## 2020-12-08 LAB — VITAMIN D 25 HYDROXY (VIT D DEFICIENCY, FRACTURES): VITD: 29.24 ng/mL — ABNORMAL LOW (ref 30.00–100.00)

## 2020-12-08 LAB — MAGNESIUM: Magnesium: 2.2 mg/dL (ref 1.5–2.5)

## 2020-12-08 MED ORDER — ARIPIPRAZOLE 2 MG PO TABS: 2.0000 mg | ORAL_TABLET | Freq: Every day | ORAL | 1 refills | Status: DC

## 2020-12-08 MED ORDER — DENOSUMAB 60 MG/ML ~~LOC~~ SOSY
60.0000 mg | PREFILLED_SYRINGE | Freq: Once | SUBCUTANEOUS | Status: AC
  Administered 2020-12-08: 60 mg via SUBCUTANEOUS

## 2020-12-08 NOTE — Assessment & Plan Note (Signed)
Untreated for over a year due to mask intolerance.  Not sure if she is a candidate for alternative therapy (INSPIRE).  Will need referral and repeat sleep study if this is pursued

## 2020-12-08 NOTE — Assessment & Plan Note (Addendum)
I have ordered and reviewed a 12 lead EKG and find that there are significant changesc compared to previous EKG available ;  She appears to be in atrial fibrillation  And has an irreg irreg rhythm on auscultation as well.  Her history is supportive;  She has been more short of breath with daily activities and lung exam is clear.  Chronicity unclear. . She is already anticoagulated due to sT Jude valve.  Taking carvedilolol. Thyroid, labs,  BNP ordered, and patient advised to follow up with her cardiologist a at [facility] ASAP for management.  Briefly discussed the various treatment options which include medication, cardioversion and ablation,  The choice of which depends largely on her tolerance level of atrial fib when rate controlled. Needs OSA addressed;;  Will review her request for INSPIRE procedure and refer if appropriate

## 2020-12-08 NOTE — Assessment & Plan Note (Signed)
Checking uric acid level today to determine if allopurinol l is needed .  Advised that daily colchicine may be the cause of her diarrhea and will be transitioned off .

## 2020-12-08 NOTE — Assessment & Plan Note (Addendum)
Adding 2 mg abilify to current regimen of d citalopra.  List of therapists given to patient

## 2020-12-08 NOTE — Assessment & Plan Note (Signed)
She is NOT cleared for surgery due to new onset atrial fibrillation,which needs to be addressed by her cardiologist.

## 2020-12-08 NOTE — Patient Instructions (Addendum)
You received the Prevnar 20 pneumonia vaccine today , along with your Prolia injection (done every 6 months for osteoporosis)  Your exam and EKG suggest you are in an abnormal rhythm called atrial fibrillation.  This may be caused by your untreated sleep apnea,  But it needs to be addressed by your cardiologist ASAP .    Atrial Fibrillation  Atrial fibrillation is a type of irregular or rapid heartbeat (arrhythmia). In atrial fibrillation, the top part of the heart (atria) beats in an irregular pattern. This makes the heart unable to pump blood normally and effectively. The goal of treatment is to prevent blood clots from forming, control your heart rate, or restore your heartbeat to a normal rhythm. If this condition is not treated, it can cause serious problems, such as a weakened heart muscle (cardiomyopathy) or a stroke. What are the causes? This condition is often caused by medical conditions that damage the heart's electrical system. These include:  High blood pressure (hypertension). This is the most common cause.  Certain heart problems or conditions, such as heart failure, coronary artery disease, heart valve problems, or heart surgery.  Diabetes.  Overactive thyroid (hyperthyroidism).  Obesity.  Chronic kidney disease. In some cases, the cause of this condition is not known. What increases the risk? This condition is more likely to develop in:  Older people.  People who smoke.  Athletes who do endurance exercise.  People who have a family history of atrial fibrillation.  Men.  People who use drugs.  People who drink a lot of alcohol.  People who have lung conditions, such as emphysema, pneumonia, or COPD.  People who have obstructive sleep apnea. What are the signs or symptoms? Symptoms of this condition include:  A feeling that your heart is racing or beating irregularly.  Discomfort or pain in your chest.  Shortness of breath.  Sudden light-headedness  or weakness.  Tiring easily during exercise or activity.  Fatigue.  Syncope (fainting).  Sweating. In some cases, there are no symptoms. How is this diagnosed? Your health care provider may detect atrial fibrillation when taking your pulse. If detected, this condition may be diagnosed with:  An electrocardiogram (ECG) to check electrical signals of the heart.  An ambulatory cardiac monitor to record your heart's activity for a few days.  A transthoracic echocardiogram (TTE) to create pictures of your heart.  A transesophageal echocardiogram (TEE) to create even closer pictures of your heart.  A stress test to check your blood supply while you exercise.  Imaging tests, such as a CT scan or chest X-ray.  Blood tests. How is this treated? Treatment depends on underlying conditions and how you feel when you experience atrial fibrillation. This condition may be treated with:  Medicines to prevent blood clots or to treat heart rate or heart rhythm problems.  Electrical cardioversion to reset the heart's rhythm.  A pacemaker to correct abnormal heart rhythm.  Ablation to remove the heart tissue that sends abnormal signals.  Left atrial appendage closure to seal the area where blood clots can form. In some cases, underlying conditions will be treated. Follow these instructions at home: Medicines  Take over-the counter and prescription medicines only as told by your health care provider.  Do not take any new medicines without talking to your health care provider.  If you are taking blood thinners: ? Talk with your health care provider before you take any medicines that contain aspirin or NSAIDs, such as ibuprofen. These medicines increase your risk  for dangerous bleeding. ? Take your medicine exactly as told, at the same time every day. ? Avoid activities that could cause injury or bruising, and follow instructions about how to prevent falls. ? Wear a medical alert bracelet  or carry a card that lists what medicines you take. Lifestyle  Do not use any products that contain nicotine or tobacco, such as cigarettes, e-cigarettes, and chewing tobacco. If you need help quitting, ask your health care provider.  Eat heart-healthy foods. Talk with a dietitian to make an eating plan that is right for you.  Exercise regularly as told by your health care provider.  Do not drink alcohol.  Lose weight if you are overweight.  Do not use drugs, including cannabis.      General instructions  If you have obstructive sleep apnea, manage your condition as told by your health care provider.  Do not use diet pills unless your health care provider approves. Diet pills can make heart problems worse.  Keep all follow-up visits as told by your health care provider. This is important. Contact a health care provider if you:  Notice a change in the rate, rhythm, or strength of your heartbeat.  Are taking a blood thinner and you notice more bruising.  Tire more easily when you exercise or do heavy work.  Have a sudden change in weight. Get help right away if you have:  Chest pain, abdominal pain, sweating, or weakness.  Trouble breathing.  Side effects of blood thinners, such as blood in your vomit, stool, or urine, or bleeding that cannot stop.  Any symptoms of a stroke. "BE FAST" is an easy way to remember the main warning signs of a stroke: ? B - Balance. Signs are dizziness, sudden trouble walking, or loss of balance. ? E - Eyes. Signs are trouble seeing or a sudden change in vision. ? F - Face. Signs are sudden weakness or numbness of the face, or the face or eyelid drooping on one side. ? A - Arms. Signs are weakness or numbness in an arm. This happens suddenly and usually on one side of the body. ? S - Speech. Signs are sudden trouble speaking, slurred speech, or trouble understanding what people say. ? T - Time. Time to call emergency services. Write down what  time symptoms started.  Other signs of a stroke, such as: ? A sudden, severe headache with no known cause. ? Nausea or vomiting. ? Seizure. These symptoms may represent a serious problem that is an emergency. Do not wait to see if the symptoms will go away. Get medical help right away. Call your local emergency services (911 in the [place] Do not drive yourself to the hospital.   Summary  Atrial fibrillation is a type of irregular or rapid heartbeat (arrhythmia).  Symptoms include a feeling that your heart is beating fast or irregularly.  You may be given medicines to prevent blood clots or to treat heart rate or heart rhythm problems.  Get help right away if you have signs or symptoms of a stroke.  Get help right away if you cannot catch your breath or have chest pain or pressure. This information is not intended to replace advice given to you by your health care provider. Make sure you discuss any questions you have with your health care provider. Document Revised: 12/31/2018 Document Reviewed: 12/31/2018 Elsevier Patient Education  2021 [employer].

## 2020-12-08 NOTE — Progress Notes (Signed)
Subjective:  Patient ID: Maria Torres, female    DOB: January 17, 1946  Age: 75 y.o. MRN: [id number]  CC: The primary encounter diagnosis was Pre-op evaluation. 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  HPI Maria Torres presents for preoperative evaluation.  This visit occurred during the SARS-CoV-2 public health emergency.  Safety protocols were in place, including screening questions prior to the visit, additional usage of staff PPE, and extensive cleaning of exam room while observing appropriate contact time as indicated for disinfecting solutions.   Maria Torres is a 75 yr old female with a history of bicuspid aortic valve and AS s/p St Jude Valve in 2009 and repair of aortic aneurysm in 2009 who is followed by [facility].  Her last in office evaluation by cardiology was March 2021, at which time  Her EF was normal by ECHO and she was reportedly in sinus rhythm by EKG   (her [facility] EKG Is not available for review)   She presents today for preoperative clearance for a planned surgery on June 21 on her right foot to repair a bunion and fuse the MTP joint   by  [facility] in [place].  She denies chest pain  But has been more short of breath lately with leisurely activty.  She has no known history of atrial fibrillation but has OSA untreated due to mask intolerance.  OSA:  Has CPAP but intolerant of mask, not wearing..  Wants to get the INSPIRE procedure.  Not sure if she is appropriate candidate given her cardiac  history.   Last INR checked at home was 2.2  3 weeks ago.    New onset gout:  Has had 2 gout attacks in the past 3 months involving the right great toe.   First time.  Started taking once daily colchicine initially prescribed by orthopedist ,  Wants me to take over. Has not had uric acid level checked.       Outpatient Medications Prior to Visit  Medication Sig Dispense Refill  . albuterol (PROVENTIL HFA;VENTOLIN HFA) 108 (90 Base) MCG/ACT inhaler Inhale 3 puffs into the lungs 4 (four) times daily. 6.7 g 0  . albuterol (PROVENTIL) (2.5 MG/3ML) 0.083% nebulizer solution Take 3 mLs (2.5 mg total) by nebulization every 4 (four) hours as needed for wheezing or shortness of breath. 150 mL 5  . carvedilol (COREG) 3.125 MG tablet TAKE 1 TABLET(3.125 MG) BY MOUTH EVERY 12 HOURS 180 tablet 1  . cetirizine (ZYRTEC) 10 MG tablet Take 1 tablet (10 mg total) by mouth daily. 30 tablet 0  . citalopram (CELEXA) 40 MG tablet TAKE 1 TABLET EVERY DAY 90 tablet 1  . colchicine 0.6 MG tablet Take 0.6 mg by mouth daily.    Marland Kitchen levothyroxine (SYNTHROID) 150 MCG tablet TAKE 1 TABLET(150 MCG) BY MOUTH DAILY 90 tablet 3  . lisinopril (PRINIVIL,ZESTRIL) 5 MG tablet Take 5 mg by mouth daily.     . mirabegron ER (MYRBETRIQ) 50 MG TB24 tablet Take 1 tablet (50 mg total) by mouth daily. 90 tablet 3  . torsemide (DEMADEX) 20 MG tablet Take 20 mg by mouth daily.    Marland Kitchen  traMADol (ULTRAM) 50 MG tablet Take 1 tablet (50 mg total) by mouth 2 (two) times daily. 60 tablet 5  . Vitamin D, Ergocalciferol, (DRISDOL) 1.25 MG (50000 UNIT) CAPS capsule TAKE 1 CAPSULE EVERY 7 DAYS 12 capsule 0  . warfarin (COUMADIN) 10 MG tablet Take 1 tablet (10 mg total) by mouth See admin instructions. (Patient taking differently: Take 10 mg by mouth See admin instructions. Takes three days a week) 60 tablet 5  . warfarin (COUMADIN) 5 MG tablet Take 5 mg by mouth daily. Takes 4 days a week     No facility-administered medications prior to visit.     Review of Systems;  Patient denies headache, fevers, malaise, unintentional weight loss, skin rash, eye pain, sinus congestion and sinus pain, sore throat, dysphagia,  hemoptysis , cough, dyspnea, wheezing, chest pain, palpitations, orthopnea, edema, abdominal pain, nausea, melena, diarrhea, constipation, flank pain, dysuria, hematuria, urinary  Frequency, nocturia, numbness, tingling, seizures,  Focal weakness, Loss of consciousness,  Tremor, insomnia, depression, anxiety, and suicidal ideation.      Objective:  BP (!) 116/58 (BP Location: Right Arm, Patient Position: Sitting, Cuff Size: Normal)   Pulse 71   Temp 98.2 F (36.8 C)   Ht 5' 5.98" (1.676 m)   Wt (!) 315 lb 12.8 oz (143.2 kg)   LMP 08/31/2013   SpO2 92%   BMI 51.00 kg/m   BP Readings from Last 3 Encounters:  12/08/20 (!) 116/58  11/30/20 124/60  07/12/20 (!) 127/32    Wt Readings from Last 3 Encounters:  12/08/20 (!) 315 lb 12.8 oz (143.2 kg)  11/30/20 (!) 302 lb (137 kg)  07/12/20 (!) 315 lb (142.9 kg)    General appearance: alert, cooperative and appears stated age Ears: normal TM's and external ear canals both ears Throat: lips, mucosa, and tongue normal; teeth and gums normal Neck: no adenopathy, no carotid bruit, supple, symmetrical, trachea midline and thyroid not enlarged, symmetric, no tenderness/mass/nodules Back: symmetric, no curvature. ROM normal. No CVA tenderness. Lungs: clear to auscultation bilaterally Heart: regular rate and rhythm, S1, S2 normal, no murmur, click, rub or gallop Abdomen: soft, non-tender; bowel sounds normal; no masses,  no organomegaly Pulses: 2+ and symmetric Skin: Skin color, texture, turgor normal. No rashes or lesions Lymph nodes: Cervical, supraclavicular, and axillary nodes normal.  Lab Results  Component Value Date   HGBA1C 5.4 09/10/2019    Lab Results  Component Value Date   CREATININE 1.24 (H) 09/10/2019   CREATININE 1.32 (H) 11/29/2017   CREATININE  1.14 03/18/2017    Lab Results  Component Value Date   WBC 7.3 09/10/2019   HGB 12.8 09/10/2019   HCT 40.3 09/10/2019   PLT 235 09/10/2019   GLUCOSE 88 09/10/2019   CHOL 196 09/10/2019   TRIG 99 09/10/2019   HDL 73 09/10/2019   LDLDIRECT 111.0 06/19/2016   LDLCALC 105 (H) 09/10/2019   ALT 13 09/10/2019   AST 14 09/10/2019   NA 140 09/10/2019   K 4.8 09/10/2019   CL 103 09/10/2019   CREATININE 1.24 (H) 09/10/2019   BUN 34 (H) 09/10/2019   CO2 21 09/10/2019   TSH 0.087 (L) 09/10/2019   INR 2.6 (H) 04/15/2015   HGBA1C 5.4 09/10/2019      Assessment & Plan:   Problem List Items Addressed This Visit      Unprioritized   Atrial fibrillation (HCC)    I have ordered and reviewed a 12 lead EKG and find that there are significant changesc  compared to previous EKG available ;  She appears to be in atrial fibrillation  And has an irreg irreg rhythm on auscultation as well.  Her history is supportive;  She has been more short of breath with daily activities and lung exam is clear.  Chronicity unclear. . She is already anticoagulated due to sT Jude valve.  Taking carvedilolol. Thyroid, labs,  BNP ordered, and patient advised to follow up with her cardiologist a at [facility] ASAP for management.  Briefly discussed the various treatment options which include medication, cardioversion and ablation,  The choice of which depends largely on her tolerance level of atrial fib when rate controlled. Needs OSA addressed;;  Will review her request for INSPIRE procedure and refer if appropriate       Relevant Orders   TSH   Magnesium   History of gout    Checking uric acid level today to determine if allopurinol l is needed .  Advised that daily colchicine may be the cause of her diarrhea and will be transitioned off .        Relevant Orders   Uric acid   Major depressive disorder, recurrent episode (HCC)    Adding 2 mg abilify to current regimen of d citalopra.  List of therapists given to patient        Morbid obesity (HCC)   OSA (obstructive sleep apnea)    Untreated for over a year due to mask intolerance.  Not sure if she is a candidate for alternative therapy (INSPIRE).  Will need referral and repeat sleep study if this is pursued       Preoperative clearance    She is NOT cleared for surgery due to new onset atrial fibrillation,which needs to be addressed by her cardiologist.        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 I am having Darrin Nipper. [employer]" start on ARIPiprazole. I am also having her maintain her albuterol, warfarin, albuterol, lisinopril, warfarin, torsemide, cetirizine, Vitamin D (Ergocalciferol), citalopram, levothyroxine, traMADol, mirabegron ER, colchicine, and carvedilol. We administered denosumab.  Meds ordered this encounter  Medications  . ARIPiprazole (ABILIFY) 2 MG tablet    Sig: Take 1 tablet (2 mg total) by mouth daily.    Dispense:  90 tablet    Refill:  1  . denosumab (PROLIA) injection 60 mg    There are no discontinued medications.  Follow-up: No follow-ups on  file.   Crecencio Mc, MD

## 2020-12-09 ENCOUNTER — Other Ambulatory Visit: Payer: Self-pay | Admitting: Internal Medicine

## 2020-12-09 ENCOUNTER — Telehealth: Payer: Self-pay | Admitting: Internal Medicine

## 2020-12-09 DIAGNOSIS — Z79899 Other long term (current) drug therapy: Secondary | ICD-10-CM

## 2020-12-09 MED ORDER — ALLOPURINOL 100 MG PO TABS: 100.0000 mg | ORAL_TABLET | Freq: Every day | ORAL | 6 refills | Status: DC

## 2020-12-09 MED ORDER — PREDNISONE 10 MG PO TABS: 20.0000 mg | ORAL_TABLET | Freq: Every day | ORAL | 0 refills | Status: DC

## 2020-12-09 MED ORDER — ATORVASTATIN CALCIUM 20 MG PO TABS: 20.0000 mg | ORAL_TABLET | Freq: Every day | ORAL | 3 refills | Status: DC

## 2020-12-09 MED ORDER — ARIPIPRAZOLE 2 MG PO TABS: 2.0000 mg | ORAL_TABLET | Freq: Every day | ORAL | 1 refills | Status: DC

## 2020-12-09 MED ORDER — VITAMIN D (ERGOCALCIFEROL) 1.25 MG (50000 UNIT) PO CAPS: ORAL_CAPSULE | ORAL | 1 refills | Status: DC

## 2020-12-09 NOTE — Telephone Encounter (Signed)
Could not tell by chart if the amount of prednisone is sufficient for the therapy prescribed , but the a mount will get patient through until provider is in office on [place] authorized the 7 tablets ordered.

## 2020-12-09 NOTE — Telephone Encounter (Signed)
[facility] pharmacy [phone] called aboutpredniSONE (DELTASONE) 10 MG tablet  wanted to know if the amount was right

## 2020-12-09 NOTE — Telephone Encounter (Signed)
JUST SAW THE ERROR,  CORRECTED AMOUNT SENT IN QTY 14

## 2020-12-09 NOTE — Telephone Encounter (Signed)
Called pharmacy and verified amount due to PCP not in office and I knew the amount would at least get patient through the weekend. Authorized 7 tablets.

## 2020-12-15 ENCOUNTER — Telehealth: Payer: Self-pay

## 2020-12-15 NOTE — Telephone Encounter (Signed)
Patient was returning call for lab results 

## 2020-12-15 NOTE — Telephone Encounter (Signed)
See result note message 

## 2020-12-15 NOTE — Telephone Encounter (Signed)
LMTCB in regards to lab results.  

## 2020-12-26 DIAGNOSIS — I480 Paroxysmal atrial fibrillation: Secondary | ICD-10-CM | POA: Diagnosis not present

## 2020-12-26 DIAGNOSIS — Z8774 Personal history of (corrected) congenital malformations of heart and circulatory system: Secondary | ICD-10-CM | POA: Diagnosis not present

## 2020-12-26 DIAGNOSIS — Z7901 Long term (current) use of anticoagulants: Secondary | ICD-10-CM | POA: Diagnosis not present

## 2020-12-26 DIAGNOSIS — I5033 Acute on chronic diastolic (congestive) heart failure: Secondary | ICD-10-CM | POA: Diagnosis not present

## 2020-12-29 DIAGNOSIS — I4892 Unspecified atrial flutter: Secondary | ICD-10-CM | POA: Diagnosis not present

## 2020-12-29 DIAGNOSIS — Z7901 Long term (current) use of anticoagulants: Secondary | ICD-10-CM | POA: Diagnosis not present

## 2020-12-29 DIAGNOSIS — Z96642 Presence of left artificial hip joint: Secondary | ICD-10-CM | POA: Diagnosis not present

## 2020-12-29 DIAGNOSIS — Z01810 Encounter for preprocedural cardiovascular examination: Secondary | ICD-10-CM | POA: Diagnosis not present

## 2020-12-29 DIAGNOSIS — Z8774 Personal history of (corrected) congenital malformations of heart and circulatory system: Secondary | ICD-10-CM | POA: Diagnosis not present

## 2020-12-29 DIAGNOSIS — I480 Paroxysmal atrial fibrillation: Secondary | ICD-10-CM | POA: Diagnosis not present

## 2020-12-29 DIAGNOSIS — Z96653 Presence of artificial knee joint, bilateral: Secondary | ICD-10-CM | POA: Diagnosis not present

## 2021-01-09 DIAGNOSIS — Q231 Congenital insufficiency of aortic valve: Secondary | ICD-10-CM | POA: Diagnosis not present

## 2021-01-09 DIAGNOSIS — Z952 Presence of prosthetic heart valve: Secondary | ICD-10-CM | POA: Diagnosis not present

## 2021-01-09 DIAGNOSIS — Z7901 Long term (current) use of anticoagulants: Secondary | ICD-10-CM | POA: Diagnosis not present

## 2021-01-10 ENCOUNTER — Other Ambulatory Visit: Payer: Self-pay

## 2021-01-10 DIAGNOSIS — Z8774 Personal history of (corrected) congenital malformations of heart and circulatory system: Secondary | ICD-10-CM | POA: Diagnosis not present

## 2021-01-10 DIAGNOSIS — Z7901 Long term (current) use of anticoagulants: Secondary | ICD-10-CM | POA: Diagnosis not present

## 2021-01-10 MED ORDER — WARFARIN SODIUM 10 MG PO TABS: 10.0000 mg | ORAL_TABLET | ORAL | 0 refills | Status: DC

## 2021-01-10 MED ORDER — WARFARIN SODIUM 5 MG PO TABS: 5.0000 mg | ORAL_TABLET | Freq: Every day | ORAL | 0 refills | Status: DC

## 2021-01-12 DIAGNOSIS — M79672 Pain in left foot: Secondary | ICD-10-CM | POA: Diagnosis not present

## 2021-01-12 DIAGNOSIS — M79671 Pain in right foot: Secondary | ICD-10-CM | POA: Diagnosis not present

## 2021-01-13 ENCOUNTER — Telehealth: Payer: Self-pay | Admitting: Internal Medicine

## 2021-01-13 DIAGNOSIS — Z7901 Long term (current) use of anticoagulants: Secondary | ICD-10-CM | POA: Diagnosis not present

## 2021-01-13 DIAGNOSIS — I712 Thoracic aortic aneurysm, without rupture: Secondary | ICD-10-CM | POA: Diagnosis not present

## 2021-01-13 DIAGNOSIS — Z8774 Personal history of (corrected) congenital malformations of heart and circulatory system: Secondary | ICD-10-CM | POA: Diagnosis not present

## 2021-01-13 DIAGNOSIS — I350 Nonrheumatic aortic (valve) stenosis: Secondary | ICD-10-CM | POA: Diagnosis not present

## 2021-01-13 DIAGNOSIS — I5033 Acute on chronic diastolic (congestive) heart failure: Secondary | ICD-10-CM | POA: Diagnosis not present

## 2021-01-13 NOTE — Telephone Encounter (Signed)
Reviewed patients chart. Pt does not need any refills at this time.  Coreg last refilled 11/30/20 and given 90 day supply with one refill. Lipitor last refilled 12/09/20 and given 90 day supply with 3 refills Zyloprim last refilled 12/09/20 and given 30 day supply with 6 refills Synthroid last refilled 07/04/2020 and 90 days with 3 refills

## 2021-01-13 NOTE — Telephone Encounter (Signed)
Mercedes from [facility] called to request refills of the following meds for the PT:  carvedilol (COREG) 3.125 MG tablet atorvastatin (LIPITOR) 20 MG tablet allopurinol (ZYLOPRIM) 100 MG tablet  levothyroxine (SYNTHROID) 150 MCG tablet

## 2021-01-30 ENCOUNTER — Telehealth: Payer: Self-pay | Admitting: Internal Medicine

## 2021-01-30 NOTE — Telephone Encounter (Signed)
PT would like for a refill of her traMADol (ULTRAM) 50 MG tablet to be called into the [facility] at [facility]

## 2021-01-31 MED ORDER — TRAMADOL HCL 50 MG PO TABS: 50.0000 mg | ORAL_TABLET | Freq: Two times a day (BID) | ORAL | 5 refills | Status: DC

## 2021-01-31 NOTE — Addendum Note (Signed)
Addended by: Sherlene Shams on: 01/31/2021 08:46 AM   Modules accepted: Orders

## 2021-02-01 ENCOUNTER — Ambulatory Visit: Payer: Medicare PPO

## 2021-02-27 ENCOUNTER — Ambulatory Visit: Payer: Medicare PPO

## 2021-03-01 ENCOUNTER — Ambulatory Visit: Payer: Medicare PPO

## 2021-03-06 ENCOUNTER — Telehealth: Payer: Self-pay | Admitting: Internal Medicine

## 2021-03-06 DIAGNOSIS — I5033 Acute on chronic diastolic (congestive) heart failure: Secondary | ICD-10-CM | POA: Diagnosis not present

## 2021-03-06 DIAGNOSIS — I350 Nonrheumatic aortic (valve) stenosis: Secondary | ICD-10-CM | POA: Diagnosis not present

## 2021-03-06 DIAGNOSIS — I480 Paroxysmal atrial fibrillation: Secondary | ICD-10-CM | POA: Diagnosis not present

## 2021-03-06 DIAGNOSIS — Z8774 Personal history of (corrected) congenital malformations of heart and circulatory system: Secondary | ICD-10-CM | POA: Diagnosis not present

## 2021-03-06 DIAGNOSIS — Z7901 Long term (current) use of anticoagulants: Secondary | ICD-10-CM | POA: Diagnosis not present

## 2021-03-06 NOTE — Telephone Encounter (Signed)
I received a request for medical clearance for her planned tooth extraction.  The information they requested needs to come from her cardiologist,  not from me because . 1)  I do not manage her INR,  and  2) if they think she needs to be on antibiotic prophylaxis prior to the procedure because of her aortic valve replacement , they should decide.

## 2021-03-08 ENCOUNTER — Other Ambulatory Visit: Payer: Self-pay | Admitting: Internal Medicine

## 2021-03-08 NOTE — Telephone Encounter (Signed)
LMTCB with the pt. Faxed the form back to the dental office stating on it that this will need to be addressed with pt's cardiologist pet Dr. Darrick Huntsman.

## 2021-03-09 NOTE — Telephone Encounter (Signed)
Spoke with pt and she is aware that the clearance has to be done by cardiology because they manage her INR.

## 2021-03-14 ENCOUNTER — Telehealth: Payer: Self-pay | Admitting: Internal Medicine

## 2021-03-14 NOTE — Telephone Encounter (Signed)
Re-verification in process due 06/10/21

## 2021-03-22 NOTE — Telephone Encounter (Signed)
[name]: [employer] - PA Case ID: [id number]

## 2021-03-29 DIAGNOSIS — M19079 Primary osteoarthritis, unspecified ankle and foot: Secondary | ICD-10-CM | POA: Diagnosis not present

## 2021-03-29 DIAGNOSIS — M2011 Hallux valgus (acquired), right foot: Secondary | ICD-10-CM | POA: Diagnosis not present

## 2021-03-29 DIAGNOSIS — M79672 Pain in left foot: Secondary | ICD-10-CM | POA: Diagnosis not present

## 2021-04-10 ENCOUNTER — Ambulatory Visit (INDEPENDENT_AMBULATORY_CARE_PROVIDER_SITE_OTHER): Payer: Medicare PPO

## 2021-04-10 ENCOUNTER — Other Ambulatory Visit: Payer: Self-pay | Admitting: Internal Medicine

## 2021-04-10 VITALS — Ht 65.98 in | Wt 332.0 lb

## 2021-04-10 DIAGNOSIS — Z0000 Encounter for general adult medical examination without abnormal findings: Secondary | ICD-10-CM | POA: Diagnosis not present

## 2021-04-10 DIAGNOSIS — Z1211 Encounter for screening for malignant neoplasm of colon: Secondary | ICD-10-CM

## 2021-04-10 NOTE — Patient Instructions (Addendum)
Maria Torres , Thank you for taking time to come for your Medicare Wellness Visit. I appreciate your ongoing commitment to your health goals. Please review the following plan we discussed and let me know if I can assist you in the future.   These are the goals we discussed:  Goals       Patient Stated     I want to prevent falls (pt-stated)      Fear of falling, cane/walker in use as needed Balance exercises      Low salt diet intake (pt-stated)      Healthy diet        This is a list of the screening recommended for you and due dates:  Health Maintenance  Topic Date Due   Mammogram  09/11/2020   COVID-19 Vaccine (4 - Booster for Moderna series) 04/26/2021*   Zoster (Shingles) Vaccine (1 of 2) 07/10/2021*   Flu Shot  10/20/2021*   Colon Cancer Screening  04/24/2021   Tetanus Vaccine  03/19/2024   DEXA scan (bone density measurement)  Completed   Hepatitis C Screening: USPSTF Recommendation to screen - Ages 83-79 yo.  Completed   HPV Vaccine  Aged Out  *Topic was postponed. The date shown is not the original due date.    Advanced directives: Copy of current HCPOA/Living Will requested.    Conditions/risks identified: none new.  Follow up in one year for your annual wellness visit    Preventive Care 65 Years and Older, Female Preventive care refers to lifestyle choices and visits with your health care provider that can promote health and wellness. What does preventive care include? A yearly physical exam. This is also called an annual well check. Dental exams once or twice a year. Routine eye exams. Ask your health care provider how often you should have your eyes checked. Personal lifestyle choices, including: Daily care of your teeth and gums. Regular physical activity. Eating a healthy diet. Avoiding tobacco and drug use. Limiting alcohol use. Practicing safe sex. Taking low-dose aspirin every day. Taking vitamin and mineral supplements as recommended by your  health care provider. What happens during an annual well check? The services and screenings done by your health care provider during your annual well check will depend on your age, overall health, lifestyle risk factors, and family history of disease. Counseling  Your health care provider may ask you questions about your: Alcohol use. Tobacco use. Drug use. Emotional well-being. Home and relationship well-being. Sexual activity. Eating habits. History of falls. Memory and ability to understand (cognition). Work and work Astronomer. Reproductive health. Screening  You may have the following tests or measurements: Height, weight, and BMI. Blood pressure. Lipid and cholesterol levels. These may be checked every 5 years, or more frequently if you are over 49 years old. Skin check. Lung cancer screening. You may have this screening every year starting at age 41 if you have a 30-pack-year history of smoking and currently smoke or have quit within the past 15 years. Fecal occult blood test (FOBT) of the stool. You may have this test every year starting at age 29. Flexible sigmoidoscopy or colonoscopy. You may have a sigmoidoscopy every 5 years or a colonoscopy every 10 years starting at age 40. Hepatitis C blood test. Hepatitis B blood test. Sexually transmitted disease (STD) testing. Diabetes screening. This is done by checking your blood sugar (glucose) after you have not eaten for a while (fasting). You may have this done every 1-3 years. Bone density scan.  This is done to screen for osteoporosis. You may have this done starting at age 52. Mammogram. This may be done every 1-2 years. Talk to your health care provider about how often you should have regular mammograms. Talk with your health care provider about your test results, treatment options, and if necessary, the need for more tests. Vaccines  Your health care provider may recommend certain vaccines, such as: Influenza vaccine.  This is recommended every year. Tetanus, diphtheria, and acellular pertussis (Tdap, Td) vaccine. You may need a Td booster every 10 years. Zoster vaccine. You may need this after age 33. Pneumococcal 13-valent conjugate (PCV13) vaccine. One dose is recommended after age 12. Pneumococcal polysaccharide (PPSV23) vaccine. One dose is recommended after age 21. Talk to your health care provider about which screenings and vaccines you need and how often you need them. This information is not intended to replace advice given to you by your health care provider. Make sure you discuss any questions you have with your health care provider. Document Released: 08/05/2015 Document Revised: 03/28/2016 Document Reviewed: 05/10/2015 Elsevier Interactive Patient Education  2017 [place] Prevention in the Home Falls can cause injuries. They can happen to people of all ages. There are many things you can do to make your home safe and to help prevent falls. What can I do on the outside of my home? Regularly fix the edges of walkways and driveways and fix any cracks. Remove anything that might make you trip as you walk through a door, such as a raised step or threshold. Trim any bushes or trees on the path to your home. Use bright outdoor lighting. Clear any walking paths of anything that might make someone trip, such as rocks or tools. Regularly check to see if handrails are loose or broken. Make sure that both sides of any steps have handrails. Any raised decks and porches should have guardrails on the edges. Have any leaves, snow, or ice cleared regularly. Use sand or salt on walking paths during winter. Clean up any spills in your garage right away. This includes oil or grease spills. What can I do in the bathroom? Use night lights. Install grab bars by the toilet and in the tub and shower. Do not use towel bars as grab bars. Use non-skid mats or decals in the tub or shower. If you need to sit  down in the shower, use a plastic, non-slip stool. Keep the floor dry. Clean up any water that spills on the floor as soon as it happens. Remove soap buildup in the tub or shower regularly. Attach bath mats securely with double-sided non-slip rug tape. Do not have throw rugs and other things on the floor that can make you trip. What can I do in the bedroom? Use night lights. Make sure that you have a light by your bed that is easy to reach. Do not use any sheets or blankets that are too big for your bed. They should not hang down onto the floor. Have a firm chair that has side arms. You can use this for support while you get dressed. Do not have throw rugs and other things on the floor that can make you trip. What can I do in the kitchen? Clean up any spills right away. Avoid walking on wet floors. Keep items that you use a lot in easy-to-reach places. If you need to reach something above you, use a strong step stool that has a grab bar. Keep electrical cords  out of the way. Do not use floor polish or wax that makes floors slippery. If you must use wax, use non-skid floor wax. Do not have throw rugs and other things on the floor that can make you trip. What can I do with my stairs? Do not leave any items on the stairs. Make sure that there are handrails on both sides of the stairs and use them. Fix handrails that are broken or loose. Make sure that handrails are as long as the stairways. Check any carpeting to make sure that it is firmly attached to the stairs. Fix any carpet that is loose or worn. Avoid having throw rugs at the top or bottom of the stairs. If you do have throw rugs, attach them to the floor with carpet tape. Make sure that you have a light switch at the top of the stairs and the bottom of the stairs. If you do not have them, ask someone to add them for you. What else can I do to help prevent falls? Wear shoes that: Do not have high heels. Have rubber bottoms. Are  comfortable and fit you well. Are closed at the toe. Do not wear sandals. If you use a stepladder: Make sure that it is fully opened. Do not climb a closed stepladder. Make sure that both sides of the stepladder are locked into place. Ask someone to hold it for you, if possible. Clearly mark and make sure that you can see: Any grab bars or handrails. First and last steps. Where the edge of each step is. Use tools that help you move around (mobility aids) if they are needed. These include: Canes. Walkers. Scooters. Crutches. Turn on the lights when you go into a dark area. Replace any light bulbs as soon as they burn out. Set up your furniture so you have a clear path. Avoid moving your furniture around. If any of your floors are uneven, fix them. If there are any pets around you, be aware of where they are. Review your medicines with your doctor. Some medicines can make you feel dizzy. This can increase your chance of falling. Ask your doctor what other things that you can do to help prevent falls. This information is not intended to replace advice given to you by your health care provider. Make sure you discuss any questions you have with your health care provider. Document Released: 05/05/2009 Document Revised: 12/15/2015 Document Reviewed: 08/13/2014 Elsevier Interactive Patient Education  2017 [employer]  Colonoscopy, Adult A colonoscopy is a procedure to look at the entire large intestine. This procedure is done using a long, thin, flexible tube that has a camera on the end. You may have a colonoscopy: As a part of normal colorectal screening. If you have certain symptoms, such as: A low number of red blood cells in your blood (anemia). Diarrhea that does not go away. Pain in your abdomen. Blood in your stool. A colonoscopy can help screen for and diagnose medical problems, including: Tumors. Extra tissue that grows where mucus forms (polyps). Inflammation. Areas of  bleeding. Tell your health care provider about: Any allergies you have. All medicines you are taking, including vitamins, herbs, eye drops, creams, and over-the-counter medicines. Any problems you or family members have had with anesthetic medicines. Any blood disorders you have. Any surgeries you have had. Any medical conditions you have. Any problems you have had with having bowel movements. Whether you are pregnant or may be pregnant. What are the risks? Generally, this is  a safe procedure. However, problems may occur, including: Bleeding. Damage to your intestine. Allergic reactions to medicines given during the procedure. Infection. This is rare. What happens before the procedure? Eating and drinking restrictions Follow instructions from your health care provider about eating or drinking restrictions, which may include: A few days before the procedure: Follow a low-fiber diet. Avoid nuts, seeds, dried fruit, raw fruits, and vegetables. 1-3 days before the procedure: Eat only gelatin dessert or ice pops. Drink only clear liquids, such as water, clear juice, clear broth or bouillon, black coffee or tea, or clear soft drinks or sports drinks. Avoid liquids that contain red or purple dye. The day of the procedure: Do not eat solid foods. You may continue to drink clear liquids until up to 2 hours before the procedure. Do not eat or drink anything starting 2 hours before the procedure, or within the time period that your health care provider recommends. Bowel prep If you were prescribed a bowel prep to take by mouth (orally) to clean out your colon: Take it as told by your health care provider. Starting the day before your procedure, you will need to drink a large amount of liquid medicine. The liquid will cause you to have many bowel movements of loose stool until your stool becomes almost clear or light green. If your skin or the opening between the buttocks (anus) gets irritated  from diarrhea, you may relieve the irritation using: Wipes with medicine in them, such as adult wet wipes with aloe and vitamin E. A product to soothe skin, such as petroleum jelly. If you vomit while drinking the bowel prep: Take a break for up to 60 minutes. Begin the bowel prep again. Call your health care provider if you keep vomiting or you cannot take the bowel prep without vomiting. To clean out your colon, you may also be given: Laxative medicines. These help you have a bowel movement. Instructions for enema use. An enema is liquid medicine injected into your rectum. Medicines Ask your health care provider about: Changing or stopping your regular medicines or supplements. This is especially important if you are taking iron supplements, diabetes medicines, or blood thinners. Taking medicines such as aspirin and ibuprofen. These medicines can thin your blood. Do not take these medicines unless your health care provider tells you to take them. Taking over-the-counter medicines, vitamins, herbs, and supplements. General instructions Ask your health care provider what steps will be taken to help prevent infection. These may include washing skin with a germ-killing soap. Plan to have someone take you home from the hospital or clinic. What happens during the procedure?  An IV will be inserted into one of your veins. You may be given one or more of the following: A medicine to help you relax (sedative). A medicine to numb the area (local anesthetic). A medicine to make you fall asleep (general anesthetic). This is rarely needed. You will lie on your side with your knees bent. The tube will: Have oil or gel put on it (be lubricated). Be inserted into your anus. Be gently eased through all parts of your large intestine. Air will be sent into your colon to keep it open. This may cause some pressure or cramping. Images will be taken with the camera and will appear on a screen. A small  tissue sample may be removed to be looked at under a microscope (biopsy). The tissue may be sent to a lab for testing if any signs of problems are found.  If small polyps are found, they may be removed and checked for cancer cells. When the procedure is finished, the tube will be removed. The procedure may vary among health care providers and hospitals. What happens after the procedure? Your blood pressure, heart rate, breathing rate, and blood oxygen level will be monitored until you leave the hospital or clinic. You may have a small amount of blood in your stool. You may pass gas and have mild cramping or bloating in your abdomen. This is caused by the air that was used to open your colon during the exam. Do not drive for 24 hours after the procedure. It is up to you to get the results of your procedure. Ask your health care provider, or the department that is doing the procedure, when your results will be ready. Summary A colonoscopy is a procedure to look at the entire large intestine. Follow instructions from your health care provider about eating and drinking before the procedure. If you were prescribed an oral bowel prep to clean out your colon, take it as told by your health care provider. During the colonoscopy, a flexible tube with a camera on its end is inserted into the anus and then passed into the other parts of the large intestine. This information is not intended to replace advice given to you by your health care provider. Make sure you discuss any questions you have with your health care provider. Document Revised: 01/30/2019 Document Reviewed: 01/30/2019 Elsevier Patient Education  2022 [employer].

## 2021-04-10 NOTE — Progress Notes (Addendum)
Subjective:   Maria Torres is a 75 y.o. female who presents for Medicare Annual (Subsequent) preventive examination.  Review of Systems    No ROS.  Medicare Wellness Virtual Visit.  Visual/audio telehealth visit, UTA vital signs.   See social history for additional risk factors.   Cardiac Risk Factors include: advanced age (>20men, >56 women)     Objective:    Today's Vitals   04/10/21 1535  Weight: (!) 332 lb (150.6 kg)  Height: 5' 5.98" (1.676 m)   Body mass index is 53.62 kg/m.  Advanced Directives 04/10/2021 02/01/2020  Does Patient Have a Medical Advance Directive? Yes Yes  Type of Advance Directive - Healthcare Power of [place];Living will  Does patient want to make changes to medical advance directive? No - Patient declined No - Patient declined  Copy of Healthcare Power of Attorney in Chart? - No - copy requested    Current Medications (verified) Outpatient Encounter Medications as of 04/10/2021  Medication Sig   albuterol (PROVENTIL HFA;VENTOLIN HFA) 108 (90 Base) MCG/ACT inhaler Inhale 3 puffs into the lungs 4 (four) times daily.   albuterol (PROVENTIL) (2.5 MG/3ML) 0.083% nebulizer solution Take 3 mLs (2.5 mg total) by nebulization every 4 (four) hours as needed for wheezing or shortness of breath.   allopurinol (ZYLOPRIM) 100 MG tablet Take 1 tablet (100 mg total) by mouth daily.   ARIPiprazole (ABILIFY) 2 MG tablet TAKE 1 TABLET(2 MG) BY MOUTH DAILY   atorvastatin (LIPITOR) 20 MG tablet Take 1 tablet (20 mg total) by mouth daily.   carvedilol (COREG) 3.125 MG tablet TAKE 1 TABLET(3.125 MG) BY MOUTH EVERY 12 HOURS   cetirizine (ZYRTEC) 10 MG tablet Take 1 tablet (10 mg total) by mouth daily.   citalopram (CELEXA) 40 MG tablet TAKE 1 TABLET EVERY DAY   levothyroxine (SYNTHROID) 150 MCG tablet TAKE 1 TABLET(150 MCG) BY MOUTH DAILY   lisinopril (PRINIVIL,ZESTRIL) 5 MG tablet Take 5 mg by mouth daily.    mirabegron ER (MYRBETRIQ) 50 MG TB24 tablet Take 1 tablet  (50 mg total) by mouth daily.   torsemide (DEMADEX) 20 MG tablet Take 20 mg by mouth daily.   traMADol (ULTRAM) 50 MG tablet Take 1 tablet (50 mg total) by mouth 2 (two) times daily.   Vitamin D, Ergocalciferol, (DRISDOL) 1.25 MG (50000 UNIT) CAPS capsule TAKE 1 CAPSULE BY MOUTH EVERY 7 DAYS   warfarin (COUMADIN) 10 MG tablet Take 1 tablet (10 mg total) by mouth See admin instructions. Takes three days a week   warfarin (COUMADIN) 5 MG tablet Take 1 tablet (5 mg total) by mouth daily. Takes 4 days a week   [DISCONTINUED] predniSONE (DELTASONE) 10 MG tablet Take 2 tablets (20 mg total) by mouth daily with breakfast.   No facility-administered encounter medications on file as of 04/10/2021.    Allergies (verified) Tape   History: Past Medical History:  Diagnosis Date   Anemia    Aortic aneurysm (HCC) 09/22/2013   Aortic stenosis    Due to bicuspid aortic valve. 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   Cervicalgia    CHF (congestive heart failure) (HCC)    GERD (gastroesophageal reflux disease)    Headache(784.0)    Hx of glaucoma    Hyperlipidemia    Hypertension    Mitral valve disorder    Morbid obesity (HCC)    Obstructive sleep  apnea on CPAP    Thyroid disease    hypothyrodism   Past Surgical History:  Procedure Laterality Date   ASCENDING AORTIC ANEURYSM REPAIR W/ TISSUE AORTIC VALVE REPLACEMENT     bilateral arthroscopic knee surgery  2001 & 2002   CARDIAC CATHETERIZATION  2004   [facility]   CARDIAC CATHETERIZATION  2008   [facility]   CHOLECYSTECTOMY     gastric banding surgery     @ [place] 04-16-96   total left hip replacement  01-20-2007   unilateral total knee replacement  08/2002   Family History  Problem Relation Age of Onset   Coronary artery disease Mother    Heart disease Mother 74   COPD Mother    Hyperlipidemia Mother    Cancer Father        testicular    Pulmonary embolism Father    Obesity Sister        and knee problems   Cancer Sister        breast   Cancer Brother        throat   Social History   Socioeconomic History   Marital status: Single    Spouse name: Not on file   Number of children: Not on file   Years of education: Not on file   Highest education level: Not on file  Occupational History   Not on file  Tobacco Use   Smoking status: Never   Smokeless tobacco: Never  Substance and Sexual Activity   Alcohol use: Yes    Alcohol/week: 1.0 - 2.0 standard drink    Types: 1 - 2 Standard drinks or equivalent per week    Comment: social   Drug use: No   Sexual activity: Not on file  Other Topics Concern   Not on file  Social History Narrative   Not on file   Social Determinants of Health   Financial Resource Strain: Low Risk    Difficulty of Paying Living Expenses: Not hard at all  Food Insecurity: No Food Insecurity   Worried About Programme researcher, broadcasting/film/video in the Last Year: Never true   Ran Out of Food in the Last Year: Never true  Transportation Needs: No Transportation Needs   Lack of Transportation (Medical): No   Lack of Transportation (Non-Medical): No  Physical Activity: Not on file  Stress: No Stress Concern Present   Feeling of Stress : Only a little  Social Connections: Unknown   Frequency of Communication with Friends and Family: More than three times a week   Frequency of Social Gatherings with Friends and Family: Three times a week   Attends Religious Services: Not on file   Active Member of Clubs or Organizations: Yes   Attends Club or Organization Meetings: 1 to 4 times per year   Marital Status: Not on file    Tobacco Counseling Counseling given: Not Answered   Clinical Intake:  Pre-visit preparation completed: Yes        Diabetes: No  How often do you need to have someone help you when you read instructions, pamphlets, or other written materials from your doctor or pharmacy?: 1 -  Never   Interpreter Needed?: No      Activities of Daily Living In your present state of health, do you have any difficulty performing the following activities: 04/10/2021  Hearing? N  Vision? N  Difficulty concentrating or making decisions? N  Walking or climbing stairs? Y  Comment SOBOE  Dressing or bathing? N  Doing errands, shopping? N  Preparing Food and eating ? N  Using the Toilet? N  In the past six months, have you accidently leaked urine? Y  Comment Managed with daily pad  Do you have problems with loss of bowel control? N  Managing your Medications? N  Managing your Finances? N  Housekeeping or managing your Housekeeping? N  Some recent data might be hidden   Patient Care Team: Sherlene Shams, MD as PCP - General (Internal Medicine)  Indicate any recent Medical Services you may have received from other than [facility] providers in the past year (date may be approximate).     Assessment:   This is a routine wellness examination for Maria Torres.  I connected with Maria Torres today by telephone and verified that I am speaking with the correct person using two identifiers. Location patient: home Location provider: work Persons participating in the virtual visit: patient, Engineer, civil (consulting).    I discussed the limitations, risks, security and privacy concerns of performing an evaluation and management service by telephone and the availability of in person appointments. The patient expressed understanding and verbally consented to this telephonic visit.    Interactive audio and video telecommunications were attempted between this provider and patient, however failed, due to patient having technical difficulties OR patient did not have access to video capability.  We continued and completed visit with audio only.  Some vital signs may be absent or patient reported.   Hearing/Vision screen Hearing Screening - Comments:: Patient is able to hear conversational tones without difficulty.  No  issues reported. Vision Screening - Comments:: Wears corrective lenses  They have seen their ophthalmologist in the last 12-24 months.  Dietary issues and exercise activities discussed: Current Exercise Habits: Home exercise routine, Intensity: Mild Regular diet Good water intake   Goals Addressed               This Visit's Progress     Patient Stated     I want to prevent falls (pt-stated)   On track     Fear of falling, cane/walker in use as needed Balance exercises      Low salt diet intake (pt-stated)   On track     Healthy diet       Depression Screen PHQ 2/9 Scores 04/10/2021 12/08/2020 11/30/2020 02/01/2020 12/09/2018 03/18/2017 06/19/2016  PHQ - 2 Score 0 0 5 1 0 0 0  PHQ- 9 Score - - 16 - 0 - -    Fall Risk Fall Risk  04/10/2021 12/08/2020 11/30/2020 07/12/2020 03/18/2020  Falls in the past year? 1 1 0 0 0  Number falls in past yr: - 0 - - -  Injury with Fall? 0 0 - - -  Follow up Falls evaluation completed Falls evaluation completed Falls evaluation completed Falls evaluation completed Falls evaluation completed   FALL RISK PREVENTION PERTAINING TO THE HOME: Adequate lighting in your home to reduce risk of falls? Yes   ASSISTIVE DEVICES UTILIZED TO PREVENT FALLS: Life alert? No  Use of a cane, walker or w/c? Yes   TIMED UP AND GO: Was the test performed? No .   Cognitive Function:     6CIT Screen 04/10/2021 02/01/2020  What Year? 0 points 0 points  What month? 0 points 0 points  Count back from 20 0 points 0 points  Months in reverse 0 points 0 points  Repeat phrase 0 points 2 points    Immunizations Immunization History  Administered Date(s) Administered   Influenza  Split 05/23/2014   Influenza,inj,Quad PF,6+ Mos 04/15/2015   Moderna Sars-Covid-2 Vaccination 08/05/2019, 09/02/2019, 08/10/2020   PNEUMOCOCCAL CONJUGATE-20 12/08/2020   Pneumococcal Polysaccharide-23 11/17/2013   Tdap 03/19/2014   Shingles vaccine- Due, Education has been provided  regarding the importance of this vaccine. Advised may receive this vaccine at local pharmacy or Health Dept. Aware to provide a copy of the vaccination record if obtained from local pharmacy or Health Dept. Verbalized acceptance and understanding. Deferred.   Mammogram- plans to receive in [place]. Deferred.   Health Maintenance Health Maintenance  Topic Date Due   MAMMOGRAM  09/11/2020   COVID-19 Vaccine (4 - Booster for Moderna series) 04/26/2021 (Originally 11/02/2020)   Zoster Vaccines- Shingrix (1 of 2) 07/10/2021 (Originally 07/12/1965)   INFLUENZA VACCINE  10/20/2021 (Originally 02/20/2021)   COLONOSCOPY (Pts 45-6yrs Insurance coverage will need to be confirmed)  04/24/2021   TETANUS/TDAP  03/19/2024   DEXA SCAN  Completed   Hepatitis C Screening  Completed   HPV VACCINES  Aged Out   Colonoscopy- completed 2012. Agrees to complete at [facility]. Ordered per patient approval. Facility to contact patient.   Lung Cancer Screening: (Low Dose CT Chest recommended if Age 33-80 years, 30 pack-year currently smoking OR have quit w/in 15years.) does not qualify.   Vision Screening: Recommended annual ophthalmology exams for early detection of glaucoma and other disorders of the eye.  Dental Screening: Recommended annual dental exams for proper oral hygiene.  Community Resource Referral / Chronic Care Management: CRR required this visit?  No   CCM required this visit?  No      Plan:   Keep all routine maintenance appointments.   I have personally reviewed and noted the following in the patient's chart:   Medical and social history Use of alcohol, tobacco or illicit drugs  Current medications and supplements including opioid prescriptions. Taking Ultram. Followed by pcp.  Functional ability and status Nutritional status Physical activity Advanced directives List of other physicians Hospitalizations, surgeries, and ER visits in previous 12 months Vitals Screenings to  include cognitive, depression, and falls Referrals and appointments  In addition, I have reviewed and discussed with patient certain preventive protocols, quality metrics, and best practice recommendations. A written personalized care plan for preventive services as well as general preventive health recommendations were provided to patient via mychart.     OBrien-Blaney, Runette Scifres L, LPN   7/85/8850     I have reviewed the above information and agree with above.   Duncan Dull, MD

## 2021-04-11 ENCOUNTER — Other Ambulatory Visit: Payer: Self-pay | Admitting: Internal Medicine

## 2021-04-11 DIAGNOSIS — I5033 Acute on chronic diastolic (congestive) heart failure: Secondary | ICD-10-CM | POA: Diagnosis not present

## 2021-04-17 ENCOUNTER — Telehealth: Payer: Self-pay

## 2021-04-17 ENCOUNTER — Other Ambulatory Visit: Payer: Self-pay

## 2021-04-17 DIAGNOSIS — Z1211 Encounter for screening for malignant neoplasm of colon: Secondary | ICD-10-CM

## 2021-04-17 MED ORDER — NA SULFATE-K SULFATE-MG SULF 17.5-3.13-1.6 GM/177ML PO SOLN: 354.0000 mL | Freq: Once | ORAL | 0 refills | Status: AC

## 2021-04-17 NOTE — Telephone Encounter (Signed)
Gastroenterology Pre-Procedure Review  Request Date: 06/02/2021 Requesting Physician: Dr. Tobi Bastos  PATIENT REVIEW QUESTIONS: The patient responded to the following health history questions as indicated:    1. Are you having any GI issues? no 2. Do you have a personal history of Polyps? no 3. Do you have a family history of Colon Cancer or Polyps? no 4. Diabetes Mellitus? no 5. Joint replacements in the past 12 months?no 6. Major health problems in the past 3 months?no 7. Any artificial heart valves, MVP, or defibrillator?Artifical aortic value     MEDICATIONS & ALLERGIES:    Patient reports the following regarding taking any anticoagulation/antiplatelet therapy:   Plavix, Coumadin, Eliquis, Xarelto, Lovenox, Pradaxa, Brilinta, or Effient? Yes Warfarin [place]  Aspirin? no  Patient confirms/reports the following medications:  Current Outpatient Medications  Medication Sig Dispense Refill   albuterol (PROVENTIL HFA;VENTOLIN HFA) 108 (90 Base) MCG/ACT inhaler Inhale 3 puffs into the lungs 4 (four) times daily. 6.7 g 0   albuterol (PROVENTIL) (2.5 MG/3ML) 0.083% nebulizer solution Take 3 mLs (2.5 mg total) by nebulization every 4 (four) hours as needed for wheezing or shortness of breath. 150 mL 5   allopurinol (ZYLOPRIM) 100 MG tablet Take 1 tablet (100 mg total) by mouth daily. 30 tablet 6   ARIPiprazole (ABILIFY) 2 MG tablet TAKE 1 TABLET(2 MG) BY MOUTH DAILY 90 tablet 1   atorvastatin (LIPITOR) 20 MG tablet Take 1 tablet (20 mg total) by mouth daily. 90 tablet 3   carvedilol (COREG) 3.125 MG tablet TAKE 1 TABLET(3.125 MG) BY MOUTH EVERY 12 HOURS 180 tablet 1   cetirizine (ZYRTEC) 10 MG tablet Take 1 tablet (10 mg total) by mouth daily. 30 tablet 0   citalopram (CELEXA) 40 MG tablet TAKE 1 TABLET EVERY DAY 90 tablet 1   levothyroxine (SYNTHROID) 150 MCG tablet TAKE 1 TABLET(150 MCG) BY MOUTH DAILY 90 tablet 3   lisinopril (PRINIVIL,ZESTRIL) 5 MG tablet Take 5 mg by mouth daily.       mirabegron ER (MYRBETRIQ) 50 MG TB24 tablet Take 1 tablet (50 mg total) by mouth daily. 90 tablet 3   torsemide (DEMADEX) 20 MG tablet Take 20 mg by mouth daily.     traMADol (ULTRAM) 50 MG tablet Take 1 tablet (50 mg total) by mouth 2 (two) times daily. 60 tablet 5   Vitamin D, Ergocalciferol, (DRISDOL) 1.25 MG (50000 UNIT) CAPS capsule TAKE 1 CAPSULE BY MOUTH EVERY 7 DAYS 12 capsule 1   warfarin (COUMADIN) 10 MG tablet Take 1 tablet (10 mg total) by mouth See admin instructions. Takes three days a week 90 tablet 0   warfarin (COUMADIN) 5 MG tablet TAKE 1 TABLET DAILY FOUR DAYS A WEEK 52 tablet 1   No current facility-administered medications for this visit.    Patient confirms/reports the following allergies:  Allergies  Allergen Reactions   Tape Rash    No orders of the defined types were placed in this encounter.   AUTHORIZATION INFORMATION Primary Insurance: 1D#: Group #:  Secondary Insurance: 1D#: Group #:  SCHEDULE INFORMATION: Date:  Time: Location:

## 2021-05-10 ENCOUNTER — Telehealth: Payer: Self-pay | Admitting: Internal Medicine

## 2021-05-10 DIAGNOSIS — M19072 Primary osteoarthritis, left ankle and foot: Secondary | ICD-10-CM | POA: Diagnosis not present

## 2021-05-10 DIAGNOSIS — M19071 Primary osteoarthritis, right ankle and foot: Secondary | ICD-10-CM | POA: Diagnosis not present

## 2021-05-10 NOTE — Telephone Encounter (Signed)
Patient is calling in to see if she had her pneumonia vaccine at her last visit with Dr.Tullo.Please call her at [phone].

## 2021-05-10 NOTE — Telephone Encounter (Signed)
Spoke with pt to let her know that she did receive the Prevnar-20 here at our office on 12/08/2020. Pt gave a verbal understanding.

## 2021-05-19 NOTE — Telephone Encounter (Signed)
Patient scheduled 08-16-20

## 2021-05-29 ENCOUNTER — Telehealth: Payer: Self-pay

## 2021-05-29 NOTE — Telephone Encounter (Signed)
Called to inform patient of medical clearance has not been received yet. I called Dr. Elesa Massed office and let a message regarding clearance. Patient informed me she is sick and will call back to reschedule procedure. Endo unit has been notified of change.

## 2021-06-02 ENCOUNTER — Ambulatory Visit: Admission: RE | Admit: 2021-06-02 | Payer: Medicare PPO | Source: Home / Self Care | Admitting: Gastroenterology

## 2021-06-02 ENCOUNTER — Encounter: Admission: RE | Payer: Self-pay | Source: Home / Self Care

## 2021-06-02 SURGERY — COLONOSCOPY WITH PROPOFOL
Anesthesia: General

## 2021-06-05 DIAGNOSIS — J22 Unspecified acute lower respiratory infection: Secondary | ICD-10-CM | POA: Diagnosis not present

## 2021-06-05 DIAGNOSIS — Z20822 Contact with and (suspected) exposure to covid-19: Secondary | ICD-10-CM | POA: Diagnosis not present

## 2021-06-05 DIAGNOSIS — R0981 Nasal congestion: Secondary | ICD-10-CM | POA: Diagnosis not present

## 2021-06-05 DIAGNOSIS — R0789 Other chest pain: Secondary | ICD-10-CM | POA: Diagnosis not present

## 2021-06-05 DIAGNOSIS — Z6841 Body Mass Index (BMI) 40.0 and over, adult: Secondary | ICD-10-CM | POA: Diagnosis not present

## 2021-06-05 DIAGNOSIS — R059 Cough, unspecified: Secondary | ICD-10-CM | POA: Diagnosis not present

## 2021-06-05 DIAGNOSIS — R5381 Other malaise: Secondary | ICD-10-CM | POA: Diagnosis not present

## 2021-06-05 DIAGNOSIS — R062 Wheezing: Secondary | ICD-10-CM | POA: Diagnosis not present

## 2021-06-05 DIAGNOSIS — J449 Chronic obstructive pulmonary disease, unspecified: Secondary | ICD-10-CM | POA: Diagnosis not present

## 2021-06-15 ENCOUNTER — Other Ambulatory Visit: Payer: Self-pay | Admitting: Internal Medicine

## 2021-06-20 ENCOUNTER — Other Ambulatory Visit: Payer: Self-pay

## 2021-06-20 MED ORDER — ATORVASTATIN CALCIUM 20 MG PO TABS: 20.0000 mg | ORAL_TABLET | Freq: Every day | ORAL | 1 refills | Status: DC

## 2021-06-20 MED ORDER — ALLOPURINOL 100 MG PO TABS
ORAL_TABLET | ORAL | 1 refills | Status: DC
Start: 2021-06-20 — End: 2021-12-19

## 2021-06-26 ENCOUNTER — Ambulatory Visit: Payer: Medicare PPO

## 2021-08-04 ENCOUNTER — Ambulatory Visit: Payer: Medicare PPO | Admitting: Internal Medicine

## 2021-08-10 DIAGNOSIS — I517 Cardiomegaly: Secondary | ICD-10-CM | POA: Diagnosis not present

## 2021-08-10 DIAGNOSIS — Z8774 Personal history of (corrected) congenital malformations of heart and circulatory system: Secondary | ICD-10-CM | POA: Diagnosis not present

## 2021-08-10 DIAGNOSIS — I5033 Acute on chronic diastolic (congestive) heart failure: Secondary | ICD-10-CM | POA: Diagnosis not present

## 2021-08-10 DIAGNOSIS — I480 Paroxysmal atrial fibrillation: Secondary | ICD-10-CM | POA: Diagnosis not present

## 2021-08-10 DIAGNOSIS — I712 Thoracic aortic aneurysm, without rupture, unspecified: Secondary | ICD-10-CM | POA: Diagnosis not present

## 2021-08-10 DIAGNOSIS — R0600 Dyspnea, unspecified: Secondary | ICD-10-CM | POA: Diagnosis not present

## 2021-08-10 DIAGNOSIS — Z7901 Long term (current) use of anticoagulants: Secondary | ICD-10-CM | POA: Diagnosis not present

## 2021-08-22 ENCOUNTER — Other Ambulatory Visit: Payer: Self-pay

## 2021-08-22 MED ORDER — LEVOTHYROXINE SODIUM 150 MCG PO TABS: ORAL_TABLET | ORAL | 0 refills | Status: DC

## 2021-08-25 ENCOUNTER — Other Ambulatory Visit: Payer: Self-pay | Admitting: Internal Medicine

## 2021-08-25 ENCOUNTER — Ambulatory Visit: Payer: Medicare PPO | Admitting: Internal Medicine

## 2021-08-25 ENCOUNTER — Telehealth: Payer: Self-pay | Admitting: Internal Medicine

## 2021-08-25 ENCOUNTER — Other Ambulatory Visit: Payer: Self-pay

## 2021-08-25 MED ORDER — TRAMADOL HCL 50 MG PO TABS: 50.0000 mg | ORAL_TABLET | Freq: Two times a day (BID) | ORAL | 0 refills | Status: DC

## 2021-08-25 NOTE — Telephone Encounter (Signed)
Refilled: 01/31/2021 Last OV: 12/08/2020 Next OV: 09/18/2021

## 2021-09-04 DIAGNOSIS — Z952 Presence of prosthetic heart valve: Secondary | ICD-10-CM | POA: Diagnosis not present

## 2021-09-04 DIAGNOSIS — Z7901 Long term (current) use of anticoagulants: Secondary | ICD-10-CM | POA: Diagnosis not present

## 2021-09-04 DIAGNOSIS — I480 Paroxysmal atrial fibrillation: Secondary | ICD-10-CM | POA: Diagnosis not present

## 2021-09-18 ENCOUNTER — Ambulatory Visit: Payer: Medicare PPO | Admitting: Internal Medicine

## 2021-10-05 ENCOUNTER — Other Ambulatory Visit: Payer: Self-pay | Admitting: Internal Medicine

## 2021-10-13 ENCOUNTER — Ambulatory Visit: Payer: Medicare PPO | Admitting: Internal Medicine

## 2021-11-08 DIAGNOSIS — Z7901 Long term (current) use of anticoagulants: Secondary | ICD-10-CM | POA: Diagnosis not present

## 2021-11-08 DIAGNOSIS — R052 Subacute cough: Secondary | ICD-10-CM | POA: Diagnosis not present

## 2021-11-08 DIAGNOSIS — I10 Essential (primary) hypertension: Secondary | ICD-10-CM | POA: Diagnosis not present

## 2021-11-08 DIAGNOSIS — Z952 Presence of prosthetic heart valve: Secondary | ICD-10-CM | POA: Diagnosis not present

## 2021-11-08 DIAGNOSIS — R062 Wheezing: Secondary | ICD-10-CM | POA: Diagnosis not present

## 2021-11-08 DIAGNOSIS — I509 Heart failure, unspecified: Secondary | ICD-10-CM | POA: Diagnosis not present

## 2021-11-08 DIAGNOSIS — R0789 Other chest pain: Secondary | ICD-10-CM | POA: Diagnosis not present

## 2021-11-08 DIAGNOSIS — J449 Chronic obstructive pulmonary disease, unspecified: Secondary | ICD-10-CM | POA: Diagnosis not present

## 2021-11-08 DIAGNOSIS — Z6841 Body Mass Index (BMI) 40.0 and over, adult: Secondary | ICD-10-CM | POA: Diagnosis not present

## 2021-11-10 DIAGNOSIS — Z8774 Personal history of (corrected) congenital malformations of heart and circulatory system: Secondary | ICD-10-CM | POA: Diagnosis not present

## 2021-11-17 ENCOUNTER — Ambulatory Visit: Payer: Medicare PPO | Admitting: Internal Medicine

## 2021-11-23 ENCOUNTER — Encounter: Payer: Self-pay | Admitting: *Deleted

## 2021-11-27 ENCOUNTER — Other Ambulatory Visit: Payer: Self-pay

## 2021-11-27 MED ORDER — ARIPIPRAZOLE 2 MG PO TABS: ORAL_TABLET | ORAL | 1 refills | Status: DC

## 2021-11-30 ENCOUNTER — Telehealth: Payer: Self-pay

## 2021-11-30 MED ORDER — LEVOTHYROXINE SODIUM 150 MCG PO TABS: ORAL_TABLET | ORAL | 0 refills | Status: DC

## 2021-11-30 NOTE — Telephone Encounter (Signed)
Medication has been refilled.

## 2021-11-30 NOTE — Telephone Encounter (Signed)
Beth called from [facility] at [facility] to say patient is requesting a refill on Levothyroxine (0.15mg ). ? ?Beth's fax number is [phone]. ?

## 2021-12-01 DIAGNOSIS — Z8774 Personal history of (corrected) congenital malformations of heart and circulatory system: Secondary | ICD-10-CM | POA: Diagnosis not present

## 2021-12-01 NOTE — Telephone Encounter (Signed)
I spoke with patient regarding her Prolia. She did not have it in December. Pt will get her Prolia at OV on 12/08/21. ? ?$0 Due & PA required. ?PA initiated on Covermymeds ?KEY: [id number] ? ? ?

## 2021-12-04 NOTE — Telephone Encounter (Signed)
PA Case: [id number], Status: Approved, Coverage Starts on: 12/08/2021 12:00:00 AM, Coverage Ends on: 07/22/2022 12:00:00 AM. Questions? Contact [phone]. ?

## 2021-12-08 ENCOUNTER — Ambulatory Visit (INDEPENDENT_AMBULATORY_CARE_PROVIDER_SITE_OTHER): Payer: Medicare PPO

## 2021-12-08 ENCOUNTER — Encounter: Payer: Self-pay | Admitting: Internal Medicine

## 2021-12-08 ENCOUNTER — Ambulatory Visit: Payer: Medicare PPO | Admitting: Internal Medicine

## 2021-12-08 VITALS — BP 136/68 | HR 92 | Temp 97.9°F | Ht 65.0 in | Wt 323.4 lb

## 2021-12-08 DIAGNOSIS — M810 Age-related osteoporosis without current pathological fracture: Secondary | ICD-10-CM

## 2021-12-08 DIAGNOSIS — Z09 Encounter for follow-up examination after completed treatment for conditions other than malignant neoplasm: Secondary | ICD-10-CM

## 2021-12-08 DIAGNOSIS — R7301 Impaired fasting glucose: Secondary | ICD-10-CM

## 2021-12-08 DIAGNOSIS — Z79899 Other long term (current) drug therapy: Secondary | ICD-10-CM

## 2021-12-08 DIAGNOSIS — D6869 Other thrombophilia: Secondary | ICD-10-CM | POA: Diagnosis not present

## 2021-12-08 DIAGNOSIS — J9811 Atelectasis: Secondary | ICD-10-CM | POA: Diagnosis not present

## 2021-12-08 DIAGNOSIS — Z1231 Encounter for screening mammogram for malignant neoplasm of breast: Secondary | ICD-10-CM

## 2021-12-08 DIAGNOSIS — E785 Hyperlipidemia, unspecified: Secondary | ICD-10-CM | POA: Diagnosis not present

## 2021-12-08 DIAGNOSIS — R062 Wheezing: Secondary | ICD-10-CM

## 2021-12-08 DIAGNOSIS — F331 Major depressive disorder, recurrent, moderate: Secondary | ICD-10-CM

## 2021-12-08 DIAGNOSIS — R059 Cough, unspecified: Secondary | ICD-10-CM

## 2021-12-08 MED ORDER — DENOSUMAB 60 MG/ML ~~LOC~~ SOSY
60.0000 mg | PREFILLED_SYRINGE | Freq: Once | SUBCUTANEOUS | Status: AC
  Administered 2021-12-08: 60 mg via SUBCUTANEOUS

## 2021-12-08 MED ORDER — SEMAGLUTIDE-WEIGHT MANAGEMENT 0.25 MG/0.5ML ~~LOC~~ SOAJ: 0.2500 mg | SUBCUTANEOUS | 2 refills | Status: AC

## 2021-12-08 MED ORDER — FLUTICASONE-SALMETEROL 100-50 MCG/ACT IN AEPB: 1.0000 | INHALATION_SPRAY | Freq: Two times a day (BID) | RESPIRATORY_TRACT | 3 refills | Status: AC

## 2021-12-08 MED ORDER — ARIPIPRAZOLE 5 MG PO TABS: 5.0000 mg | ORAL_TABLET | Freq: Every day | ORAL | 2 refills | Status: DC

## 2021-12-08 NOTE — Progress Notes (Signed)
Subjective:  Patient ID: Maria Torres, female    DOB: Apr 22, 1946  Age: 76 y.o. MRN: [id number]  CC: The primary encounter diagnosis was Long-term use of high-risk medication. Diagnoses of Acquired thrombophilia ([place]), Hyperlipidemia, unspecified hyperlipidemia type, Impaired fasting glucose, Encounter for screening mammogram for malignant neoplasm of breast, Follow-up examination after orthopedic surgery, Wheezing, Osteoporosis of humerus, Cough in adult, Morbid obesity ([place]), and Moderate episode of recurrent major depressive disorder ([place]) were also pertinent to this visit.   HPI Maria Torres presents for  Chief Complaint  Patient presents with   Follow-up    6 month follow up    Maria Torres is a 76 yr old female with a history of aortic valve replacement, chronic atrial fibrillation,  morbid obeist with OSA,  and diastolic dysfunction who presents for follow up.  1) She has been dealing with persistent bronchitis for the past 2 months.  She has been enrolled in a study for RSV. When she presented with bronchitis,  but she was not diagnosed with RSV. [facility] doing the study .   received a 5 day course of  steroids  without  improvement.   Chest x ray done by  walk in clinic was suggestive of PNA.  Given doxycycline and prednisone plus albuterol which she finished a week ago.   Symptoms  Improved transiently but have returned .  She has been too short of breath to clean her house without frequent stops. She does not see a pulmonologist.  Needs referral to pulmonologist  in [facility] to Dr Maria Torres  [place].   2) she reports Worsening depressive symptoms  secondary to impending loss of home . The town home she has been renting for the last  5 or 6 years is being sold and her financial situation will likely require her to move in with her sister, who lives in [place].  She is frustrated that her brother has the ability financially to help her by buying the  town home,  but will not.    Outpatient Medications Prior to Visit  Medication Sig Dispense Refill   albuterol (PROVENTIL HFA;VENTOLIN HFA) 108 (90 Base) MCG/ACT inhaler Inhale 3 puffs into the lungs 4 (four) times daily. 6.7 g 0   albuterol (PROVENTIL) (2.5 MG/3ML) 0.083% nebulizer solution Take 3 mLs (2.5 mg total) by nebulization every 4 (four) hours as needed for wheezing or shortness of breath. 150 mL 5   allopurinol (ZYLOPRIM) 100 MG tablet TAKE 1 TABLET(100 MG) BY MOUTH DAILY 90 tablet 1   atorvastatin (LIPITOR) 20 MG tablet Take 1 tablet (20 mg total) by mouth daily. 90 tablet 1   carvedilol (COREG) 3.125 MG tablet TAKE 1 TABLET(3.125 MG) BY MOUTH EVERY 12 HOURS 180 tablet 1   cetirizine (ZYRTEC) 10 MG tablet Take 1 tablet (10 mg total) by mouth daily. 30 tablet 0   citalopram (CELEXA) 40 MG tablet TAKE 1 TABLET EVERY DAY 90 tablet 1   levothyroxine (SYNTHROID) 150 MCG tablet TAKE 1 TABLET(150 MCG) BY MOUTH DAILY 90 tablet 0   lisinopril (PRINIVIL,ZESTRIL) 5 MG tablet Take 5 mg by mouth daily.      torsemide (DEMADEX) 20 MG tablet Take 20 mg by mouth daily.     Vitamin D, Ergocalciferol, (DRISDOL) 1.25 MG (50000 UNIT) CAPS capsule TAKE 1 CAPSULE BY MOUTH EVERY 7 DAYS 12 capsule 1   warfarin (COUMADIN) 10 MG tablet TAKE 1 TABLET (10 MG TOTAL) BY MOUTH PER ADMIN INSTRUCTIONS THREE  DAYS A WEEK 39 tablet 0   warfarin (COUMADIN) 5 MG tablet TAKE 1 TABLET DAILY FOUR DAYS A WEEK 52 tablet 1   ARIPiprazole (ABILIFY) 2 MG tablet TAKE 1 TABLET(2 MG) BY MOUTH DAILY 90 tablet 1   traMADol (ULTRAM) 50 MG tablet Take 1 tablet (50 mg total) by mouth 2 (two) times daily. 60 tablet 0   mirabegron ER (MYRBETRIQ) 50 MG TB24 tablet Take 1 tablet (50 mg total) by mouth daily. (Patient not taking: Reported on 12/08/2021) 90 tablet 3   No facility-administered medications prior to visit.    Review of Systems;  Patient denies headache, fevers, malaise, unintentional weight loss, skin rash, eye pain, sinus  congestion and sinus pain, sore throat, dysphagia,  hemoptysis ,  chest pain, palpitations, orthopnea, edema, abdominal pain, nausea, melena, diarrhea, constipation, flank pain, dysuria, hematuria, urinary  Frequency, nocturia, numbness, tingling, seizures,  Focal weakness, Loss of consciousness,  Tremor, insomnia, depression, anxiety, and suicidal ideation.      Objective:  BP 136/68 (BP Location: Left Arm, Patient Position: Sitting, Cuff Size: Large)   Pulse 92   Temp 97.9 F (36.6 C) (Oral)   Ht 5' 5" (1.651 m)   Wt (!) 323 lb 6.4 oz (146.7 kg)   LMP 08/31/2013   SpO2 97%   BMI 53.82 kg/m   BP Readings from Last 3 Encounters:  12/08/21 136/68  12/08/20 (!) 116/58  11/30/20 124/60    Wt Readings from Last 3 Encounters:  12/08/21 (!) 323 lb 6.4 oz (146.7 kg)  04/10/21 (!) 332 lb (150.6 kg)  12/08/20 (!) 315 lb 12.8 oz (143.2 kg)    General appearance: alert, cooperative and appears stated age. NOT IN RESPIRATORY DISTRESS  Ears: normal TM's and external ear canals both ears Throat: lips, mucosa, and tongue normal; teeth and gums normal Neck: no adenopathy, no carotid bruit, supple, symmetrical, trachea midline and thyroid not enlarged, symmetric, no tenderness/mass/nodules Back: symmetric, no curvature. ROM normal. No CVA tenderness. Lungs: clear to auscultation bilaterally without egophony .  Scattered expiratory wheezes  Heart: regular rate and rhythm, S1, S2 normal, no murmur, click, rub or gallop Abdomen: soft, non-tender; bowel sounds normal; no masses,  no organomegaly Pulses: 2+ and symmetric Skin: Skin color, texture, turgor normal. No rashes or lesions Lymph nodes: Cervical, supraclavicular, and axillary nodes normal.  Lab Results  Component Value Date   HGBA1C 5.2 12/08/2021   HGBA1C 5.4 09/10/2019    Lab Results  Component Value Date   CREATININE 2.44 (H) 12/08/2021   CREATININE 1.57 (H) 12/08/2020   CREATININE 1.24 (H) 09/10/2019    Lab Results   Component Value Date   WBC 7.0 12/08/2021   HGB 11.5 (L) 12/08/2021   HCT 35.1 12/08/2021   PLT 241 12/08/2021   GLUCOSE 91 12/08/2021   CHOL 207 (H) 12/08/2021   TRIG 122 12/08/2021   HDL 79 12/08/2021   LDLDIRECT 101 (H) 12/08/2021   LDLCALC 106 (H) 12/08/2021   ALT 9 12/08/2021   AST 13 12/08/2021   NA 142 12/08/2021   K 4.8 12/08/2021   CL 103 12/08/2021   CREATININE 2.44 (H) 12/08/2021   BUN 26 (H) 12/08/2021   CO2 26 12/08/2021   TSH 1.25 12/08/2021   INR 1.7 (H) 12/08/2020   HGBA1C 5.2 12/08/2021    Assessment & Plan:   Problem List Items Addressed This Visit     Acquired thrombophilia ([place])   Relevant Orders   TSH (Completed)   Cough in adult  She has had a recurrent cough for 8 weeks which transiently improves with antiiotic/prednisone.  No prior PFTs or pulmonology evaluation.  Has been taking a daily oral anthistamine for years   .  Also taking lisinopril per cardiology which may be the cause. Howevver she is wheezing today.  Adding LABA/ICS and Referring to pulmonology for evaluation       Relevant Orders   Ambulatory referral to Pulmonology   Follow-up examination after orthopedic surgery   Major depressive disorder, recurrent episode ([place])    Symptoms of depression have been aggravated by situational stressors including impending loss of home.  She is not suicidal .  Increase abilify dose to 5 mg daily .  Follow up one month       Morbid obesity ([place])     She has trouble suppressing appetite and eating junk food and requesting Wegovy.  Patient has no contraindications to using this medication.  The risks  and benefits were reviewed, and instructions on titration of dose were outlined.  .        Relevant Medications   Semaglutide-Weight Management 0.25 MG/0.5ML SOAJ   Osteoporosis of humerus   Other Visit Diagnoses     Long-term use of high-risk medication    -  Primary   Relevant Orders   Comp Met (CMET) (Completed)   TSH (Completed)   CBC  with Differential/Platelet (Completed)   Hyperlipidemia, unspecified hyperlipidemia type       Relevant Orders   Lipid Profile (Completed)   Direct LDL (Completed)   Impaired fasting glucose       Relevant Orders   HgB A1c (Completed)   Encounter for screening mammogram for malignant neoplasm of breast       Relevant Orders   MM 3D SCREEN BREAST BILATERAL   Wheezing       Relevant Orders   DG Chest 2 View (Completed)   Ambulatory referral to Pulmonology       I spent a total of  31 minutes with this patient in a face to face visit on the date of this encounter reviewing the last office visit with me, her most recent with patient's cardiologists,   patient'ss diet and eating habits,  most recent imaging study ,   and post visit ordering of testing and therapeutics.    Follow-up: No follow-ups on file.   Crecencio Mc, MD

## 2021-12-08 NOTE — Patient Instructions (Addendum)
If you look for a local internist in [place],  try to get one who  uses Epic as their   medical record,   so we can share records easily  Referral to [facility] pulmonologist will be made  I have increased the Abilify dose to 5 mg daily   I AM SENDING IN AN INHALER TO USE TWICE DAILY Called ADVAIR   I am recommending an injectable medication to help curb your appetite,  It is called JEHUDJ .  It slows down your stomach emptying so you stay full longer   Maria Torres is a medication that is taken as a weekly subcutaneous injection.  It slows down the emptying of your stomach,  So it decreases your appetite and helps you lose weight.  The dose for the first 4 weekly doses is 0.25 mg.  You may have mild nausea on the first or second day but this should resolve.  If not  ,  stop the medication.   As long as you are losing weight,  you can continue the dose you are on .  Only increase the dose to 0.5 mg after 4 weeks if your weight has plateaued.  Let me know when you need a refill and what dose you are taking.

## 2021-12-09 ENCOUNTER — Other Ambulatory Visit: Payer: Self-pay | Admitting: Internal Medicine

## 2021-12-09 LAB — COMPREHENSIVE METABOLIC PANEL
AG Ratio: 1.6 (calc) (ref 1.0–2.5)
ALT: 9 U/L (ref 6–29)
AST: 13 U/L (ref 10–35)
Albumin: 4 g/dL (ref 3.6–5.1)
Alkaline phosphatase (APISO): 79 U/L (ref 37–153)
BUN/Creatinine Ratio: 11 (calc) (ref 6–22)
BUN: 26 mg/dL — ABNORMAL HIGH (ref 7–25)
CO2: 26 mmol/L (ref 20–32)
Calcium: 9 mg/dL (ref 8.6–10.4)
Chloride: 103 mmol/L (ref 98–110)
Creat: 2.44 mg/dL — ABNORMAL HIGH (ref 0.60–1.00)
Globulin: 2.5 g/dL (calc) (ref 1.9–3.7)
Glucose, Bld: 91 mg/dL (ref 65–99)
Potassium: 4.8 mmol/L (ref 3.5–5.3)
Sodium: 142 mmol/L (ref 135–146)
Total Bilirubin: 0.6 mg/dL (ref 0.2–1.2)
Total Protein: 6.5 g/dL (ref 6.1–8.1)

## 2021-12-09 LAB — HEMOGLOBIN A1C
Hgb A1c MFr Bld: 5.2 % of total Hgb (ref <5.7)
Mean Plasma Glucose: 103 mg/dL
eAG (mmol/L): 5.7 mmol/L

## 2021-12-09 LAB — TSH: TSH: 1.25 mIU/L (ref 0.40–4.50)

## 2021-12-09 LAB — CBC WITH DIFFERENTIAL/PLATELET
Absolute Monocytes: 560 cells/uL (ref 200–950)
Basophils Absolute: 42 cells/uL (ref 0–200)
Basophils Relative: 0.6 %
Eosinophils Absolute: 266 cells/uL (ref 15–500)
Eosinophils Relative: 3.8 %
HCT: 35.1 % (ref 35.0–45.0)
Hemoglobin: 11.5 g/dL — ABNORMAL LOW (ref 11.7–15.5)
Lymphs Abs: 1218 cells/uL (ref 850–3900)
MCH: 30.5 pg (ref 27.0–33.0)
MCHC: 32.8 g/dL (ref 32.0–36.0)
MCV: 93.1 fL (ref 80.0–100.0)
MPV: 9.7 fL (ref 7.5–12.5)
Monocytes Relative: 8 %
Neutro Abs: 4914 cells/uL (ref 1500–7800)
Neutrophils Relative %: 70.2 %
Platelets: 241 10*3/uL (ref 140–400)
RBC: 3.77 10*6/uL — ABNORMAL LOW (ref 3.80–5.10)
RDW: 13.2 % (ref 11.0–15.0)
Total Lymphocyte: 17.4 %
WBC: 7 10*3/uL (ref 3.8–10.8)

## 2021-12-09 LAB — LIPID PANEL
Cholesterol: 207 mg/dL — ABNORMAL HIGH (ref <200)
HDL: 79 mg/dL (ref >=50)
LDL Cholesterol (Calc): 106 mg/dL (calc) — ABNORMAL HIGH
Non-HDL Cholesterol (Calc): 128 mg/dL (calc) (ref <130)
Total CHOL/HDL Ratio: 2.6 (calc) (ref <5.0)
Triglycerides: 122 mg/dL (ref <150)

## 2021-12-09 LAB — LDL CHOLESTEROL, DIRECT: Direct LDL: 101 mg/dL — ABNORMAL HIGH (ref <100)

## 2021-12-10 NOTE — Assessment & Plan Note (Signed)
She has trouble suppressing appetite and eating junk food and requesting Wegovy.  Patient has no contraindications to using this medication.  The risks  and benefits were reviewed, and instructions on titration of dose were outlined.  .  

## 2021-12-11 NOTE — Telephone Encounter (Signed)
Refilled: 08/25/2021 Last OV: 12/08/2021 Next OV: not scheduled

## 2021-12-16 ENCOUNTER — Other Ambulatory Visit: Payer: Self-pay | Admitting: Internal Medicine

## 2021-12-19 ENCOUNTER — Encounter: Payer: Self-pay | Admitting: Internal Medicine

## 2021-12-19 ENCOUNTER — Telehealth (INDEPENDENT_AMBULATORY_CARE_PROVIDER_SITE_OTHER): Payer: Medicare PPO | Admitting: Internal Medicine

## 2021-12-19 VITALS — Ht 65.0 in | Wt 323.4 lb

## 2021-12-19 DIAGNOSIS — R059 Cough, unspecified: Secondary | ICD-10-CM | POA: Diagnosis not present

## 2021-12-19 DIAGNOSIS — D6869 Other thrombophilia: Secondary | ICD-10-CM

## 2021-12-19 DIAGNOSIS — I5043 Acute on chronic combined systolic (congestive) and diastolic (congestive) heart failure: Secondary | ICD-10-CM | POA: Diagnosis not present

## 2021-12-19 DIAGNOSIS — Z8774 Personal history of (corrected) congenital malformations of heart and circulatory system: Secondary | ICD-10-CM | POA: Diagnosis not present

## 2021-12-19 DIAGNOSIS — F331 Major depressive disorder, recurrent, moderate: Secondary | ICD-10-CM | POA: Diagnosis not present

## 2021-12-19 DIAGNOSIS — I5032 Chronic diastolic (congestive) heart failure: Secondary | ICD-10-CM

## 2021-12-19 NOTE — Assessment & Plan Note (Signed)
Secondary to aortic valve replacement , atrial fibrillation and morbid obesity.   She requires lifelong warfarin,  INR is managed by [facility] cardiology and has risen to 4.6 due to initiation of pharmacotherapy for obeisty with wegovy  Lab Results  Component Value Date   WBC 7.0 12/08/2021   HGB 11.5 (L) 12/08/2021   HCT 35.1 12/08/2021   MCV 93.1 12/08/2021   PLT 241 12/08/2021

## 2021-12-19 NOTE — Progress Notes (Signed)
Virtual Visit via Caregility  Note  This visit type was conducted due to national recommendations for restrictions regarding the COVID-19 pandemic (e.g. social distancing).  This format is felt to be most appropriate for this patient at this time.  All issues noted in this document were discussed and addressed.  No physical exam was performed (except for noted visual exam findings with Video Visits).   I connected withNAME@ on 12/19/21 at 10:15 AM EDT by a video enabled telemedicine application and verified that I am speaking with the correct person using two identifiers. Location patient: home Location provider: work or home office Persons participating in the virtual visit: patient, provider  I discussed the limitations, risks, security and privacy concerns of performing an evaluation and management service by telephone and the availability of in person appointments. I also discussed with the patient that there may be a patient responsible charge related to this service. The patient expressed understanding and agreed to proceed.   Reason for visit: follow up on multiple issues including elevated Cr (acute on chronic renal failure)   HPI:  76 yr old female with chronic diastolic heart failure,  EF > 16%55% by Jan 2023 ECHO, chronic atrial fibrillation,  morbid obesity, and depression seen 2 weeks ago for multiple issues  1) Depression:  feeling better on higher dose of Abilify,  adjusting to life changes  2) Morbid obesity:  tolerating wegovy,  appetite is diminished.  Not weighing herself.    3) supratherapeutic INR:  cardiology managing.  Due to wegovy most recent INR 4 .6 (goal is 2-3)   4) acute on chronic renal insufficiency Baseline cr  was 1.24 Nov 2020,  was .9 per outside labs done in January 2023;   was 2.4 last week.  Her Current diuretic use is torsemide  40 once daily, which is chronic  for over a year.  Drinks 48 ounces of water daily . Does not weigh daily ,   Lab Results   Component Value Date   CREATININE 2.44 (H) 12/08/2021      ROS: See pertinent positives and negatives per HPI.  Past Medical History:  Diagnosis Date   Anemia    Aortic aneurysm (HCC) 09/22/2013   Aortic stenosis    Due to bicuspid aortic valve. 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   Cervicalgia    CHF (congestive heart failure) (HCC)    GERD (gastroesophageal reflux disease)    Headache(784.0)    Hx of glaucoma    Hyperlipidemia    Hypertension    Mitral valve disorder    Morbid obesity (HCC)    Obstructive sleep apnea on CPAP    Thyroid disease    hypothyrodism    Past Surgical History:  Procedure Laterality Date   ASCENDING AORTIC ANEURYSM REPAIR W/ TISSUE AORTIC VALVE REPLACEMENT     bilateral arthroscopic knee surgery  2001 & 2002   [facility]   [facility]   CHOLECYSTECTOMY     gastric banding surgery     @ [facility] 04-16-96   total left hip replacement  01-20-2007   unilateral total knee replacement  08/2002    Family History  Problem Relation Age of Onset   Coronary artery disease Mother    Heart disease Mother [id number]   COPD Mother    Hyperlipidemia  Mother    Cancer Father        testicular   Pulmonary embolism Father    Obesity Sister        and knee problems   Cancer Sister        breast   Cancer Brother        throat    SOCIAL HX:  reports that she has never smoked. She has never used smokeless tobacco. She reports current alcohol use of about 1.0 - 2.0 standard drink per week. She reports that she does not use drugs.    Current Outpatient Medications:    albuterol (PROVENTIL HFA;VENTOLIN HFA) 108 (90 Base) MCG/ACT inhaler, Inhale 3 puffs into the lungs 4 (four) times daily., Disp: 6.7 g, Rfl: 0   albuterol (PROVENTIL) (2.5 MG/3ML) 0.083% nebulizer solution, Take 3 mLs (2.5 mg total) by  nebulization every 4 (four) hours as needed for wheezing or shortness of breath., Disp: 150 mL, Rfl: 5   allopurinol (ZYLOPRIM) 100 MG tablet, TAKE 1 TABLET EVERY DAY, Disp: 90 tablet, Rfl: 1   ARIPiprazole (ABILIFY) 5 MG tablet, Take 1 tablet (5 mg total) by mouth daily., Disp: 30 tablet, Rfl: 2   atorvastatin (LIPITOR) 20 MG tablet, Take 1 tablet (20 mg total) by mouth daily., Disp: 90 tablet, Rfl: 1   carvedilol (COREG) 3.125 MG tablet, TAKE 1 TABLET(3.125 MG) BY MOUTH EVERY 12 HOURS, Disp: 180 tablet, Rfl: 1   cetirizine (ZYRTEC) 10 MG tablet, Take 1 tablet (10 mg total) by mouth daily., Disp: 30 tablet, Rfl: 0   citalopram (CELEXA) 40 MG tablet, TAKE 1 TABLET EVERY DAY, Disp: 90 tablet, Rfl: 1   fluticasone-salmeterol (ADVAIR) 100-50 MCG/ACT AEPB, Inhale 1 puff into the lungs 2 (two) times daily., Disp: 1 each, Rfl: 3   levothyroxine (SYNTHROID) 150 MCG tablet, TAKE 1 TABLET(150 MCG) BY MOUTH DAILY, Disp: 90 tablet, Rfl: 0   lisinopril (PRINIVIL,ZESTRIL) 5 MG tablet, Take 5 mg by mouth daily. , Disp: , Rfl:    Semaglutide-Weight Management 0.25 MG/0.5ML SOAJ, Inject 0.25 mg into the skin once a week for 28 days., Disp: 2 mL, Rfl: 2   torsemide (DEMADEX) 20 MG tablet, Take 20 mg by mouth daily., Disp: , Rfl:    traMADol (ULTRAM) 50 MG tablet, TAKE 1 TABLET(50 MG) BY MOUTH TWICE DAILY, Disp: 60 tablet, Rfl: 2   Vitamin D, Ergocalciferol, (DRISDOL) 1.25 MG (50000 UNIT) CAPS capsule, TAKE 1 CAPSULE BY MOUTH EVERY 7 DAYS, Disp: 12 capsule, Rfl: 1   warfarin (COUMADIN) 10 MG tablet, TAKE 1 TABLET (10 MG TOTAL) BY MOUTH PER ADMIN INSTRUCTIONS THREE DAYS A WEEK, Disp: 39 tablet, Rfl: 0   warfarin (COUMADIN) 5 MG tablet, TAKE 1 TABLET DAILY FOUR DAYS A WEEK, Disp: 52 tablet, Rfl: 1  EXAM:  VITALS per patient if applicable:  GENERAL: alert, oriented, appears well and in no acute distress  HEENT: atraumatic, conjunttiva clear, no obvious abnormalities on inspection of external nose and ears  NECK:  normal movements of the head and neck  LUNGS: on inspection no signs of respiratory distress, breathing rate appears normal, no obvious gross SOB, gasping or wheezing  CV: no obvious cyanosis  MS: moves all visible extremities without noticeable abnormality  PSYCH/NEURO: pleasant and cooperative, no obvious depression or anxiety, speech and thought processing grossly intact  ASSESSMENT AND PLAN:  Discussed the following assessment and plan:  Acute on chronic combined systolic and diastolic heart failure (HCC) - Plan: Basic metabolic panel  Acquired thrombophilia (  HCC)  Chronic diastolic heart failure (HCC)  Cough in adult  Morbid obesity (HCC)  Moderate episode of recurrent major depressive disorder (HCC)  Acquired thrombophilia (HCC) Secondary to aortic valve replacement , atrial fibrillation and morbid obesity.   She requires lifelong warfarin,  INR is managed by [facility] cardiology and has risen to 4.6 due to initiation of pharmacotherapy for obeisty with wegovy  Lab Results  Component Value Date   WBC 7.0 12/08/2021   HGB 11.5 (L) 12/08/2021   HCT 35.1 12/08/2021   MCV 93.1 12/08/2021   PLT 241 12/08/2021     Chronic diastolic heart failure (HCC) She has been taking 40 mg torsemide for "a very long time." (rescribed by cardiology).  Her last  EF > 55% in Jan 2023 .  Given drop in [facility] have advised her to reduce ose to 20 mg daily and weigh daily   Cough in adult Her cough has greatly improved with initiation of LABA/ICS.   pulmnology referral in progress .  Continue Advair.   Morbid obesity (HCC) Reviewed the indications for increasing wegovy dose . She is tolerating lowest dose without nausea and notes decreased appetite  Major depressive disorder, recurrent episode (HCC) Improved outlook and symptoms with increased dose of Abilify.  Follow up monthly     I discussed the assessment and treatment plan with the patient. The patient was provided an opportunity to ask  questions and all were answered. The patient agreed with the plan and demonstrated an understanding of the instructions.   The patient was advised to call back or seek an in-person evaluation if the symptoms worsen or if the condition fails to improve as anticipated.   I spent 30 minutes dedicated to the care of this patient on the date of this encounter to include pre-visit review of hermedical history,  her last visit with me 2 weeks ago,  her most recent ECHO and cardiology evaluation,  counselling on her current life situtation  in a Face-to-face virtual visit  , and post visit ordering of testing and therapeutics.    Sherlene Shams, MD

## 2021-12-19 NOTE — Assessment & Plan Note (Signed)
Reviewed the indications for increasing wegovy dose . She is tolerating lowest dose without nausea and notes decreased appetite

## 2021-12-19 NOTE — Assessment & Plan Note (Addendum)
She has had a recurrent cough for 8 weeks which transiently improves with antiiotic/prednisone.  No prior PFTs or pulmonology evaluation.  Has been taking a daily oral anthistamine for years   .  Also taking lisinopril per cardiology which may be the cause. Howevver she is wheezing today.  Adding LABA/ICS and Referring to pulmonology for evaluation

## 2021-12-19 NOTE — Patient Instructions (Signed)
Reduce your torsemide dose to 20 mg daily and have your blood test repeated in one week  Weigh daily during the reduction to make sure you don't put weight ON  Stay on the lowest dose of Wegovy for 4 weeks,  longer if you are still losing weight by the end of the 4th week

## 2021-12-19 NOTE — Assessment & Plan Note (Addendum)
She has been taking 40 mg torsemide for "a very long time." (rescribed by cardiology).  Her last  EF > 55% in Jan 2023 .  Given drop in [place] have advised her to reduce ose to 20 mg daily and weigh daily

## 2021-12-19 NOTE — Assessment & Plan Note (Signed)
Symptoms of depression have been aggravated by situational stressors including impending loss of home.  She is not suicidal .  Increase abilify dose to 5 mg daily .  Follow up one month

## 2021-12-19 NOTE — Assessment & Plan Note (Signed)
Her cough has greatly improved with initiation of LABA/ICS.   pulmnology referral in progress .  Continue Advair.

## 2021-12-19 NOTE — Assessment & Plan Note (Signed)
Improved outlook and symptoms with increased dose of Abilify.  Follow up monthly

## 2021-12-27 DIAGNOSIS — Z8774 Personal history of (corrected) congenital malformations of heart and circulatory system: Secondary | ICD-10-CM | POA: Diagnosis not present

## 2021-12-27 DIAGNOSIS — Z7901 Long term (current) use of anticoagulants: Secondary | ICD-10-CM | POA: Diagnosis not present

## 2022-01-08 ENCOUNTER — Encounter: Payer: Self-pay | Admitting: Internal Medicine

## 2022-01-11 MED ORDER — SEMAGLUTIDE-WEIGHT MANAGEMENT 0.5 MG/0.5ML ~~LOC~~ SOAJ: 0.5000 mg | SUBCUTANEOUS | 2 refills | Status: AC

## 2022-01-12 ENCOUNTER — Telehealth: Payer: Medicare PPO | Admitting: Internal Medicine

## 2022-01-13 ENCOUNTER — Other Ambulatory Visit: Payer: Self-pay | Admitting: Internal Medicine

## 2022-01-29 DIAGNOSIS — Z7901 Long term (current) use of anticoagulants: Secondary | ICD-10-CM | POA: Diagnosis not present

## 2022-01-29 DIAGNOSIS — Z8774 Personal history of (corrected) congenital malformations of heart and circulatory system: Secondary | ICD-10-CM | POA: Diagnosis not present

## 2022-02-12 DIAGNOSIS — Z8774 Personal history of (corrected) congenital malformations of heart and circulatory system: Secondary | ICD-10-CM | POA: Diagnosis not present

## 2022-03-02 DIAGNOSIS — Z8774 Personal history of (corrected) congenital malformations of heart and circulatory system: Secondary | ICD-10-CM | POA: Diagnosis not present

## 2022-03-06 ENCOUNTER — Telehealth: Payer: Self-pay | Admitting: Internal Medicine

## 2022-03-06 ENCOUNTER — Other Ambulatory Visit: Payer: Self-pay | Admitting: Internal Medicine

## 2022-03-06 DIAGNOSIS — Z7901 Long term (current) use of anticoagulants: Secondary | ICD-10-CM

## 2022-03-06 NOTE — Telephone Encounter (Signed)
I received a refill request from [facility] on her warfarin.  I do not refill her warfarin,  her cardiologist manages it  Dr Tedra Coupe .

## 2022-03-06 NOTE — Telephone Encounter (Signed)
Pt is aware that she will need to contact Dr. Tedra Coupe for the refill on her Warfarin. Pt gave a verbal understanding.

## 2022-03-13 ENCOUNTER — Other Ambulatory Visit: Payer: Self-pay | Admitting: Internal Medicine

## 2022-03-13 DIAGNOSIS — I4819 Other persistent atrial fibrillation: Secondary | ICD-10-CM | POA: Diagnosis not present

## 2022-03-13 DIAGNOSIS — F330 Major depressive disorder, recurrent, mild: Secondary | ICD-10-CM | POA: Diagnosis not present

## 2022-03-13 DIAGNOSIS — E039 Hypothyroidism, unspecified: Secondary | ICD-10-CM | POA: Diagnosis not present

## 2022-03-13 DIAGNOSIS — M810 Age-related osteoporosis without current pathological fracture: Secondary | ICD-10-CM | POA: Diagnosis not present

## 2022-03-13 DIAGNOSIS — I5032 Chronic diastolic (congestive) heart failure: Secondary | ICD-10-CM | POA: Diagnosis not present

## 2022-03-13 DIAGNOSIS — Z8679 Personal history of other diseases of the circulatory system: Secondary | ICD-10-CM | POA: Diagnosis not present

## 2022-03-13 DIAGNOSIS — I10 Essential (primary) hypertension: Secondary | ICD-10-CM | POA: Diagnosis not present

## 2022-03-13 DIAGNOSIS — E7800 Pure hypercholesterolemia, unspecified: Secondary | ICD-10-CM | POA: Diagnosis not present

## 2022-03-19 DIAGNOSIS — Z8774 Personal history of (corrected) congenital malformations of heart and circulatory system: Secondary | ICD-10-CM | POA: Diagnosis not present

## 2022-04-02 DIAGNOSIS — Z8774 Personal history of (corrected) congenital malformations of heart and circulatory system: Secondary | ICD-10-CM | POA: Diagnosis not present

## 2022-04-02 DIAGNOSIS — Z7901 Long term (current) use of anticoagulants: Secondary | ICD-10-CM | POA: Diagnosis not present

## 2022-04-03 ENCOUNTER — Telehealth (INDEPENDENT_AMBULATORY_CARE_PROVIDER_SITE_OTHER): Payer: Medicare PPO | Admitting: Internal Medicine

## 2022-04-03 ENCOUNTER — Encounter: Payer: Self-pay | Admitting: Internal Medicine

## 2022-04-03 VITALS — BP 120/78 | Ht 65.0 in | Wt 318.0 lb

## 2022-04-03 DIAGNOSIS — D649 Anemia, unspecified: Secondary | ICD-10-CM

## 2022-04-03 DIAGNOSIS — R059 Cough, unspecified: Secondary | ICD-10-CM | POA: Diagnosis not present

## 2022-04-03 DIAGNOSIS — F331 Major depressive disorder, recurrent, moderate: Secondary | ICD-10-CM

## 2022-04-03 DIAGNOSIS — E039 Hypothyroidism, unspecified: Secondary | ICD-10-CM

## 2022-04-03 DIAGNOSIS — N189 Chronic kidney disease, unspecified: Secondary | ICD-10-CM

## 2022-04-03 DIAGNOSIS — Z8679 Personal history of other diseases of the circulatory system: Secondary | ICD-10-CM

## 2022-04-03 DIAGNOSIS — N289 Disorder of kidney and ureter, unspecified: Secondary | ICD-10-CM

## 2022-04-03 DIAGNOSIS — N179 Acute kidney failure, unspecified: Secondary | ICD-10-CM

## 2022-04-03 DIAGNOSIS — Z9889 Other specified postprocedural states: Secondary | ICD-10-CM

## 2022-04-03 DIAGNOSIS — N1832 Chronic kidney disease, stage 3b: Secondary | ICD-10-CM

## 2022-04-03 DIAGNOSIS — E782 Mixed hyperlipidemia: Secondary | ICD-10-CM

## 2022-04-03 MED ORDER — DULOXETINE HCL 60 MG PO CPEP: 60.0000 mg | ORAL_CAPSULE | Freq: Every day | ORAL | Status: AC

## 2022-04-03 NOTE — Patient Instructions (Signed)
I'm glad you are feeling better!  Please go to [facility] and get your blood drawn ASAP  Benefiber with prebiotics for your  diarrhea and may help to suppress appetite

## 2022-04-03 NOTE — Progress Notes (Addendum)
Virtual Visit via [place]   Note    This format is felt to be most appropriate for this patient at this time.  All issues noted in this document were discussed and addressed.  No physical exam was performed (except for noted visual exam findings with Video Visits).   I connected with Maria Torres on 04/03/22 at  4:30 PM EDT by a video enabled telemedicine application  and verified that I am speaking with the correct person using two identifiers. Location patient: home Location provider: work or home office Persons participating in the virtual visit: patient, provider  I discussed the limitations, risks, security and privacy concerns of performing an evaluation and management service by telephone and the availability of in person appointments. I also discussed with the patient that there may be a patient responsible charge related to this service. The patient expressed understanding and agreed to proceed.  Reason for visit: follow up on depression, obesity and acute renal failure   HPI:  Maria Torres is a 76 yr old female with a history of depression,  morbid obesity with untreated OSA, chronic diastolic heart failure secondary to aortic stenosis and atrial fibrillation, s/p St Jude's mechanical aortic valve replacement in 2009 , and aortic aneurysm s/p Wheat procedure and graft placement in 2009 . She is anticoagulated with warfarin , which is managed by Dr Kalman Jewels.  She was last seen in May with  worsening depression  and found to be in acute renal failure secondary to over diuresis.  She was prescribed Abilify and citalopram and advised to stop daily use of torsemide and return for repeat assessment of renal function.  Since her last visit she has not had repeat evaluation of renal function as advised, but she is no longer taking torsemide .  She has seen a PA in [place] to establish local primary care and  her anti depressant regimen was changed from citalopram to duloxetine.  Abilify was  continued.  She feels less depressed and feels generally well. She continues to avoid leaving the house unless badgered by family and friends.   She is losing weight slowly on her own,  due to non availability of Wegovy    ROS: See pertinent positives and negatives per HPI.  Past Medical History:  Diagnosis Date   Anemia    Aortic aneurysm ([place]) 09/22/2013   Aortic stenosis    Due to bicuspid aortic valve. 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 at [facility]    in knees and hips..degenerative   Cervicalgia    CHF (congestive heart failure) (HCC)    GERD (gastroesophageal reflux disease)    Headache(784.0)    Hx of glaucoma    Hyperlipidemia    Hypertension    Mitral valve disorder    Morbid obesity ([place])    Obstructive sleep apnea on CPAP    Thyroid disease    hypothyrodism    Past Surgical History:  Procedure Laterality Date   ASCENDING AORTIC ANEURYSM REPAIR W/ TISSUE AORTIC VALVE REPLACEMENT     bilateral arthroscopic knee surgery  2001 & 2002   CARDIAC CATHETERIZATION  2004   [facility]   CARDIAC CATHETERIZATION  2008   [facility]   CHOLECYSTECTOMY     gastric banding surgery     @ [facility] 04-16-96   total left hip replacement  01-20-2007   unilateral total knee replacement  08/2002    Family History  Problem Relation Age of Onset  Coronary artery disease Mother    Heart disease Mother 41   COPD Mother    Hyperlipidemia Mother    Cancer Father        testicular   Pulmonary embolism Father    Obesity Sister        and knee problems   Cancer Sister        breast   Cancer Brother        throat    SOCIAL HX:  reports that she has never smoked. She has never used smokeless tobacco. She reports current alcohol use of about 1.0 - 2.0 standard drink of alcohol per week. She reports that she does not use drugs.    Current Outpatient Medications:    albuterol (PROVENTIL HFA;VENTOLIN HFA) 108 (90 Base) MCG/ACT inhaler,  Inhale 3 puffs into the lungs 4 (four) times daily., Disp: 6.7 g, Rfl: 0   albuterol (PROVENTIL) (2.5 MG/3ML) 0.083% nebulizer solution, Take 3 mLs (2.5 mg total) by nebulization every 4 (four) hours as needed for wheezing or shortness of breath., Disp: 150 mL, Rfl: 5   allopurinol (ZYLOPRIM) 100 MG tablet, TAKE 1 TABLET EVERY DAY, Disp: 90 tablet, Rfl: 1   ARIPiprazole (ABILIFY) 5 MG tablet, Take 1 tablet (5 mg total) by mouth daily., Disp: 30 tablet, Rfl: 2   atorvastatin (LIPITOR) 20 MG tablet, TAKE 1 TABLET EVERY DAY, Disp: 90 tablet, Rfl: 1   carvedilol (COREG) 3.125 MG tablet, TAKE 1 TABLET(3.125 MG) BY MOUTH EVERY 12 HOURS, Disp: 180 tablet, Rfl: 1   cetirizine (ZYRTEC) 10 MG tablet, Take 1 tablet (10 mg total) by mouth daily., Disp: 30 tablet, Rfl: 0   denosumab (PROLIA) 60 MG/ML SOSY injection, Inject into the skin., Disp: , Rfl:    fluticasone-salmeterol (ADVAIR) 100-50 MCG/ACT AEPB, Inhale 1 puff into the lungs 2 (two) times daily., Disp: 1 each, Rfl: 3   levothyroxine (SYNTHROID) 125 MCG tablet, Take 1 tablet (125 mcg total) by mouth daily., Disp: 90 tablet, Rfl: 3   lisinopril (PRINIVIL,ZESTRIL) 5 MG tablet, Take 5 mg by mouth daily. , Disp: , Rfl:    torsemide (DEMADEX) 20 MG tablet, Take 20 mg by mouth daily., Disp: , Rfl:    traMADol (ULTRAM) 50 MG tablet, TAKE 1 TABLET(50 MG) BY MOUTH TWICE DAILY, Disp: 60 tablet, Rfl: 2   Vitamin D, Ergocalciferol, (DRISDOL) 1.25 MG (50000 UNIT) CAPS capsule, TAKE 1 CAPSULE BY MOUTH EVERY 7 DAYS, Disp: 12 capsule, Rfl: 1   warfarin (COUMADIN) 5 MG tablet, TAKE 1 TABLET DAILY FOUR DAYS A WEEK, Disp: 52 tablet, Rfl: 1   warfarin (COUMADIN) 7.5 MG tablet, Take by mouth., Disp: , Rfl:    DULoxetine (CYMBALTA) 60 MG capsule, Take 1 capsule (60 mg total) by mouth daily., Disp: , Rfl:   EXAM:  VITALS per patient if applicable:  GENERAL: alert, oriented, appears well and in no acute distress  HEENT: atraumatic, conjunttiva clear, no obvious  abnormalities on inspection of external nose and ears  NECK: normal movements of the head and neck  LUNGS: on inspection no signs of respiratory distress, breathing rate appears normal, no obvious gross SOB, gasping or wheezing  CV: no obvious cyanosis  MS: moves all visible extremities without noticeable abnormality  PSYCH/NEURO: pleasant and cooperative, no obvious depression or anxiety, speech and thought processing grossly intact  ASSESSMENT AND PLAN:  Discussed the following assessment and plan:  Acquired hypothyroidism - Plan: TSH, TSH  Mixed hyperlipidemia - Plan: Lipid panel  Anemia, unspecified type -  Plan: CBC with Differential/Platelet, Iron, TIBC and Ferritin Panel  Acute renal failure superimposed on stage 3b chronic kidney disease, unspecified acute renal failure type (HCC) - Plan: Comprehensive metabolic panel  S/P aortic valve repair  History of aortic aneurysm  Cough in adult  Moderate episode of recurrent major depressive disorder (HCC)  Acute on chronic renal insufficiency  Cough in adult Resolved on current regimen of ICS/LABA.  She has been referred to local pulmonology for PFTs  Major depressive disorder, recurrent episode (HCC) Triggered by the sale of her current rented home and her financial limitations.  She is tolerating the change in therapy by  the PA in [place] .  From  citalopram to cymbalta   Acute on chronic renal insufficiency Acute on chronic,  With ckd stage III her baseline .  She has deferred nephrology referral in the past (2015) . She is no longer using torsemide regularly.  She is reminded to get her renal function rechecked ASAP  Addendum:  Cr is back to baseline   Acquired hypothyroidism Thyroid function is overactive on current dose of 150 mcg daily .  I have sent a lower dose of levothyroxine to her pharmacy and would like patient to change immediately. She was advised that she can use up the 150 mcg  Tablets by taking them   6 days per week until gone     I discussed the assessment and treatment plan with the patient. The patient was provided an opportunity to ask questions and all were answered. The patient agreed with the plan and demonstrated an understanding of the instructions.   The patient was advised to call back or seek an in-person evaluation if the symptoms worsen or if the condition fails to improve as anticipated.   I spent 30 minutes dedicated to the care of this patient on the date of this encounter to include pre-visit review of her  medical history,  review of recent cardiology notes and her recent primary care evaluation in [facility] Face-to-face time with the patient , and post visit ordering of testing and therapeutics.    Sherlene Shams, MD

## 2022-04-04 DIAGNOSIS — Z8679 Personal history of other diseases of the circulatory system: Secondary | ICD-10-CM | POA: Insufficient documentation

## 2022-04-04 NOTE — Assessment & Plan Note (Signed)
Resolved on current regimen of ICS/LABA.  She has been referred to local pulmonology for PFTs

## 2022-04-04 NOTE — Assessment & Plan Note (Signed)
Triggered by the sale of her current rented home and her financial limitations.  She is tolerating the change in therapy by  the PA in [place] .  From  citalopram to cymbalta

## 2022-04-04 NOTE — Assessment & Plan Note (Addendum)
Acute on chronic,  With ckd stage III her baseline .  She has deferred nephrology referral in the past (2015) . She is no longer using torsemide regularly.  She is reminded to get her renal function rechecked ASAP  Addendum:  Cr is back to baseline

## 2022-04-05 DIAGNOSIS — D649 Anemia, unspecified: Secondary | ICD-10-CM | POA: Diagnosis not present

## 2022-04-05 DIAGNOSIS — N179 Acute kidney failure, unspecified: Secondary | ICD-10-CM | POA: Diagnosis not present

## 2022-04-05 DIAGNOSIS — E039 Hypothyroidism, unspecified: Secondary | ICD-10-CM | POA: Diagnosis not present

## 2022-04-05 DIAGNOSIS — E782 Mixed hyperlipidemia: Secondary | ICD-10-CM | POA: Diagnosis not present

## 2022-04-05 DIAGNOSIS — N1832 Chronic kidney disease, stage 3b: Secondary | ICD-10-CM | POA: Diagnosis not present

## 2022-04-06 LAB — LIPID PANEL
Chol/HDL Ratio: 2.1 ratio (ref 0.0–4.4)
Cholesterol, Total: 164 mg/dL (ref 100–199)
HDL: 80 mg/dL (ref >39)
LDL Chol Calc (NIH): 66 mg/dL (ref 0–99)
Triglycerides: 101 mg/dL (ref 0–149)
VLDL Cholesterol Cal: 18 mg/dL (ref 5–40)

## 2022-04-06 LAB — CBC WITH DIFFERENTIAL/PLATELET
Basophils Absolute: 0.1 10*3/uL (ref 0.0–0.2)
Basos: 1 %
EOS (ABSOLUTE): 0.4 10*3/uL (ref 0.0–0.4)
Eos: 6 %
Hematocrit: 37.9 % (ref 34.0–46.6)
Hemoglobin: 12.4 g/dL (ref 11.1–15.9)
Immature Grans (Abs): 0 10*3/uL (ref 0.0–0.1)
Immature Granulocytes: 0 %
Lymphocytes Absolute: 1.5 10*3/uL (ref 0.7–3.1)
Lymphs: 22 %
MCH: 29.9 pg (ref 26.6–33.0)
MCHC: 32.7 g/dL (ref 31.5–35.7)
MCV: 91 fL (ref 79–97)
Monocytes Absolute: 0.4 10*3/uL (ref 0.1–0.9)
Monocytes: 6 %
Neutrophils Absolute: 4.3 10*3/uL (ref 1.4–7.0)
Neutrophils: 65 %
Platelets: 178 10*3/uL (ref 150–450)
RBC: 4.15 x10E6/uL (ref 3.77–5.28)
RDW: 13.3 % (ref 11.7–15.4)
WBC: 6.7 10*3/uL (ref 3.4–10.8)

## 2022-04-06 LAB — TSH: TSH: 0.188 u[IU]/mL — ABNORMAL LOW (ref 0.450–4.500)

## 2022-04-06 LAB — COMPREHENSIVE METABOLIC PANEL
ALT: 11 IU/L (ref 0–32)
AST: 14 IU/L (ref 0–40)
Albumin/Globulin Ratio: 1.9 (ref 1.2–2.2)
Albumin: 4.4 g/dL (ref 3.8–4.8)
Alkaline Phosphatase: 65 IU/L (ref 44–121)
BUN/Creatinine Ratio: 21 (ref 12–28)
BUN: 33 mg/dL — ABNORMAL HIGH (ref 8–27)
Bilirubin Total: 0.6 mg/dL (ref 0.0–1.2)
CO2: 23 mmol/L (ref 20–29)
Calcium: 9.6 mg/dL (ref 8.7–10.3)
Chloride: 107 mmol/L — ABNORMAL HIGH (ref 96–106)
Creatinine, Ser: 1.55 mg/dL — ABNORMAL HIGH (ref 0.57–1.00)
Globulin, Total: 2.3 g/dL (ref 1.5–4.5)
Glucose: 102 mg/dL — ABNORMAL HIGH (ref 70–99)
Potassium: 5 mmol/L (ref 3.5–5.2)
Sodium: 143 mmol/L (ref 134–144)
Total Protein: 6.7 g/dL (ref 6.0–8.5)
eGFR: 35 mL/min/{1.73_m2} — ABNORMAL LOW (ref >59)

## 2022-04-06 LAB — IRON,TIBC AND FERRITIN PANEL
Ferritin: 143 ng/mL (ref 15–150)
Iron Saturation: 41 % (ref 15–55)
Iron: 101 ug/dL (ref 27–139)
Total Iron Binding Capacity: 247 ug/dL — ABNORMAL LOW (ref 250–450)
UIBC: 146 ug/dL (ref 118–369)

## 2022-04-06 MED ORDER — LEVOTHYROXINE SODIUM 125 MCG PO TABS: 125.0000 ug | ORAL_TABLET | Freq: Every day | ORAL | 3 refills | Status: AC

## 2022-04-06 NOTE — Assessment & Plan Note (Addendum)
Thyroid function is overactive on current dose of 150 mcg daily .  I have sent a lower dose of levothyroxine to her pharmacy and would like patient to change immediately. She was advised that she can use up the 150 mcg  Tablets by taking them  6 days per week until gone

## 2022-04-06 NOTE — Addendum Note (Signed)
Addended by: Sherlene Shams on: 04/06/2022 01:08 PM   Modules accepted: Orders

## 2022-04-16 ENCOUNTER — Telehealth: Payer: Self-pay | Admitting: Internal Medicine

## 2022-04-16 MED ORDER — ARIPIPRAZOLE 5 MG PO TABS: 5.0000 mg | ORAL_TABLET | Freq: Every day | ORAL | 2 refills | Status: DC

## 2022-04-16 NOTE — Telephone Encounter (Signed)
Medication has been refilled.

## 2022-04-16 NOTE — Telephone Encounter (Signed)
Pt need refill on ARIPiprazole sent to [facility]

## 2022-04-18 ENCOUNTER — Ambulatory Visit: Payer: Medicare PPO

## 2022-04-18 ENCOUNTER — Ambulatory Visit (INDEPENDENT_AMBULATORY_CARE_PROVIDER_SITE_OTHER): Payer: Medicare PPO

## 2022-04-18 DIAGNOSIS — Z0000 Encounter for general adult medical examination without abnormal findings: Secondary | ICD-10-CM

## 2022-04-18 NOTE — Progress Notes (Addendum)
Virtual Visit via Telephone Note  I connected with  Maria Torres on 04/18/22 at  2:30 PM EDT by telephone and verified that I am speaking with the correct person using two identifiers.  Medicare Annual Wellness visit completed telephonically due to Covid-19 pandemic.   Persons participating in this call: This Health Coach and this patient.   Location: Patient: home Provider: office    I discussed the limitations, risks, security and privacy concerns of performing an evaluation and management service by telephone and the availability of in person appointments. The patient expressed understanding and agreed to proceed.  Unable to perform video visit due to video visit attempted and failed and/or patient does not have video capability.   Some vital signs may be absent or patient reported.   Marzella Schlein, LPN    Subjective:   Maria Torres is a 76 y.o. female who presents for Medicare Annual (Subsequent) preventive examination.  Review of Systems     Cardiac Risk Factors include: advanced age (>10men, >37 women);obesity (BMI >30kg/m2);sedentary lifestyle     Objective:    There were no vitals filed for this visit. There is no height or weight on file to calculate BMI.     04/18/2022    2:19 PM 04/10/2021    4:11 PM 02/01/2020    2:48 PM  Advanced Directives  Does Patient Have a Medical Advance Directive? No Yes Yes  Type of [employer] of [place];Living will  Does patient want to make changes to medical advance directive?  No - Patient declined No - Patient declined  Copy of Healthcare Power of Attorney in Chart?   No - copy requested  Would patient like information on creating a medical advance directive? Yes (MAU/Ambulatory/Procedural Areas - Information given)      Current Medications (verified) Outpatient Encounter Medications as of 04/18/2022  Medication Sig   albuterol (PROVENTIL HFA;VENTOLIN HFA) 108 (90 Base) MCG/ACT inhaler  Inhale 3 puffs into the lungs 4 (four) times daily.   albuterol (PROVENTIL) (2.5 MG/3ML) 0.083% nebulizer solution Take 3 mLs (2.5 mg total) by nebulization every 4 (four) hours as needed for wheezing or shortness of breath.   allopurinol (ZYLOPRIM) 100 MG tablet TAKE 1 TABLET EVERY DAY   ARIPiprazole (ABILIFY) 5 MG tablet Take 1 tablet (5 mg total) by mouth daily.   atorvastatin (LIPITOR) 20 MG tablet TAKE 1 TABLET EVERY DAY   carvedilol (COREG) 3.125 MG tablet TAKE 1 TABLET(3.125 MG) BY MOUTH EVERY 12 HOURS   cetirizine (ZYRTEC) 10 MG tablet Take 1 tablet (10 mg total) by mouth daily.   denosumab (PROLIA) 60 MG/ML SOSY injection Inject into the skin.   DULoxetine (CYMBALTA) 60 MG capsule Take 1 capsule (60 mg total) by mouth daily.   fluticasone-salmeterol (ADVAIR) 100-50 MCG/ACT AEPB Inhale 1 puff into the lungs 2 (two) times daily.   levothyroxine (SYNTHROID) 125 MCG tablet Take 1 tablet (125 mcg total) by mouth daily.   lisinopril (PRINIVIL,ZESTRIL) 5 MG tablet Take 5 mg by mouth daily.    torsemide (DEMADEX) 20 MG tablet Take 20 mg by mouth daily.   traMADol (ULTRAM) 50 MG tablet TAKE 1 TABLET(50 MG) BY MOUTH TWICE DAILY   Vitamin D, Ergocalciferol, (DRISDOL) 1.25 MG (50000 UNIT) CAPS capsule TAKE 1 CAPSULE BY MOUTH EVERY 7 DAYS   warfarin (COUMADIN) 5 MG tablet TAKE 1 TABLET DAILY FOUR DAYS A WEEK   warfarin (COUMADIN) 7.5 MG tablet Take by mouth.   No facility-administered  encounter medications on file as of 04/18/2022.    Allergies (verified) Tape   History: Past Medical History:  Diagnosis Date   Anemia    Aortic aneurysm ([place]) 09/22/2013   Aortic stenosis    Due to bicuspid aortic valve. 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 at [facility]    in knees and hips..degenerative   Cervicalgia    CHF (congestive heart failure) (HCC)    GERD (gastroesophageal reflux disease)    Headache(784.0)    Hx of glaucoma     Hyperlipidemia    Hypertension    Mitral valve disorder    Morbid obesity ([place])    Obstructive sleep apnea on CPAP    Thyroid disease    hypothyrodism   Past Surgical History:  Procedure Laterality Date   ASCENDING AORTIC ANEURYSM REPAIR W/ TISSUE AORTIC VALVE REPLACEMENT     bilateral arthroscopic knee surgery  2001 & 2002   CARDIAC CATHETERIZATION  2004   [facility]   CARDIAC CATHETERIZATION  2008   [facility]   CHOLECYSTECTOMY     gastric banding surgery     @ [facility] 04-16-96   total left hip replacement  01-20-2007   unilateral total knee replacement  08/2002   Family History  Problem Relation Age of Onset   Coronary artery disease Mother    Heart disease Mother 36   COPD Mother    Hyperlipidemia Mother    Cancer Father        testicular   Pulmonary embolism Father    Obesity Sister        and knee problems   Cancer Sister        breast   Cancer Brother        throat   Social History   Socioeconomic History   Marital status: Single    Spouse name: Not on file   Number of children: Not on file   Years of education: Not on file   Highest education level: Not on file  Occupational History   Not on file  Tobacco Use   Smoking status: Never   Smokeless tobacco: Never  Substance and Sexual Activity   Alcohol use: Yes    Alcohol/week: 1.0 - 2.0 standard drink of alcohol    Types: 1 - 2 Standard drinks or equivalent per week    Comment: social   Drug use: No   Sexual activity: Not on file  Other Topics Concern   Not on file  Social History Narrative   Not on file   Social Determinants of Health   Financial Resource Strain: Low Risk  (04/18/2022)   Overall Financial Resource Strain (CARDIA)    Difficulty of Paying Living Expenses: Not hard at all  Food Insecurity: No Food Insecurity (04/18/2022)   Hunger Vital Sign    Worried About Running Out of Food in the Last Year: Never true    Ran Out of Food in the Last Year: Never true  Transportation Needs: No  Transportation Needs (04/18/2022)   PRAPARE - Hydrologist (Medical): No    Lack of Transportation (Non-Medical): No  Physical Activity: Inactive (04/18/2022)   Exercise Vital Sign    Days of Exercise per Week: 0 days    Minutes of Exercise per Session: 0 min  Stress: No Stress Concern Present (04/18/2022)   [employer] [place]    Feeling  of Stress : Not at all  Social Connections: Moderately Isolated (04/18/2022)   Social Connection and Isolation Panel [NHANES]    Frequency of Communication with Friends and Family: More than three times a week    Frequency of Social Gatherings with Friends and Family: Once a week    Attends Religious Services: More than 4 times per year    Active Member of [employer] or Organizations: No    Attends Engineer, structural: Never    Marital Status: Never married    Tobacco Counseling Counseling given: Not Answered   Clinical Intake:  Pre-visit preparation completed: Yes  Pain : No/denies pain     Diabetes: No  How often do you need to have someone help you when you read instructions, pamphlets, or other written materials from your doctor or pharmacy?: 1 - Never  Diabetic?no  Interpreter Needed?: No  Information entered by :: Lanier [employer]   Activities of Daily Living    04/18/2022    2:21 PM  In your present state of health, do you have any difficulty performing the following activities:  Hearing? 0  Vision? 0  Difficulty concentrating or making decisions? 0  Walking or climbing stairs? 1  Comment just hold on and one a a time  Dressing or bathing? 0  Doing errands, shopping? 0  Preparing Food and eating ? N  Using the Toilet? N  In the past six months, have you accidently leaked urine? Y  Comment wears a pad  Do you have problems with loss of bowel control? N  Managing your Medications? N  Managing your Finances? N  Housekeeping or  managing your Housekeeping? N    Patient Care Team: Sherlene Shams, MD as PCP - General (Internal Medicine)  Indicate any recent Medical Services you may have received from other than [facility] providers in the past year (date may be approximate).     Assessment:   This is a routine wellness examination for Maria Torres.  Hearing/Vision screen Hearing Screening - Comments:: Pt denies any hearing issues  Vision Screening - Comments:: Pt will follow up with a new provider   Dietary issues and exercise activities discussed: Current Exercise Habits: The patient does not participate in regular exercise at present   Goals Addressed             This Visit's Progress    Patient Stated       Lose weight        Depression Screen    04/18/2022    2:18 PM 04/03/2022    4:39 PM 12/19/2021   10:09 AM 12/08/2021    2:08 PM 04/10/2021    3:59 PM 12/08/2020    1:32 PM 11/30/2020    4:32 PM  PHQ 2/9 Scores  PHQ - 2 Score 0 0 0 6 0 0 5  PHQ- 9 Score 0 0 0 19   16    Fall Risk    04/18/2022    2:20 PM 04/03/2022    4:39 PM 12/19/2021   10:09 AM 04/10/2021    3:46 PM 12/08/2020    1:32 PM  Fall Risk   Falls in the past year? 0 0 0 1 1  Number falls in past yr: 0    0  Injury with Fall? 0   0 0  Risk for fall due to : No Fall Risks;Impaired vision;Impaired balance/gait No Fall Risks No Fall Risks    Follow up Falls prevention discussed Falls evaluation completed Falls  evaluation completed Falls evaluation completed Falls evaluation completed    FALL RISK PREVENTION PERTAINING TO THE HOME:  Any stairs in or around the home? Yes  If so, are there any without handrails? No  Home free of loose throw rugs in walkways, pet beds, electrical cords, etc? Yes  Adequate lighting in your home to reduce risk of falls? Yes   ASSISTIVE DEVICES UTILIZED TO PREVENT FALLS:  Life alert? No  Use of a cane, walker or w/c? Yes  Grab bars in the bathroom? Yes  Shower chair or bench in shower? No  Elevated  toilet seat or a handicapped toilet? No   TIMED UP AND GO:  Was the test performed? No .   Cognitive Function:        04/18/2022    2:23 PM 04/10/2021    3:50 PM 02/01/2020    2:49 PM  6CIT Screen  What Year? 0 points 0 points 0 points  What month? 0 points 0 points 0 points  What time? 0 points    Count back from 20 0 points 0 points 0 points  Months in reverse 2 points 0 points 0 points  Repeat phrase 0 points 0 points 2 points  Total Score 2 points      Immunizations Immunization History  Administered Date(s) Administered   Fluad Quad(high Dose 65+) 04/01/2021   Influenza Split 05/23/2014   Influenza,inj,Quad PF,6+ Mos 04/15/2015   Influenza,inj,quad, With Preservative 03/24/2019   Moderna Sars-Covid-2 Vaccination 08/05/2019, 09/02/2019, 08/10/2020   PFIZER(Purple Top)SARS-COV-2 Vaccination 08/10/2019, 09/02/2019   PNEUMOCOCCAL CONJUGATE-20 12/08/2020   Pfizer Covid-19 Vaccine Bivalent Booster 62yrs & up 05/11/2021   Pneumococcal Conjugate-13 10/30/2015   Pneumococcal Polysaccharide-23 11/17/2013   Td (Adult),unspecified 06/17/2011   Tdap 03/19/2014   Zoster, Live 10/30/2015    TDAP status: Up to date  Flu Vaccine status: Due, Education has been provided regarding the importance of this vaccine. Advised may receive this vaccine at local pharmacy or Health Dept. Aware to provide a copy of the vaccination record if obtained from local pharmacy or Health Dept. Verbalized acceptance and understanding.  Pneumococcal vaccine status: Up to date  Covid-19 vaccine status: Completed vaccines  Qualifies for Shingles Vaccine? Yes   Zostavax completed No   Shingrix Completed?: No.    Education has been provided regarding the importance of this vaccine. Patient has been advised to call insurance company to determine out of pocket expense if they have not yet received this vaccine. Advised may also receive vaccine at local pharmacy or Health Dept. Verbalized acceptance and  understanding.  Screening Tests Health Maintenance  Topic Date Due   Zoster Vaccines- Shingrix (1 of 2) Never done   MAMMOGRAM  09/11/2020   COLONOSCOPY (Pts 45-69yrs Insurance coverage will need to be confirmed)  04/24/2021   COVID-19 Vaccine (7 - Mixed Product risk series) 04/19/2022 (Originally 07/06/2021)   INFLUENZA VACCINE  10/21/2022 (Originally 02/20/2022)   TETANUS/TDAP  03/19/2024   Pneumonia Vaccine 90+ Years old  Completed   DEXA SCAN  Completed   Hepatitis C Screening  Completed   HPV VACCINES  Aged Out    Health Maintenance  Health Maintenance Due  Topic Date Due   Zoster Vaccines- Shingrix (1 of 2) Never done   MAMMOGRAM  09/11/2020   COLONOSCOPY (Pts 45-47yrs Insurance coverage will need to be confirmed)  04/24/2021    Colorectal cancer screening: Type of screening: Colonoscopy. Completed 04/25/11. Repeat every 10 years pt stated she would have it done in [place]  Mammogram status: Completed 09/12/19. Repeat every year  Bone Density status: Completed 09/14/19. Results reflect: Bone density results: OSTEOPOROSIS. Repeat every 2 years.   Additional Screening:  Hepatitis C Screening:  Completed 09/10/19  Vision Screening: Recommended annual ophthalmology exams for early detection of glaucoma and other disorders of the eye. Is the patient up to date with their annual eye exam?  No  Who is the provider or what is the name of the office in which the patient attends annual eye exams? Will locate a new provider  If pt is not established with a provider, would they like to be referred to a provider to establish care? No .   Dental Screening: Recommended annual dental exams for proper oral hygiene  Community Resource Referral / Chronic Care Management: CRR required this visit?  No   CCM required this visit?  No      Plan:     I have personally reviewed and noted the following in the patient's chart:   Medical and social history Use of alcohol, tobacco or  illicit drugs  Current medications and supplements including opioid prescriptions. Patient is currently taking opioid prescriptions. Information provided to patient regarding non-opioid alternatives. Patient advised to discuss non-opioid treatment plan with their provider. Functional ability and status Nutritional status Physical activity Advanced directives List of other physicians Hospitalizations, surgeries, and ER visits in previous 12 months Vitals Screenings to include cognitive, depression, and falls Referrals and appointments  In addition, I have reviewed and discussed with patient certain preventive protocols, quality metrics, and best practice recommendations. A written personalized care plan for preventive services as well as general preventive health recommendations were provided to patient.     Marzella Schleinina H Bona Hubbard, LPN   [id number]/2023   Nurse Notes: none     I have reviewed the above information and agree with above.   Duncan Dulleresa Tullo, MD

## 2022-04-18 NOTE — Patient Instructions (Signed)
Maria Torres , Thank you for taking time to come for your Medicare Wellness Visit. I appreciate your ongoing commitment to your health goals. Please review the following plan we discussed and let me know if I can assist you in the future.   These are the goals we discussed:  Goals       I want to prevent falls (pt-stated)      Fear of falling, cane/walker in use as needed Balance exercises      Low salt diet intake (pt-stated)      Healthy diet      Patient Stated      Lose weight         This is a list of the screening recommended for you and due dates:  Health Maintenance  Topic Date Due   Zoster (Shingles) Vaccine (1 of 2) Never done   Mammogram  09/11/2020   Colon Cancer Screening  04/24/2021   COVID-19 Vaccine (7 - Mixed Product risk series) 04/19/2022*   Flu Shot  10/21/2022*   Tetanus Vaccine  03/19/2024   Pneumonia Vaccine  Completed   DEXA scan (bone density measurement)  Completed   Hepatitis C Screening: USPSTF Recommendation to screen - Ages 38-79 yo.  Completed   HPV Vaccine  Aged Out  *Topic was postponed. The date shown is not the original due date.    Advanced directives: Advance directive discussed with you today. I have provided a copy for you to complete at home and have notarized. Once this is complete please bring a copy in to our office so we can scan it into your chart.   Conditions/risks identified: lose weight   Next appointment: Follow up in one year for your annual wellness visit    Preventive Care 65 Years and Older, Female Preventive care refers to lifestyle choices and visits with your health care provider that can promote health and wellness. What does preventive care include? A yearly physical exam. This is also called an annual well check. Dental exams once or twice a year. Routine eye exams. Ask your health care provider how often you should have your eyes checked. Personal lifestyle choices, including: Daily care of your teeth and  gums. Regular physical activity. Eating a healthy diet. Avoiding tobacco and drug use. Limiting alcohol use. Practicing safe sex. Taking low-dose aspirin every day. Taking vitamin and mineral supplements as recommended by your health care provider. What happens during an annual well check? The services and screenings done by your health care provider during your annual well check will depend on your age, overall health, lifestyle risk factors, and family history of disease. Counseling  Your health care provider may ask you questions about your: Alcohol use. Tobacco use. Drug use. Emotional well-being. Home and relationship well-being. Sexual activity. Eating habits. History of falls. Memory and ability to understand (cognition). Work and work Statistician. Reproductive health. Screening  You may have the following tests or measurements: Height, weight, and BMI. Blood pressure. Lipid and cholesterol levels. These may be checked every 5 years, or more frequently if you are over 92 years old. Skin check. Lung cancer screening. You may have this screening every year starting at age 73 if you have a 30-pack-year history of smoking and currently smoke or have quit within the past 15 years. Fecal occult blood test (FOBT) of the stool. You may have this test every year starting at age 21. Flexible sigmoidoscopy or colonoscopy. You may have a sigmoidoscopy every 5 years or a  colonoscopy every 10 years starting at age 50. Hepatitis C blood test. Hepatitis B blood test. Sexually transmitted disease (STD) testing. Diabetes screening. This is done by checking your blood sugar (glucose) after you have not eaten for a while (fasting). You may have this done every 1-3 years. Bone density scan. This is done to screen for osteoporosis. You may have this done starting at age 66. Mammogram. This may be done every 1-2 years. Talk to your health care provider about how often you should have regular  mammograms. Talk with your health care provider about your test results, treatment options, and if necessary, the need for more tests. Vaccines  Your health care provider may recommend certain vaccines, such as: Influenza vaccine. This is recommended every year. Tetanus, diphtheria, and acellular pertussis (Tdap, Td) vaccine. You may need a Td booster every 10 years. Zoster vaccine. You may need this after age 23. Pneumococcal 13-valent conjugate (PCV13) vaccine. One dose is recommended after age 39. Pneumococcal polysaccharide (PPSV23) vaccine. One dose is recommended after age 53. Talk to your health care provider about which screenings and vaccines you need and how often you need them. This information is not intended to replace advice given to you by your health care provider. Make sure you discuss any questions you have with your health care provider. Document Released: 08/05/2015 Document Revised: 03/28/2016 Document Reviewed: 05/10/2015 Elsevier Interactive Patient Education  2017 [place] Prevention in the Home Falls can cause injuries. They can happen to people of all ages. There are many things you can do to make your home safe and to help prevent falls. What can I do on the outside of my home? Regularly fix the edges of walkways and driveways and fix any cracks. Remove anything that might make you trip as you walk through a door, such as a raised step or threshold. Trim any bushes or trees on the path to your home. Use bright outdoor lighting. Clear any walking paths of anything that might make someone trip, such as rocks or tools. Regularly check to see if handrails are loose or broken. Make sure that both sides of any steps have handrails. Any raised decks and porches should have guardrails on the edges. Have any leaves, snow, or ice cleared regularly. Use sand or salt on walking paths during winter. Clean up any spills in your garage right away. This includes oil  or grease spills. What can I do in the bathroom? Use night lights. Install grab bars by the toilet and in the tub and shower. Do not use towel bars as grab bars. Use non-skid mats or decals in the tub or shower. If you need to sit down in the shower, use a plastic, non-slip stool. Keep the floor dry. Clean up any water that spills on the floor as soon as it happens. Remove soap buildup in the tub or shower regularly. Attach bath mats securely with double-sided non-slip rug tape. Do not have throw rugs and other things on the floor that can make you trip. What can I do in the bedroom? Use night lights. Make sure that you have a light by your bed that is easy to reach. Do not use any sheets or blankets that are too big for your bed. They should not hang down onto the floor. Have a firm chair that has side arms. You can use this for support while you get dressed. Do not have throw rugs and other things on the floor that can  make you trip. What can I do in the kitchen? Clean up any spills right away. Avoid walking on wet floors. Keep items that you use a lot in easy-to-reach places. If you need to reach something above you, use a strong step stool that has a grab bar. Keep electrical cords out of the way. Do not use floor polish or wax that makes floors slippery. If you must use wax, use non-skid floor wax. Do not have throw rugs and other things on the floor that can make you trip. What can I do with my stairs? Do not leave any items on the stairs. Make sure that there are handrails on both sides of the stairs and use them. Fix handrails that are broken or loose. Make sure that handrails are as long as the stairways. Check any carpeting to make sure that it is firmly attached to the stairs. Fix any carpet that is loose or worn. Avoid having throw rugs at the top or bottom of the stairs. If you do have throw rugs, attach them to the floor with carpet tape. Make sure that you have a light  switch at the top of the stairs and the bottom of the stairs. If you do not have them, ask someone to add them for you. What else can I do to help prevent falls? Wear shoes that: Do not have high heels. Have rubber bottoms. Are comfortable and fit you well. Are closed at the toe. Do not wear sandals. If you use a stepladder: Make sure that it is fully opened. Do not climb a closed stepladder. Make sure that both sides of the stepladder are locked into place. Ask someone to hold it for you, if possible. Clearly mark and make sure that you can see: Any grab bars or handrails. First and last steps. Where the edge of each step is. Use tools that help you move around (mobility aids) if they are needed. These include: Canes. Walkers. Scooters. Crutches. Turn on the lights when you go into a dark area. Replace any light bulbs as soon as they burn out. Set up your furniture so you have a clear path. Avoid moving your furniture around. If any of your floors are uneven, fix them. If there are any pets around you, be aware of where they are. Review your medicines with your doctor. Some medicines can make you feel dizzy. This can increase your chance of falling. Ask your doctor what other things that you can do to help prevent falls. This information is not intended to replace advice given to you by your health care provider. Make sure you discuss any questions you have with your health care provider. Document Released: 05/05/2009 Document Revised: 12/15/2015 Document Reviewed: 08/13/2014 Elsevier Interactive Patient Education  2017 [employer].

## 2022-04-24 DIAGNOSIS — Z8774 Personal history of (corrected) congenital malformations of heart and circulatory system: Secondary | ICD-10-CM | POA: Diagnosis not present

## 2022-05-16 DIAGNOSIS — Z23 Encounter for immunization: Secondary | ICD-10-CM | POA: Diagnosis not present

## 2022-05-16 DIAGNOSIS — F330 Major depressive disorder, recurrent, mild: Secondary | ICD-10-CM | POA: Diagnosis not present

## 2022-05-20 ENCOUNTER — Telehealth: Payer: Self-pay | Admitting: *Deleted

## 2022-05-20 NOTE — Telephone Encounter (Signed)
$  40 copay due for Prolia PT due on or after 06/10/22 PA initiated via covermymeds [employer]

## 2022-05-20 NOTE — Telephone Encounter (Signed)
PA already on file

## 2022-05-22 DIAGNOSIS — Z8774 Personal history of (corrected) congenital malformations of heart and circulatory system: Secondary | ICD-10-CM | POA: Diagnosis not present

## 2022-05-29 DIAGNOSIS — Z1231 Encounter for screening mammogram for malignant neoplasm of breast: Secondary | ICD-10-CM | POA: Diagnosis not present

## 2022-05-29 DIAGNOSIS — M810 Age-related osteoporosis without current pathological fracture: Secondary | ICD-10-CM | POA: Diagnosis not present

## 2022-05-29 DIAGNOSIS — F330 Major depressive disorder, recurrent, mild: Secondary | ICD-10-CM | POA: Diagnosis not present

## 2022-06-05 DIAGNOSIS — Z8774 Personal history of (corrected) congenital malformations of heart and circulatory system: Secondary | ICD-10-CM | POA: Diagnosis not present

## 2022-06-11 ENCOUNTER — Telehealth: Payer: Self-pay

## 2022-06-11 DIAGNOSIS — M810 Age-related osteoporosis without current pathological fracture: Secondary | ICD-10-CM | POA: Diagnosis not present

## 2022-06-11 NOTE — Telephone Encounter (Signed)
Date of last Prolia injection was on 519/23. Sent mychart message to inform patient.   PA# [id number]  Effective: 07/23/21-07/22/22

## 2022-06-11 NOTE — Telephone Encounter (Signed)
Patient states Maria Torres called her from [place], [place]'s office to state they need information regarding patient's prolia shot.  Patient requests that we please call Maria Torres at [phone].

## 2022-06-19 ENCOUNTER — Other Ambulatory Visit: Payer: Self-pay | Admitting: Internal Medicine

## 2022-06-19 NOTE — Telephone Encounter (Signed)
Is it okay to refuse the 150 mcg dose. Pt was changed to the 125 mcg per her last lab result.

## 2022-06-20 DIAGNOSIS — M810 Age-related osteoporosis without current pathological fracture: Secondary | ICD-10-CM | POA: Diagnosis not present

## 2022-06-29 ENCOUNTER — Other Ambulatory Visit: Payer: Self-pay

## 2022-06-29 MED ORDER — ARIPIPRAZOLE 5 MG PO TABS: 5.0000 mg | ORAL_TABLET | Freq: Every day | ORAL | 2 refills | Status: AC

## 2022-07-04 NOTE — Telephone Encounter (Signed)
Abstraction  

## 2022-07-04 NOTE — Telephone Encounter (Signed)
MyChart messgae sent to patient. 

## 2022-07-06 DIAGNOSIS — F330 Major depressive disorder, recurrent, mild: Secondary | ICD-10-CM | POA: Diagnosis not present

## 2022-08-23 ENCOUNTER — Other Ambulatory Visit: Payer: Self-pay

## 2022-08-23 MED ORDER — ALLOPURINOL 100 MG PO TABS: ORAL_TABLET | ORAL | 1 refills | Status: AC

## 2023-05-14 ENCOUNTER — Other Ambulatory Visit: Payer: Self-pay | Admitting: Family

## 2023-05-14 ENCOUNTER — Other Ambulatory Visit: Payer: Self-pay | Admitting: Internal Medicine

## 2023-10-14 ENCOUNTER — Telehealth: Payer: Self-pay | Admitting: *Deleted
